# Patient Record
Sex: Female | Born: 1945 | Race: White | Hispanic: No | Smoking: Never smoker
Health system: Southern US, Community
[De-identification: ages and names within clinical notes are randomized; demographics above are authoritative.]

## PROBLEM LIST (undated history)

## (undated) DIAGNOSIS — F32A Depression, unspecified: Secondary | ICD-10-CM

## (undated) DIAGNOSIS — M8580 Other specified disorders of bone density and structure, unspecified site: Secondary | ICD-10-CM

## (undated) DIAGNOSIS — E079 Disorder of thyroid, unspecified: Secondary | ICD-10-CM

## (undated) DIAGNOSIS — J449 Chronic obstructive pulmonary disease, unspecified: Secondary | ICD-10-CM

## (undated) DIAGNOSIS — F329 Major depressive disorder, single episode, unspecified: Secondary | ICD-10-CM

## (undated) DIAGNOSIS — I10 Essential (primary) hypertension: Secondary | ICD-10-CM

## (undated) DIAGNOSIS — M797 Fibromyalgia: Secondary | ICD-10-CM

## (undated) DIAGNOSIS — K219 Gastro-esophageal reflux disease without esophagitis: Secondary | ICD-10-CM

## (undated) DIAGNOSIS — K5790 Diverticulosis of intestine, part unspecified, without perforation or abscess without bleeding: Secondary | ICD-10-CM

## (undated) DIAGNOSIS — T7840XA Allergy, unspecified, initial encounter: Secondary | ICD-10-CM

## (undated) DIAGNOSIS — E785 Hyperlipidemia, unspecified: Secondary | ICD-10-CM

## (undated) DIAGNOSIS — R002 Palpitations: Secondary | ICD-10-CM

## (undated) DIAGNOSIS — C50919 Malignant neoplasm of unspecified site of unspecified female breast: Secondary | ICD-10-CM

## (undated) DIAGNOSIS — M1990 Unspecified osteoarthritis, unspecified site: Secondary | ICD-10-CM

## (undated) DIAGNOSIS — F419 Anxiety disorder, unspecified: Secondary | ICD-10-CM

## (undated) HISTORY — DX: Disorder of thyroid, unspecified: E07.9

## (undated) HISTORY — DX: Allergy, unspecified, initial encounter: T78.40XA

## (undated) HISTORY — DX: Gastro-esophageal reflux disease without esophagitis: K21.9

## (undated) HISTORY — PX: APPENDECTOMY: SHX54

## (undated) HISTORY — DX: Depression, unspecified: F32.A

## (undated) HISTORY — DX: Unspecified osteoarthritis, unspecified site: M19.90

## (undated) HISTORY — DX: Hyperlipidemia, unspecified: E78.5

## (undated) HISTORY — PX: CATARACT EXTRACTION: SUR2

## (undated) HISTORY — DX: Major depressive disorder, single episode, unspecified: F32.9

## (undated) HISTORY — DX: Chronic obstructive pulmonary disease, unspecified: J44.9

## (undated) HISTORY — DX: Diverticulosis of intestine, part unspecified, without perforation or abscess without bleeding: K57.90

## (undated) HISTORY — DX: Fibromyalgia: M79.7

## (undated) HISTORY — DX: Essential (primary) hypertension: I10

## (undated) HISTORY — DX: Anxiety disorder, unspecified: F41.9

## (undated) HISTORY — PX: ABDOMINAL HYSTERECTOMY: SHX81

## (undated) HISTORY — DX: Other specified disorders of bone density and structure, unspecified site: M85.80

## (undated) HISTORY — DX: Malignant neoplasm of unspecified site of unspecified female breast: C50.919

---

## 1985-06-28 HISTORY — PX: MASTECTOMY: SHX3

## 2001-06-28 LAB — CONVERTED CEMR LAB

## 2004-06-28 LAB — HM COLONOSCOPY

## 2007-06-09 ENCOUNTER — Encounter: Payer: Self-pay | Admitting: Emergency Medicine

## 2007-06-10 ENCOUNTER — Inpatient Hospital Stay (HOSPITAL_COMMUNITY): Admission: EM | Admit: 2007-06-10 | Discharge: 2007-06-12 | Payer: Self-pay | Admitting: Internal Medicine

## 2007-06-10 ENCOUNTER — Ambulatory Visit: Payer: Self-pay | Admitting: Cardiovascular Disease

## 2007-06-12 ENCOUNTER — Encounter: Payer: Self-pay | Admitting: Internal Medicine

## 2007-07-17 ENCOUNTER — Ambulatory Visit: Payer: Self-pay | Admitting: Cardiology

## 2007-08-03 ENCOUNTER — Ambulatory Visit: Payer: Self-pay | Admitting: Cardiology

## 2007-08-03 ENCOUNTER — Ambulatory Visit: Payer: Self-pay

## 2007-08-03 LAB — CONVERTED CEMR LAB
ALT: 44 units/L — ABNORMAL HIGH (ref 0–35)
AST: 26 units/L (ref 0–37)
Total Protein: 6.5 g/dL (ref 6.0–8.3)

## 2007-09-14 ENCOUNTER — Ambulatory Visit: Payer: Self-pay | Admitting: Cardiology

## 2007-09-14 LAB — CONVERTED CEMR LAB
Chloride: 100 meq/L (ref 96–112)
Creatinine, Ser: 0.8 mg/dL (ref 0.4–1.2)
GFR calc Af Amer: 93 mL/min
GFR calc non Af Amer: 77 mL/min
Glucose, Bld: 100 mg/dL — ABNORMAL HIGH (ref 70–99)
Sodium: 140 meq/L (ref 135–145)

## 2007-09-22 ENCOUNTER — Ambulatory Visit: Payer: Self-pay | Admitting: Cardiology

## 2007-11-27 ENCOUNTER — Ambulatory Visit: Payer: Self-pay | Admitting: Internal Medicine

## 2007-11-27 DIAGNOSIS — Z853 Personal history of malignant neoplasm of breast: Secondary | ICD-10-CM | POA: Insufficient documentation

## 2007-11-27 DIAGNOSIS — I10 Essential (primary) hypertension: Secondary | ICD-10-CM | POA: Insufficient documentation

## 2007-11-27 DIAGNOSIS — K219 Gastro-esophageal reflux disease without esophagitis: Secondary | ICD-10-CM

## 2007-11-27 DIAGNOSIS — J309 Allergic rhinitis, unspecified: Secondary | ICD-10-CM | POA: Insufficient documentation

## 2007-11-28 LAB — CONVERTED CEMR LAB
ALT: 34 units/L (ref 0–35)
AST: 24 units/L (ref 0–37)
Albumin: 4 g/dL (ref 3.5–5.2)
Alkaline Phosphatase: 123 units/L — ABNORMAL HIGH (ref 39–117)
BUN: 11 mg/dL (ref 6–23)
Basophils Relative: 0.4 % (ref 0.0–1.0)
Cholesterol: 298 mg/dL (ref 0–200)
Direct LDL: 220 mg/dL
Eosinophils Absolute: 0.1 10*3/uL (ref 0.0–0.7)
Eosinophils Relative: 2.5 % (ref 0.0–5.0)
GFR calc non Af Amer: 77 mL/min
Glucose, Bld: 89 mg/dL (ref 70–99)
MCV: 90.7 fL (ref 78.0–100.0)
Monocytes Absolute: 0.4 10*3/uL (ref 0.1–1.0)
Neutrophils Relative %: 69.6 % (ref 43.0–77.0)
Platelets: 183 10*3/uL (ref 150–400)
Potassium: 3.8 meq/L (ref 3.5–5.1)
Total Bilirubin: 1.1 mg/dL (ref 0.3–1.2)
Total CHOL/HDL Ratio: 7.3
Total Protein: 6.9 g/dL (ref 6.0–8.3)
Triglycerides: 172 mg/dL — ABNORMAL HIGH (ref 0–149)
WBC: 4.5 10*3/uL (ref 4.5–10.5)

## 2008-01-30 ENCOUNTER — Ambulatory Visit: Payer: Self-pay | Admitting: Internal Medicine

## 2008-01-30 DIAGNOSIS — E785 Hyperlipidemia, unspecified: Secondary | ICD-10-CM

## 2008-01-30 DIAGNOSIS — F329 Major depressive disorder, single episode, unspecified: Secondary | ICD-10-CM

## 2008-02-02 LAB — CONVERTED CEMR LAB
ALT: 30 units/L (ref 0–35)
AST: 24 units/L (ref 0–37)
Albumin: 3.9 g/dL (ref 3.5–5.2)
Alkaline Phosphatase: 122 units/L — ABNORMAL HIGH (ref 39–117)
Bilirubin, Direct: 0.1 mg/dL (ref 0.0–0.3)
Cholesterol: 316 mg/dL (ref 0–200)
Direct LDL: 210.5 mg/dL
HDL: 43.9 mg/dL (ref >39.0)
Total Bilirubin: 0.9 mg/dL (ref 0.3–1.2)
Total CHOL/HDL Ratio: 7.2
Total Protein: 6.5 g/dL (ref 6.0–8.3)
Triglycerides: 307 mg/dL (ref 0–149)
VLDL: 61 mg/dL — ABNORMAL HIGH (ref 0–40)

## 2008-06-05 ENCOUNTER — Ambulatory Visit: Payer: Self-pay | Admitting: Internal Medicine

## 2008-06-12 LAB — CONVERTED CEMR LAB
Basophils Absolute: 0.1 10*3/uL (ref 0.0–0.1)
Eosinophils Absolute: 0.1 10*3/uL (ref 0.0–0.7)
Eosinophils Relative: 1.2 % (ref 0.0–5.0)
GFR calc Af Amer: 109 mL/min
Glucose, Bld: 94 mg/dL (ref 70–99)
Lymphocytes Relative: 15.3 % (ref 12.0–46.0)
MCHC: 36 g/dL (ref 30.0–36.0)
MCV: 89.8 fL (ref 78.0–100.0)
Monocytes Absolute: 0.5 10*3/uL (ref 0.1–1.0)
Neutro Abs: 3.9 10*3/uL (ref 1.4–7.7)
Neutrophils Relative %: 72.8 % (ref 43.0–77.0)
Platelets: 130 10*3/uL — ABNORMAL LOW (ref 150–400)
WBC: 5.4 10*3/uL (ref 4.5–10.5)

## 2008-08-19 ENCOUNTER — Ambulatory Visit: Payer: Self-pay | Admitting: Internal Medicine

## 2008-08-21 LAB — CONVERTED CEMR LAB
Albumin: 4 g/dL (ref 3.5–5.2)
Alkaline Phosphatase: 106 units/L (ref 39–117)
CO2: 27 meq/L (ref 19–32)
Cholesterol: 283 mg/dL (ref 0–200)
Creatinine, Ser: 0.7 mg/dL (ref 0.4–1.2)
Direct LDL: 192.5 mg/dL
GFR calc Af Amer: 108 mL/min
Glucose, Bld: 89 mg/dL (ref 70–99)
HDL: 41.6 mg/dL (ref >39.0)
Potassium: 3.2 meq/L — ABNORMAL LOW (ref 3.5–5.1)
Sodium: 140 meq/L (ref 135–145)
Total Bilirubin: 1 mg/dL (ref 0.3–1.2)
Total CHOL/HDL Ratio: 6.8

## 2008-11-05 ENCOUNTER — Encounter: Payer: Self-pay | Admitting: Internal Medicine

## 2009-01-02 ENCOUNTER — Ambulatory Visit: Payer: Self-pay | Admitting: Internal Medicine

## 2009-01-03 LAB — CONVERTED CEMR LAB
ALT: 57 units/L — ABNORMAL HIGH (ref 0–35)
CO2: 27 meq/L (ref 19–32)
Calcium: 8.9 mg/dL (ref 8.4–10.5)
Chloride: 105 meq/L (ref 96–112)
Eosinophils Relative: 2.2 % (ref 0.0–5.0)
Glucose, Bld: 83 mg/dL (ref 70–99)
HCT: 41.3 % (ref 36.0–46.0)
HDL: 45.5 mg/dL (ref >39.00)
Hemoglobin: 14.5 g/dL (ref 12.0–15.0)
Lymphocytes Relative: 15.6 % (ref 12.0–46.0)
Lymphs Abs: 0.8 10*3/uL (ref 0.7–4.0)
Monocytes Relative: 9.5 % (ref 3.0–12.0)
Platelets: 149 10*3/uL — ABNORMAL LOW (ref 150.0–400.0)
Sodium: 140 meq/L (ref 135–145)
TSH: 2.02 microintl units/mL (ref 0.35–5.50)
Total Bilirubin: 1 mg/dL (ref 0.3–1.2)
Total CHOL/HDL Ratio: 7
Triglycerides: 363 mg/dL — ABNORMAL HIGH (ref 0.0–149.0)
WBC: 5.2 10*3/uL (ref 4.5–10.5)

## 2009-02-12 ENCOUNTER — Ambulatory Visit: Payer: Self-pay | Admitting: Gastroenterology

## 2009-02-18 ENCOUNTER — Telehealth: Payer: Self-pay | Admitting: Gastroenterology

## 2009-02-26 ENCOUNTER — Ambulatory Visit: Payer: Self-pay | Admitting: Gastroenterology

## 2009-05-28 ENCOUNTER — Ambulatory Visit: Payer: Self-pay | Admitting: Internal Medicine

## 2009-06-03 ENCOUNTER — Telehealth (INDEPENDENT_AMBULATORY_CARE_PROVIDER_SITE_OTHER): Payer: Self-pay | Admitting: *Deleted

## 2009-06-04 ENCOUNTER — Encounter (HOSPITAL_COMMUNITY): Admission: RE | Admit: 2009-06-04 | Discharge: 2009-07-29 | Payer: Self-pay | Admitting: Internal Medicine

## 2009-06-04 ENCOUNTER — Ambulatory Visit: Payer: Self-pay | Admitting: Cardiology

## 2009-06-04 ENCOUNTER — Ambulatory Visit: Payer: Self-pay

## 2009-06-25 ENCOUNTER — Ambulatory Visit: Payer: Self-pay | Admitting: Internal Medicine

## 2009-06-26 LAB — CONVERTED CEMR LAB
AST: 24 units/L (ref 0–37)
Albumin: 4 g/dL (ref 3.5–5.2)
CO2: 29 meq/L (ref 19–32)
Chloride: 104 meq/L (ref 96–112)
Direct LDL: 176.4 mg/dL
Glucose, Bld: 87 mg/dL (ref 70–99)
HDL: 42.6 mg/dL (ref >39.00)
Potassium: 3.2 meq/L — ABNORMAL LOW (ref 3.5–5.1)
Sodium: 142 meq/L (ref 135–145)
Total Bilirubin: 0.8 mg/dL (ref 0.3–1.2)
Total CHOL/HDL Ratio: 6
Triglycerides: 337 mg/dL — ABNORMAL HIGH (ref 0.0–149.0)
VLDL: 67.4 mg/dL — ABNORMAL HIGH (ref 0.0–40.0)

## 2009-08-08 ENCOUNTER — Ambulatory Visit: Payer: Self-pay | Admitting: Internal Medicine

## 2009-08-08 LAB — CONVERTED CEMR LAB
Albumin: 3.8 g/dL (ref 3.5–5.2)
Cholesterol: 217 mg/dL — ABNORMAL HIGH (ref 0–200)
Direct LDL: 122.6 mg/dL
Total CHOL/HDL Ratio: 4
Total Protein: 6.5 g/dL (ref 6.0–8.3)
Triglycerides: 279 mg/dL — ABNORMAL HIGH (ref 0.0–149.0)
VLDL: 55.8 mg/dL — ABNORMAL HIGH (ref 0.0–40.0)

## 2009-10-31 ENCOUNTER — Ambulatory Visit: Payer: Self-pay | Admitting: Internal Medicine

## 2009-10-31 DIAGNOSIS — J4489 Other specified chronic obstructive pulmonary disease: Secondary | ICD-10-CM | POA: Insufficient documentation

## 2009-10-31 DIAGNOSIS — J449 Chronic obstructive pulmonary disease, unspecified: Secondary | ICD-10-CM

## 2009-10-31 LAB — CONVERTED CEMR LAB
ALT: 20 units/L (ref 0–35)
AST: 19 units/L (ref 0–37)
Basophils Relative: 0.8 % (ref 0.0–3.0)
Bilirubin, Direct: 0.1 mg/dL (ref 0.0–0.3)
Calcium: 9.4 mg/dL (ref 8.4–10.5)
Chloride: 103 meq/L (ref 96–112)
Creatinine, Ser: 0.7 mg/dL (ref 0.4–1.2)
Eosinophils Absolute: 0.1 10*3/uL (ref 0.0–0.7)
Eosinophils Relative: 1.9 % (ref 0.0–5.0)
GFR calc non Af Amer: 87.75 mL/min (ref >60)
HCT: 40.2 % (ref 36.0–46.0)
Lymphs Abs: 0.8 10*3/uL (ref 0.7–4.0)
MCHC: 34.8 g/dL (ref 30.0–36.0)
MCV: 91.4 fL (ref 78.0–100.0)
Monocytes Absolute: 0.5 10*3/uL (ref 0.1–1.0)
Neutrophils Relative %: 75.2 % (ref 43.0–77.0)
Potassium: 3.3 meq/L — ABNORMAL LOW (ref 3.5–5.1)
RBC: 4.39 M/uL (ref 3.87–5.11)
Total Bilirubin: 0.7 mg/dL (ref 0.3–1.2)
WBC: 5.9 10*3/uL (ref 4.5–10.5)

## 2009-11-20 ENCOUNTER — Ambulatory Visit: Payer: Self-pay | Admitting: Internal Medicine

## 2009-11-21 ENCOUNTER — Encounter: Payer: Self-pay | Admitting: Internal Medicine

## 2009-12-02 ENCOUNTER — Telehealth: Payer: Self-pay | Admitting: Internal Medicine

## 2009-12-02 ENCOUNTER — Ambulatory Visit: Payer: Self-pay | Admitting: Internal Medicine

## 2009-12-04 ENCOUNTER — Telehealth: Payer: Self-pay | Admitting: Internal Medicine

## 2009-12-22 ENCOUNTER — Telehealth: Payer: Self-pay | Admitting: Internal Medicine

## 2009-12-25 ENCOUNTER — Telehealth: Payer: Self-pay | Admitting: Internal Medicine

## 2009-12-30 ENCOUNTER — Ambulatory Visit: Payer: Self-pay | Admitting: Internal Medicine

## 2010-01-01 ENCOUNTER — Encounter (INDEPENDENT_AMBULATORY_CARE_PROVIDER_SITE_OTHER): Payer: Self-pay | Admitting: *Deleted

## 2010-02-05 ENCOUNTER — Ambulatory Visit: Payer: Self-pay | Admitting: Gastroenterology

## 2010-02-05 ENCOUNTER — Encounter (INDEPENDENT_AMBULATORY_CARE_PROVIDER_SITE_OTHER): Payer: Self-pay | Admitting: *Deleted

## 2010-02-05 ENCOUNTER — Telehealth: Payer: Self-pay | Admitting: Gastroenterology

## 2010-02-05 DIAGNOSIS — K5730 Diverticulosis of large intestine without perforation or abscess without bleeding: Secondary | ICD-10-CM | POA: Insufficient documentation

## 2010-02-06 ENCOUNTER — Encounter (INDEPENDENT_AMBULATORY_CARE_PROVIDER_SITE_OTHER): Payer: Self-pay | Admitting: *Deleted

## 2010-02-06 ENCOUNTER — Emergency Department (HOSPITAL_COMMUNITY): Admission: EM | Admit: 2010-02-06 | Discharge: 2010-02-06 | Payer: Self-pay | Admitting: Emergency Medicine

## 2010-02-16 ENCOUNTER — Telehealth: Payer: Self-pay | Admitting: Gastroenterology

## 2010-02-25 ENCOUNTER — Telehealth: Payer: Self-pay | Admitting: Gastroenterology

## 2010-02-27 ENCOUNTER — Ambulatory Visit: Payer: Self-pay | Admitting: Gastroenterology

## 2010-03-04 ENCOUNTER — Encounter: Payer: Self-pay | Admitting: Gastroenterology

## 2010-04-03 ENCOUNTER — Ambulatory Visit: Payer: Self-pay | Admitting: Internal Medicine

## 2010-04-07 LAB — CONVERTED CEMR LAB
ALT: 23 units/L (ref 0–35)
AST: 23 units/L (ref 0–37)
Albumin: 4 g/dL (ref 3.5–5.2)
Alkaline Phosphatase: 103 units/L (ref 39–117)
BUN: 13 mg/dL (ref 6–23)
Basophils Absolute: 0 10*3/uL (ref 0.0–0.1)
Basophils Relative: 0.4 % (ref 0.0–3.0)
Bilirubin, Direct: 0.1 mg/dL (ref 0.0–0.3)
CO2: 28 meq/L (ref 19–32)
Calcium: 9.4 mg/dL (ref 8.4–10.5)
Chloride: 106 meq/L (ref 96–112)
Cholesterol: 275 mg/dL — ABNORMAL HIGH (ref 0–200)
Creatinine, Ser: 0.8 mg/dL (ref 0.4–1.2)
Direct LDL: 174.8 mg/dL
Eosinophils Absolute: 0.1 10*3/uL (ref 0.0–0.7)
Eosinophils Relative: 1.5 % (ref 0.0–5.0)
GFR calc non Af Amer: 81.01 mL/min (ref >60)
Glucose, Bld: 104 mg/dL — ABNORMAL HIGH (ref 70–99)
HCT: 40 % (ref 36.0–46.0)
HDL: 42.5 mg/dL (ref >39.00)
Hemoglobin: 13.8 g/dL (ref 12.0–15.0)
Lymphocytes Relative: 15.7 % (ref 12.0–46.0)
Lymphs Abs: 0.9 10*3/uL (ref 0.7–4.0)
MCHC: 34.5 g/dL (ref 30.0–36.0)
MCV: 91.5 fL (ref 78.0–100.0)
Monocytes Absolute: 0.5 10*3/uL (ref 0.1–1.0)
Monocytes Relative: 9.2 % (ref 3.0–12.0)
Neutro Abs: 4.3 10*3/uL (ref 1.4–7.7)
Neutrophils Relative %: 73.2 % (ref 43.0–77.0)
Platelets: 198 10*3/uL (ref 150.0–400.0)
Potassium: 3.3 meq/L — ABNORMAL LOW (ref 3.5–5.1)
RBC: 4.38 M/uL (ref 3.87–5.11)
RDW: 13.2 % (ref 11.5–14.6)
Sodium: 143 meq/L (ref 135–145)
TSH: 1.35 microintl units/mL (ref 0.35–5.50)
Total Bilirubin: 0.6 mg/dL (ref 0.3–1.2)
Total CHOL/HDL Ratio: 6
Total Protein: 6.4 g/dL (ref 6.0–8.3)
Triglycerides: 499 mg/dL — ABNORMAL HIGH (ref 0.0–149.0)
VLDL: 99.8 mg/dL — ABNORMAL HIGH (ref 0.0–40.0)
WBC: 5.9 10*3/uL (ref 4.5–10.5)

## 2010-05-13 ENCOUNTER — Ambulatory Visit: Payer: Self-pay | Admitting: Internal Medicine

## 2010-05-13 LAB — CONVERTED CEMR LAB
Bilirubin Urine: NEGATIVE
Ketones, urine, test strip: NEGATIVE
Nitrite: NEGATIVE
Protein, U semiquant: NEGATIVE

## 2010-05-14 LAB — CONVERTED CEMR LAB
ALT: 23 units/L (ref 0–35)
Alkaline Phosphatase: 108 units/L (ref 39–117)
Bilirubin, Direct: 0.1 mg/dL (ref 0.0–0.3)
Total CHOL/HDL Ratio: 4
Total Protein: 6.2 g/dL (ref 6.0–8.3)

## 2010-07-03 ENCOUNTER — Emergency Department (HOSPITAL_COMMUNITY)
Admission: EM | Admit: 2010-07-03 | Discharge: 2010-07-03 | Payer: Self-pay | Source: Home / Self Care | Admitting: Emergency Medicine

## 2010-07-28 NOTE — Letter (Signed)
Summary: Patient [facility] Biopsy Results  [facility]  [address] [place], [place] [address]   Phone: [phone]  Fax: [phone]        March 04, 2010 MRN: [id number]    [facility] [address] [place], [place]  [address]    Dear Ms. HUDGINS,  I am pleased to inform you that the biopsies taken during your recent endoscopic examination did not show any evidence of cancer upon pathologic examination.  Additional information/recommendations:  __No further action is needed at this time.  Please follow-up with      your primary care physician for your other healthcare needs.  __ Please call [phone] to schedule a return visit to review      your condition.  X__ Continue with the treatment plan as outlined on the day of your      exam.bIOPSY FOR H.PYLORI WAS NEGATIVE.  __ You should have a repeat endoscopic examination for this problem              in _ months/years.   Please call us if you are having persistent problems or have questions about your condition that have not been fully answered at this time.  Sincerely,  Mardella Layman MD [facility]  This letter has been electronically signed by your physician.  Appended Document: Patient Notice-Endo Biopsy Results letter mailed

## 2010-07-28 NOTE — Assessment & Plan Note (Signed)
Summary: CONCERNS W/ FATIGUE // RS/PT RSC/CJR   Vital Signs:  Patient profile:   65 year old female Weight:      165 pounds BMI:     27.56 Temp:     98.4 degrees F oral Pulse rate:   78 / minute Pulse rhythm:   regular Resp:     16 per minute BP sitting:   116 / 60  (left arm) Cuff size:   regular  Vitals Entered By: Gladis Riffle, RN (Oct 31, 2009 11:16 AM) CC: c/o fatigue and SOB--also gets shooting pain across headache at times, fasting to check cholesterol Is Patient Diabetic? No   CC:  c/o fatigue and SOB--also gets shooting pain across headache at times and fasting to check cholesterol.  History of Present Illness: c/o breathing trouble for a few months she has some DOE---one flight of steps. pt states she is not conscious of it but other s note that she might be breathing hard. Pt states that years ago a physician told her she had emphysema... used to be on inhalers. She denies chest pain  In addition she says she "tires" easily. She is easily fatigued---ongoing for months. sxs have gradually worsened---recently started a vitamin and feels some better.   Htn---tolerating meds     Preventive Screening-Counseling & Management  Alcohol-Tobacco     Smoking Status: never  Current Problems (verified): 1)  Depression  (ICD-311) 2)  Preventive Health Care  (ICD-V70.0) 3)  Fatigue  (ICD-780.79) 4)  Breast Cancer, Hx of  (ICD-V10.3) 5)  Gerd  (ICD-530.81) 6)  Hypertension  (ICD-401.9) 7)  Hyperlipidemia  (ICD-272.4) 8)  Allergic Rhinitis  (ICD-477.9)  Current Medications (verified): 1)  Amlodipine Besylate 10 Mg  Tabs (Amlodipine Besylate) .... Take 1 Tablet By Mouth Once A Day 2)  Paroxetine Hcl 40 Mg  Tabs (Paroxetine Hcl) .... One By Mouth Daily 3)  Hydrochlorothiazide 12.5 Mg  Tabs (Hydrochlorothiazide) .... Take 1 Tablet By Mouth Once A Day 4)  Zolpidem Tartrate 5 Mg  Tabs (Zolpidem Tartrate) .... Take 1 Tablet By Mouth At Bedtime 5)  Amitriptyline Hcl 10 Mg  Tabs  (Amitriptyline Hcl) .... Take Two At Bedtime 6)  Singulair 10 Mg  Tabs (Montelukast Sodium) .... Take 1 Tablet By Mouth Once A Day 7)  Fexofenadine Hcl 180 Mg  Tabs (Fexofenadine Hcl) .... Take 1 Tablet By Mouth Once A Day 8)  Alprazolam 0.5 Mg Tabs (Alprazolam) .... Take 1 Tablet By Mouth At Bedtime 9)  Nexium 40 Mg  Cpdr (Esomeprazole Magnesium) .... Take 1 Capsule By Mouth Once A Day 10)  Cozaar 100 Mg  Tabs (Losartan Potassium) .... Take 1 Tablet By Mouth Once A Day 11)  Multivitamins   Tabs (Multiple Vitamin) .... Once Daily 12)  Niacin Cr 500 Mg  Tbcr (Niacin) .... Take 2 Once Daily 13)  Fish Oil 1000 Mg  Caps (Omega-3 Fatty Acids) .... Take 3 Once Daily 14)  Hydrocodone-Acetaminophen 5-325 Mg Tabs (Hydrocodone-Acetaminophen) .... Take 1 Tablet By Mouth Once A Day As Needed Back Pain 15)  Crestor 10 Mg Tabs (Rosuvastatin Calcium) .... 1/2 Tab Q O Day 16)  Advair Diskus 100-50 Mcg/dose Aepb (Fluticasone-Salmeterol) .... Use Daily As Directed  Allergies: 1)  ! Sulfamethoxazole (Sulfamethoxazole) 2)  ! Pravachol 3)  ! * Statins  Past History:  Past Surgical History: Last updated: 06/25/2009 Hysterectomy Mastectomy-bilateral 1987---f/u reconstruction (x3---implant removal) recurrent breast ca 2006----radiation therapy.  Cataract extraction  Family History: Last updated: December 09, 2007 mother deceased esophageal CA  53 yo father deceased AAA 17 yo  Social History: Last updated: 11/27/2007 Retired---manufacturing plant Never Smoked Alcohol use-no Regular exercise-yes Married 2 kids---healthy except son with bipolar  Risk Factors: Exercise: yes (11/27/2007)  Risk Factors: Smoking Status: never (10/31/2009)  Past Medical History: fibromyalgia osteoarthritis Allergic rhinitis Hyperlipidemia---statin intolerance (transaminitis) Hypertension GERD Breast cancer, hx of Depression COPD   Impression & Recommendations:  Problem # 1:  FATIGUE  (ICD-780.79)  Orders: TLB-BMP (Basic Metabolic Panel-BMET) (80048-METABOL) TLB-Hepatic/Liver Function Pnl (80076-HEPATIC) TLB-CBC Platelet - w/Differential (85025-CBCD) TLB-TSH (Thyroid Stimulating Hormone) (84443-TSH) Venipuncture (23557)  Problem # 2:  DYSPNEA/SHORTNESS OF BREATH (ICD-786.09) suspect she has copd she is not compliant with advair---resume meds PFTS Her updated medication list for this problem includes:    Hydrochlorothiazide 12.5 Mg Tabs (Hydrochlorothiazide) .Marland Kitchen... Take 1 tablet by mouth once a day    Singulair 10 Mg Tabs (Montelukast sodium) .Marland Kitchen... Take 1 tablet by mouth once a day    Advair Diskus 100-50 Mcg/dose Aepb (Fluticasone-salmeterol) .Marland Kitchen... 1 puff two times a day  Orders: EKG w/ Interpretation (93000) Pulmonary Referral (Pulmonary)  Problem # 3:  HYPERTENSION (ICD-401.9)  Her updated medication list for this problem includes:    Amlodipine Besylate 10 Mg Tabs (Amlodipine besylate) .Marland Kitchen... Take 1 tablet by mouth once a day    Hydrochlorothiazide 12.5 Mg Tabs (Hydrochlorothiazide) .Marland Kitchen... Take 1 tablet by mouth once a day    Cozaar 100 Mg Tabs (Losartan potassium) .Marland Kitchen... Take 1 tablet by mouth once a day  Complete Medication List: 1)  Amlodipine Besylate 10 Mg Tabs (Amlodipine besylate) .... Take 1 tablet by mouth once a day 2)  Paroxetine Hcl 40 Mg Tabs (Paroxetine hcl) .... One by mouth daily 3)  Hydrochlorothiazide 12.5 Mg Tabs (Hydrochlorothiazide) .... Take 1 tablet by mouth once a day 4)  Zolpidem Tartrate 5 Mg Tabs (Zolpidem tartrate) .... Take 1 tablet by mouth at bedtime 5)  Amitriptyline Hcl 10 Mg Tabs (Amitriptyline hcl) .... Take two at bedtime 6)  Singulair 10 Mg Tabs (Montelukast sodium) .... Take 1 tablet by mouth once a day 7)  Fexofenadine Hcl 180 Mg Tabs (Fexofenadine hcl) .... Take 1 tablet by mouth once a day 8)  Alprazolam 0.5 Mg Tabs (Alprazolam) .... Take 1 tablet by mouth at bedtime 9)  Nexium 40 Mg Cpdr (Esomeprazole magnesium) ....  Take 1 capsule by mouth once a day 10)  Cozaar 100 Mg Tabs (Losartan potassium) .... Take 1 tablet by mouth once a day 11)  Multivitamins Tabs (Multiple vitamin) .... Once daily 12)  Niacin Cr 500 Mg Tbcr (Niacin) .... Take 2 once daily 13)  Fish Oil 1000 Mg Caps (Omega-3 fatty acids) .... Take 3 once daily 14)  Hydrocodone-acetaminophen 5-325 Mg Tabs (Hydrocodone-acetaminophen) .... Take 1 tablet by mouth once a day as needed back pain 15)  Crestor 10 Mg Tabs (Rosuvastatin calcium) .... 1/2 tab q o day 16)  Advair Diskus 100-50 Mcg/dose Aepb (Fluticasone-salmeterol) .Marland Kitchen.. 1 puff two times a day  Patient Instructions: 1)  Please schedule a follow-up appointment in 1 month. Prescriptions: ADVAIR DISKUS 100-50 MCG/DOSE AEPB (FLUTICASONE-SALMETEROL) 1 puff two times a day  #1 x 11   Entered and Authorized by:   Birdie Sons MD   Signed by:   Birdie Sons MD on 10/31/2009   Method used:   Electronically to        [facility]* (mail-order)       [address]  [address] [place], [place]  [address]       Ph: [phone]       Fax: [phone]   RxID:   [id number]

## 2010-07-28 NOTE — Letter (Signed)
Summary: Patient Notice- Polyp Results  [facility]  [address] [place], [place] [address]   Phone: [phone]  Fax: [phone]        March 04, 2010 MRN: [id number]    [facility] [address] [place], [place]  [address]    Dear Ms. HUDGINS,  I am pleased to inform you that the colon polyp(s) removed during your recent colonoscopy was (were) found to be benign (no cancer detected) upon pathologic examination.  I recommend you have a repeat colonoscopy examination in 5_ years to look for recurrent polyps, as having colon polyps increases your risk for having recurrent polyps or even colon cancer in the future.  Should you develop new or worsening symptoms of abdominal pain, bowel habit changes or bleeding from the rectum or bowels, please schedule an evaluation with either your primary care physician or with me.  Additional information/recommendations:  __ No further action with gastroenterology is needed at this time. Please      follow-up with your primary care physician for your other healthcare      needs.  __ Please call [phone] to schedule a return visit to review your      situation.  __ Please keep your follow-up visit as already scheduled.  _X_ Continue treatment plan as outlined the day of your exam.  Please call us if you are having persistent problems or have questions about your condition that have not been fully answered at this time.  Sincerely,  Mardella Layman MD [facility]  This letter has been electronically signed by your physician.

## 2010-07-28 NOTE — Assessment & Plan Note (Signed)
Summary: 1 MONTH F/U   Vital Signs:  Patient profile:   65 year old female Weight:      165 pounds BMI:     27.56 Pulse rate:   76 / minute Pulse rhythm:   regular Resp:     14 per minute BP sitting:   104 / 66  (left arm) Cuff size:   regular  Vitals Entered By: Gladis Riffle, RN (December 02, 2009 10:49 AM) CC: 1 month rov, results PFT Is Patient Diabetic? No   CC:  1 month rov and results PFT.  History of Present Illness:  Follow-Up Visit      This is a 65 year old woman who presents for Follow-up visit.  The patient denies chest pain and palpitations.  Since the last visit the patient notes no new problems or concerns.  The patient reports taking meds as prescribed.  When questioned about possible medication side effects, the patient notes none.    All other systems reviewed and were negative   Preventive Screening-Counseling & Management  Alcohol-Tobacco     Smoking Status: never  Current Problems (verified): 1)  COPD  (ICD-496) 2)  Depression  (ICD-311) 3)  Preventive Health Care  (ICD-V70.0) 4)  Breast Cancer, Hx of  (ICD-V10.3) 5)  Gerd  (ICD-530.81) 6)  Hypertension  (ICD-401.9) 7)  Hyperlipidemia  (ICD-272.4) 8)  Allergic Rhinitis  (ICD-477.9)  Current Medications (verified): 1)  Amlodipine Besylate 10 Mg  Tabs (Amlodipine Besylate) .... Take 1 Tablet By Mouth Once A Day 2)  Paroxetine Hcl 40 Mg  Tabs (Paroxetine Hcl) .... One By Mouth Daily 3)  Hydrochlorothiazide 12.5 Mg  Tabs (Hydrochlorothiazide) .... Take 1 Tablet By Mouth Once A Day 4)  Zolpidem Tartrate 5 Mg  Tabs (Zolpidem Tartrate) .... Take 1 Tablet By Mouth At Bedtime 5)  Amitriptyline Hcl 10 Mg  Tabs (Amitriptyline Hcl) .... Take Two At Bedtime 6)  Singulair 10 Mg  Tabs (Montelukast Sodium) .... Take 1 Tablet By Mouth Once A Day 7)  Fexofenadine Hcl 180 Mg  Tabs (Fexofenadine Hcl) .... Take 1 Tablet By Mouth Once A Day 8)  Alprazolam 0.5 Mg Tabs (Alprazolam) .... Take 1 Tablet By Mouth At Bedtime 9)   Nexium 40 Mg  Cpdr (Esomeprazole Magnesium) .... Take 1 Capsule By Mouth Once A Day 10)  Cozaar 100 Mg  Tabs (Losartan Potassium) .... Take 1 Tablet By Mouth Once A Day 11)  Multivitamins   Tabs (Multiple Vitamin) .... Once Daily 12)  Niacin Cr 500 Mg  Tbcr (Niacin) .... Take 2 Once Daily 13)  Fish Oil 1000 Mg  Caps (Omega-3 Fatty Acids) .... Take 3 Once Daily 14)  Hydrocodone-Acetaminophen 5-325 Mg Tabs (Hydrocodone-Acetaminophen) .... Take 1 Tablet By Mouth Once A Day As Needed Back Pain 15)  Crestor 10 Mg Tabs (Rosuvastatin Calcium) .... 1/2 Tab Q O Day 16)  Advair Diskus 100-50 Mcg/dose Aepb (Fluticasone-Salmeterol) .Marland Kitchen.. 1 Puff Two Times A Day 17)  K-Lor 20 Meq Pack (Potassium Chloride) .... One Tab Daily  Allergies: 1)  ! Sulfamethoxazole (Sulfamethoxazole) 2)  ! Pravachol 3)  ! 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   Head:  normocephalic and atraumatic.   Eyes:  pupils equal and pupils round.   Neck:  No deformities, masses, or tenderness noted. Chest Wall:  No deformities, masses, or  tenderness noted. Lungs:  normal respiratory effort and no intercostal retractions.   Heart:  Normal rate and regular rhythm. S1 and S2 normal without gallop, murmur, click, rub or other extra sounds. Abdomen:  soft and non-tender.   Msk:  No deformity or scoliosis noted of thoracic or lumbar spine.   Neurologic:  cranial nerves II-XII intact and gait normal.     Impression & Recommendations:  Problem # 1:  COPD (ICD-496) improved on meds she was a nonsmoker (alot of second hand smoke) continue current medications  risks of long acting beta agonists discussed Her updated medication list for this problem includes:    Singulair 10 Mg Tabs (Montelukast sodium) .Marland Kitchen... Take 1 tablet by mouth once a  day    Advair Diskus 100-50 Mcg/dose Aepb (Fluticasone-salmeterol) .Marland Kitchen... 1 puff two times a day  Problem # 2:  HYPERTENSION (ICD-401.9) controlled continue current medications  Her updated medication list for this problem includes:    Amlodipine Besylate 10 Mg Tabs (Amlodipine besylate) .Marland Kitchen... Take 1 tablet by mouth once a day    Hydrochlorothiazide 12.5 Mg Tabs (Hydrochlorothiazide) .Marland Kitchen... Take 1 tablet by mouth once a day    Cozaar 100 Mg Tabs (Losartan potassium) .Marland Kitchen... Take 1 tablet by mouth once a day  BP today: 104/66 Prior BP: 116/60 (10/31/2009)  Labs Reviewed: K+: 3.3 (10/31/2009) Creat: : 0.7 (10/31/2009)   Chol: 217 (08/08/2009)   HDL: 49.20 (08/08/2009)   LDL: DEL (08/19/2008)   TG: 279.0 (08/08/2009)  Problem # 3:  HYPERLIPIDEMIA (ICD-272.4) stay on same meds Her updated medication list for this problem includes:    Niacin Cr 500 Mg Tbcr (Niacin) .Marland Kitchen... Take 2 once daily    Crestor 10 Mg Tabs (Rosuvastatin calcium) .Marland Kitchen... 1/2 tab q o day  Labs Reviewed: SGOT: 19 (10/31/2009)   SGPT: 20 (10/31/2009)   HDL:49.20 (08/08/2009), 42.60 (06/25/2009)  LDL:DEL (08/19/2008), DEL (01/30/2008)  Chol:217 (08/08/2009), 272 (06/25/2009)  Trig:279.0 (08/08/2009), 337.0 (06/25/2009)  Complete Medication List: 1)  Amlodipine Besylate 10 Mg Tabs (Amlodipine besylate) .... Take 1 tablet by mouth once a day 2)  Paroxetine Hcl 40 Mg Tabs (Paroxetine hcl) .... One by mouth daily 3)  Hydrochlorothiazide 12.5 Mg Tabs (Hydrochlorothiazide) .... Take 1 tablet by mouth once a day 4)  Zolpidem Tartrate 5 Mg Tabs (Zolpidem tartrate) .... Take 1 tablet by mouth at bedtime 5)  Amitriptyline Hcl 10 Mg Tabs (Amitriptyline hcl) .... Take two at bedtime 6)  Singulair 10 Mg Tabs (Montelukast sodium) .... Take 1 tablet by mouth once a day 7)  Fexofenadine Hcl 180 Mg Tabs (Fexofenadine hcl) .... Take 1 tablet by mouth once a day 8)  Alprazolam 0.5 Mg Tabs (Alprazolam) .... Take 1 tablet by mouth at bedtime 9)   Nexium 40 Mg Cpdr (Esomeprazole magnesium) .... Take 1 capsule by mouth once a day 10)  Cozaar 100 Mg Tabs (Losartan potassium) .... Take 1 tablet by mouth once a day 11)  Multivitamins Tabs (Multiple vitamin) .... Once daily 12)  Niacin Cr 500 Mg Tbcr (Niacin) .... Take 2 once daily 13)  Fish Oil 1000 Mg Caps (Omega-3 fatty acids) .... Take 3 once daily 14)  Hydrocodone-acetaminophen 5-325 Mg Tabs (Hydrocodone-acetaminophen) .... Take 1 tablet by mouth once a day as needed back pain 15)  Crestor 10 Mg Tabs (Rosuvastatin calcium) .... 1/2 tab q o day 16)  Advair Diskus 100-50 Mcg/dose Aepb (Fluticasone-salmeterol) .Marland Kitchen.. 1 puff two times a day 17)  Potassium Chloride 20 Meq Pack (Potassium chloride) .... 20  meq tablets take 1 tablet by mouth once a day  Other Orders: Gastroenterology Referral (GI)  Patient Instructions: 1)  Please schedule a follow-up appointment in 1 month. Prescriptions: POTASSIUM CHLORIDE 20 MEQ PACK (POTASSIUM CHLORIDE) 20 meq tablets Take 1 tablet by mouth once a day  #90 x 3   Entered and Authorized by:   Birdie Sons MD   Signed by:   Birdie Sons MD on 12/02/2009   Method used:   Electronically to        [facility]* (mail-order)       [address] [place]  [address]       Ph: [phone]       Fax: [phone]   RxID:   [id number]

## 2010-07-28 NOTE — Letter (Signed)
Summary: OSP Based Product Consent Form  [facility]  [address] [place], [place] [address]   Phone: [phone]  Fax: [phone]      February 05, 2010 MRN: [id number] Gina Weber  [facility]  OSP BASED PRODUCT CONSENT FORM  I understand that in order to prepare myself for an examination of my large bowel during a colonoscpoy I must undergo a bowel cleansing prior to the procedure. This is to ensure that my bowel is as clean as possible so the physician performing the procedure can thoroughly examine the lining of my colon to detect polyps or other abnormalities.  I have beeen informed that the FDA has become aware of reports of acute phosphate nephropathy, a type of acute kidney injury, associated with the use of oral sodium phosphate based products (OPS) for bowel cleansing prior to colonoscopy and other procedures. These rare but serious events have occurred in some patients wothout identifible factors that would put them at risk for developing acute kidney injury. These products include the prescription products, OsmoPrep and Visicol, as well as previously available over-the-counter laxatives (e.g., Fleet Phospho-soda).  Prior caution should be taken in patients with a history of renal insufficiency, advanced age, or those taking diuretics or certain blood pressure medications. The use of OSP is contraindicated in patients with advanced renal disease or a history of heart failure.  Alternative preparations for bowel cleansing recommended by my physician include: MoviPrep, Golytely, HalfLytely, Miralax, or other PEG (polyethylene glycol) based products which, to date, have not been associated with these types od adverse events.  The above risks and alternatives have been fully expained to me and I am aware that by taking OsmoPrep, or like products, an acute kidney injury may occur without warning. Despite this warning, I have chosen to use a phosphate-based  regime for bowel cleansing prior to my colonoscopy and will assume responsibility for any adverse effect that I may experience from this product. I also understand the importance of vigorous hydration before and immediately following my procedure.     Instructed by:________________________________     Signed:________________________________________   Date:_______________   Original 08/29/07

## 2010-07-28 NOTE — Op Note (Signed)
Summary: Cardiac Catheteriztion  Cardiac Catheteriztion   Imported By: Maryln Gottron 12/11/2009 14:41:57  _____________________________________________________________________  External Attachment:    Type:   Image     Comment:   External Document

## 2010-07-28 NOTE — Miscellaneous (Signed)
Summary: Clotest  Clinical Lists Changes  Orders: Added new Test order of TLB-H Pylori Screen Gastric Biopsy (83013-CLOTEST) - Signed 

## 2010-07-28 NOTE — Progress Notes (Signed)
Summary: ? re prep  Phone Note Call from Patient Call back at Home Phone [phone]   Caller: Patient Call For: Dr Jarold Motto Reason for Call: Talk to Nurse Summary of Call: Patient wants to speak to nurse regarding prep before proc. Initial call taken by: Tawni Levy,  February 25, 2010 10:58 AM  Follow-up for Phone Call        Answered pt's questions re prep for colonoscopy. Follow-up by: Ashok Cordia RN,  February 25, 2010 11:12 AM

## 2010-07-28 NOTE — Miscellaneous (Signed)
Summary: Orders Update pft charges  Clinical Lists Changes  Orders: Added new Service order of Carbon Monoxide diffusing w/capacity (94720) - Signed Added new Service order of Lung Volumes (94240) - Signed Added new Service order of Spirometry (Pre & Post) (94060) - Signed 

## 2010-07-28 NOTE — Assessment & Plan Note (Signed)
Summary: 1 month rov/njr   Vital Signs:  Patient profile:   65 year old female Weight:      166 pounds Temp:     98.5 degrees F oral Pulse rate:   86 / minute Pulse rhythm:   regular Resp:     12 per minute BP sitting:   126 / 64  (left arm) Cuff size:   regular  Vitals Entered By: Gladis Riffle, RN (December 30, 2009 11:23 AM) CC: 1 month rov--states advair working if remembers to take bid  Is Patient Diabetic? No   CC:  1 month rov--states advair working if remembers to take bid .  History of Present Illness:  Follow-Up Visit      This is a 65 year old woman who presents for Follow-up visit.  The patient denies chest pain, palpitations, and SOB.  Since the last visit the patient notes no new problems or concerns.  The patient reports taking meds as prescribed.  When questioned about possible medication side effects, the patient notes none.   says Advair working well. complains of slight dysuria---no fever or back pain  All other systems reviewed and were negative   Preventive Screening-Counseling & Management  Alcohol-Tobacco     Smoking Status: never  Current Problems (verified): 1)  COPD  (ICD-496) 2)  Depression  (ICD-311) 3)  Preventive Health Care  (ICD-V70.0) 4)  Breast Cancer, Hx of  (ICD-V10.3) 5)  Gerd  (ICD-530.81) 6)  Hypertension  (ICD-401.9) 7)  Hyperlipidemia  (ICD-272.4) 8)  Allergic Rhinitis  (ICD-477.9)  Current Medications (verified): 1)  Amlodipine Besylate 10 Mg  Tabs (Amlodipine Besylate) .... Take 1 Tablet By Mouth Once A Day 2)  Paroxetine Hcl 40 Mg  Tabs (Paroxetine Hcl) .... One By Mouth Daily 3)  Hydrochlorothiazide 12.5 Mg  Tabs (Hydrochlorothiazide) .... Take 1 Tablet By Mouth Once A Day 4)  Zolpidem Tartrate 5 Mg  Tabs (Zolpidem Tartrate) .... Take 1 Tablet By Mouth At Bedtime 5)  Amitriptyline Hcl 10 Mg  Tabs (Amitriptyline Hcl) .... Take Two At Bedtime 6)  Singulair 10 Mg  Tabs (Montelukast Sodium) .... Take 1 Tablet By Mouth Once A Day 7)   Fexofenadine Hcl 180 Mg  Tabs (Fexofenadine Hcl) .... Take 1 Tablet By Mouth Once A Day 8)  Alprazolam 0.5 Mg Tabs (Alprazolam) .... Take 1 Tablet By Mouth At Bedtime 9)  Nexium 40 Mg  Cpdr (Esomeprazole Magnesium) .... Take 1 Capsule By Mouth Once A Day 10)  Cozaar 100 Mg  Tabs (Losartan Potassium) .... Take 1 Tablet By Mouth Once A Day 11)  Multivitamins   Tabs (Multiple Vitamin) .... Once Daily 12)  Niacin Cr 500 Mg  Tbcr (Niacin) .... Take 2 Once Daily 13)  Fish Oil 1000 Mg  Caps (Omega-3 Fatty Acids) .... Take 3 Once Daily 14)  Hydrocodone-Acetaminophen 5-325 Mg Tabs (Hydrocodone-Acetaminophen) .... Take 1 Tablet By Mouth Once A Day As Needed Back Pain 15)  Crestor 10 Mg Tabs (Rosuvastatin Calcium) .... 1/2 Tab Q O Day 16)  Advair Diskus 100-50 Mcg/dose Aepb (Fluticasone-Salmeterol) .Marland Kitchen.. 1 Puff Two Times A Day 17)  Klor-Con M20 20 Meq Cr-Tabs (Potassium Chloride Crys Cr) .... Take 1 Tablet By Mouth Once A Day 18)  B Complex Vitamins  Soln (B Complex Vitamins) .... One Dropperful Once Daily  Allergies: 1)  ! Sulfamethoxazole (Sulfamethoxazole) 2)  ! Pravachol 3)  ! * Statins  Past History:  Past Medical History: Last updated: 10/31/2009 fibromyalgia osteoarthritis Allergic rhinitis Hyperlipidemia---statin  intolerance (transaminitis) Hypertension GERD Breast cancer, hx of Depression COPD  Past Surgical History: Last updated: 06/25/2009 Hysterectomy Mastectomy-bilateral 1987---f/u reconstruction (x3---implant removal) recurrent breast ca 2006----radiation therapy.  Cataract extraction  Family History: Last updated: 12-16-07 mother deceased esophageal CA 28 yo father deceased AAA 35 yo  Social History: Last updated: 12/02/2009 Retired---manufacturing plant Never Smoked---long hx of 2nd hand smoke Alcohol use-no Regular exercise-yes Married 2 kids---healthy except son with bipolar  Risk Factors: Exercise: yes (12/16/07)  Risk Factors: Smoking Status:  never (12/30/2009)  Physical Exam  General:  alert and well-developed.   Head:  normocephalic and atraumatic.   Eyes:  pupils equal and pupils round.   Ears:  R ear normal and L ear normal.   Neck:  No deformities, masses, or tenderness noted. Chest Wall:  No deformities, masses, or tenderness noted. Lungs:  normal respiratory effort and no intercostal retractions.   Heart:  Normal rate and regular rhythm. S1 and S2 normal without gallop, murmur, click, rub or other extra sounds. Abdomen:  soft and non-tender.   Msk:  No deformity or scoliosis noted of thoracic or lumbar spine.   Neurologic:  cranial nerves II-XII intact and gait normal.   Skin:  turgor normal and color normal.   Psych:  normally interactive and good eye contact.     Impression & Recommendations:  Problem # 1:  COPD (ICD-496) sxs are improved Her updated medication list for this problem includes:    Singulair 10 Mg Tabs (Montelukast sodium) .Marland Kitchen... Take 1 tablet by mouth once a day    Advair Diskus 100-50 Mcg/dose Aepb (Fluticasone-salmeterol) .Marland Kitchen... 1 puff two times a day  Problem # 2:  DYSURIA (ICD-788.1)  Orders: Venipuncture ([address]) UA Dipstick w/o Micro (automated)  (81003) T-Culture, Urine (60454-09811)  Complete Medication List: 1)  Amlodipine Besylate 10 Mg Tabs (Amlodipine besylate) .... Take 1 tablet by mouth once a day 2)  Paroxetine Hcl 40 Mg Tabs (Paroxetine hcl) .... One by mouth daily 3)  Hydrochlorothiazide 12.5 Mg Tabs (Hydrochlorothiazide) .... Take 1 tablet by mouth once a day 4)  Zolpidem Tartrate 5 Mg Tabs (Zolpidem tartrate) .... Take 1 tablet by mouth at bedtime 5)  Amitriptyline Hcl 10 Mg Tabs (Amitriptyline hcl) .... Take two at bedtime 6)  Singulair 10 Mg Tabs (Montelukast sodium) .... Take 1 tablet by mouth once a day 7)  Fexofenadine Hcl 180 Mg Tabs (Fexofenadine hcl) .... Take 1 tablet by mouth once a day 8)  Alprazolam 0.5 Mg Tabs (Alprazolam) .... Take 1 tablet by mouth two times  a day as needed anxiety 9)  Nexium 40 Mg Cpdr (Esomeprazole magnesium) .... Take 1 capsule by mouth once a day 10)  Cozaar 100 Mg Tabs (Losartan potassium) .... Take 1 tablet by mouth once a day 11)  Multivitamins Tabs (Multiple vitamin) .... Once daily 12)  Niacin Cr 500 Mg Tbcr (Niacin) .... Take 2 once daily 13)  Fish Oil 1000 Mg Caps (Omega-3 fatty acids) .... Take 3 once daily 14)  Hydrocodone-acetaminophen 5-325 Mg Tabs (Hydrocodone-acetaminophen) .... Take 1 tablet by mouth once a day as needed back pain 15)  Crestor 10 Mg Tabs (Rosuvastatin calcium) .... 1/2 tab q o day 16)  Advair Diskus 100-50 Mcg/dose Aepb (Fluticasone-salmeterol) .Marland Kitchen.. 1 puff two times a day 17)  Klor-con M20 20 Meq Cr-tabs (Potassium chloride crys cr) .... Take 1 tablet by mouth once a day 18)  B Complex Vitamins Soln (B complex vitamins) .... One dropperful once daily  Patient Instructions: 1)  Please schedule a follow-up appointment in 3 months. Prescriptions: ALPRAZOLAM 0.5 MG TABS (ALPRAZOLAM) Take 1 tablet by mouth two times a day as needed anxiety  #40 x 3   Entered and Authorized by:   Birdie Sons MD   Signed by:   Birdie Sons MD on 12/30/2009   Method used:   Print then Give to Patient   RxID:   [id number]   Appended Document: 1 month rov/njr Pt did not have UA or urine culture done.  I called her and she says she had no paper for the lab so did not go.  she lives in [place], [place] so will not return at this time.  She thinks urine is just concentrated so has an odor.  Was advised to drink more fluids and substitute the tea she drinks with water and to call back if problem continues. She will do so.

## 2010-07-28 NOTE — Progress Notes (Signed)
Summary: [facility] refill  Phone Note Refill Request Message from:  Fax from Pharmacy on December 25, 2009 12:32 PM  Refills Requested: Medication #1:  ZOLPIDEM TARTRATE 5 MG  TABS Take 1 tablet by mouth at bedtime Initial call taken by: Kern Reap CMA Duncan Dull),  December 25, 2009 12:32 PM    Prescriptions: ZOLPIDEM TARTRATE 5 MG  TABS (ZOLPIDEM TARTRATE) Take 1 tablet by mouth at bedtime  #90 x 1   Entered by:   Kern Reap CMA (AAMA)   Authorized by:   Birdie Sons MD   Signed by:   Kern Reap CMA (AAMA) on 12/25/2009   Method used:   Historical   RxID:   [id number]

## 2010-07-28 NOTE — Assessment & Plan Note (Signed)
Summary: 3 MTH ROV // RS   Vital Signs:  Patient profile:   65 year old female Weight:      168 pounds Temp:     98.7 degrees F oral BP sitting:   130 / 62  (left arm) Cuff size:   regular  Vitals Entered By: Sid Falcon LPN (April 03, 2010 11:37 AM)  Primary Care Provider:  Birdie Sons, MD   History of Present Illness:  Follow-Up Visit      This is a Gina Weber who presents for Follow-up visit.  The patient denies chest pain and palpitations.  Since the last visit the patient notes no new problems or concerns.  The patient reports taking meds as prescribed.  When questioned about possible medication side effects, the patient notes none.    All other systems reviewed and were negative ( she is primary care giver for ill husband: CHF/cirrhosis)    Current Problems (verified): 1)  Diverticulosis of Colon  (ICD-562.10) 2)  COPD  (ICD-496) 3)  Depression  (ICD-311) 4)  Preventive Health Care  (ICD-V70.0) 5)  Breast Cancer, Hx of  (ICD-V10.3) 6)  Gerd  (ICD-530.81) 7)  Hypertension  (ICD-401.9) 8)  Hyperlipidemia  (ICD-272.4) 9)  Allergic Rhinitis  (ICD-477.9)  Current Medications (verified): 1)  Amlodipine Besylate 10 Mg  Tabs (Amlodipine Besylate) .... Take 1 Tablet By Mouth Once A Day 2)  Paroxetine Hcl 40 Mg  Tabs (Paroxetine Hcl) .... One By Mouth Daily 3)  Hydrochlorothiazide 12.5 Mg  Tabs (Hydrochlorothiazide) .... Take 1 Tablet By Mouth Once A Day 4)  Amitriptyline Hcl 10 Mg  Tabs (Amitriptyline Hcl) .... Take Two At Bedtime 5)  Singulair 10 Mg  Tabs (Montelukast Sodium) .... Take 1 Tablet By Mouth Once A Day 6)  Alprazolam 0.5 Mg Tabs (Alprazolam) .... Take 1 Tablet By Mouth Two Times A Day As Needed Anxiety 7)  Nexium 40 Mg  Cpdr (Esomeprazole Magnesium) .... Take 1 Capsule By Mouth Once A Day 8)  Cozaar 100 Mg  Tabs (Losartan Potassium) .... Take 1 Tablet By Mouth Once A Day 9)  Multivitamins   Tabs (Multiple Vitamin) .... Once Daily 10)  Niacin Cr 500  Mg  Tbcr (Niacin) .... Take 2 Once Daily 11)  Fish Oil 1000 Mg  Caps (Omega-3 Fatty Acids) .... Take 3 Once Daily 12)  Hydrocodone-Acetaminophen 5-325 Mg Tabs (Hydrocodone-Acetaminophen) .... Take 1 Tablet By Mouth Once A Day As Needed Back Pain 13)  Advair Diskus 100-50 Mcg/dose Aepb (Fluticasone-Salmeterol) .Marland Kitchen.. 1 Puff Two Times A Day 14)  B Complex Vitamins  Soln (B Complex Vitamins) .... One Dropperful Once Daily 15)  Vitamin D3 1000 Unit Caps (Cholecalciferol) .... Take 3 Capsules By Mouth Once Daily 16)  Tramadol Hcl 50 Mg Tabs (Tramadol Hcl) .... Take 1 Tablet By Mouth Every 6-8 Hours As Needed For Pain 17)  Phillips Colon Health  Caps (Probiotic Product) .... Take 1 Capsule By Mouth Two Times A Day  Allergies: 1)  ! Sulfamethoxazole (Sulfamethoxazole) 2)  ! Pravachol 3)  ! * Statins  Past History:  Past Medical History: Last updated: February 08, 2010 fibromyalgia osteoarthritis Allergic rhinitis Hyperlipidemia---statin intolerance (transaminitis) Hypertension GERD Breast cancer, hx of Depression COPD Anxiety arthritis diverticulosis  Past Surgical History: Last updated: 02-08-10 Hysterectomy Mastectomy-bilateral 1987---f/u reconstruction (x3---implant removal) recurrent breast ca 2006----radiation therapy.  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   Head:  normocephalic and atraumatic.   Eyes:   pupils equal and pupils round.   Ears:  R ear normal and L ear normal.   Neck:  No deformities, masses, or tenderness noted. Chest Wall:  No deformities, masses, or tenderness noted. Lungs:  normal respiratory effort and no intercostal retractions.   Abdomen:  soft and non-tender.   Msk:  No deformity or scoliosis noted of thoracic or lumbar spine.   Neurologic:  cranial nerves II-XII intact and gait normal.     Impression & Recommendations:  Problem # 1:  HYPERTENSION (ICD-401.9) adequate control continue current medications  Her updated medication list for this problem includes:    Amlodipine Besylate 10 Mg Tabs (Amlodipine besylate) .Marland Kitchen... Take 1 tablet by mouth once a day    Hydrochlorothiazide 12.5 Mg Tabs (Hydrochlorothiazide) .Marland Kitchen... Take 1 tablet by mouth once a day    Cozaar 100 Mg Tabs (Losartan potassium) .Marland Kitchen... Take 1 tablet by mouth once a day  BP today: 130/62 Prior BP: 104/Gina (02/05/2010)  Labs Reviewed: K+: 3.3 (10/31/2009) Creat: : 0.7 (10/31/2009)   Chol: 217 (08/08/2009)   HDL: 49.20 (08/08/2009)   LDL: DEL (08/19/2008)   TG: 279.0 (08/08/2009)  Orders: Venipuncture ([address]) TLB-BMP (Basic Metabolic Panel-BMET) (80048-METABOL) Specimen Handling (60454)  Problem # 2:  HYPERLIPIDEMIA (ICD-272.4) unable to tolerate statins .ddm  Her updated medication list for this problem includes:    Niacin Cr 500 Mg Tbcr (Niacin) .Marland Kitchen... Take 2 once daily  Labs Reviewed: SGOT: 19 (10/31/2009)   SGPT: 20 (10/31/2009)   HDL:49.20 (08/08/2009), 42.60 (06/25/2009)  LDL:DEL (08/19/2008), DEL (01/30/2008)  Chol:217 (08/08/2009), 272 (06/25/2009)  Trig:279.0 (08/08/2009), 337.0 (06/25/2009)  Orders: TLB-Lipid Panel (80061-LIPID) TLB-Hepatic/Liver Function Pnl (80076-HEPATIC) TLB-TSH (Thyroid Stimulating Hormone) (84443-TSH) Specimen Handling (09811)  Problem # 3:  BREAST CANCER, HX OF (ICD-V10.3) no recurrence  Problem # 4:  COPD (ICD-496)  controlled continue current  medications  Her updated medication list for this problem includes:    Singulair 10 Mg Tabs (Montelukast sodium) .Marland Kitchen... Take 1 tablet by mouth once a day    Advair Diskus 100-50 Mcg/dose Aepb (Fluticasone-salmeterol) .Marland Kitchen... 1 puff two times a day  Vaccines Reviewed: Flu Vax: Fluvax 3+ (04/03/2010)  Orders: TLB-CBC Platelet - w/Differential (85025-CBCD) Specimen Handling (91478)  Complete Medication List: 1)  Amlodipine Besylate 10 Mg Tabs (Amlodipine besylate) .... Take 1 tablet by mouth once a day 2)  Paroxetine Hcl 40 Mg Tabs (Paroxetine hcl) .... One by mouth daily 3)  Hydrochlorothiazide 12.5 Mg Tabs (Hydrochlorothiazide) .... Take 1 tablet by mouth once a day 4)  Amitriptyline Hcl 10 Mg Tabs (Amitriptyline hcl) .... Take two at bedtime 5)  Singulair 10 Mg Tabs (Montelukast sodium) .... Take 1 tablet by mouth once a day 6)  Alprazolam 0.5 Mg Tabs (Alprazolam) .... Take 1 tablet by mouth two times a day as needed anxiety 7)  Nexium 40 Mg Cpdr (Esomeprazole magnesium) .... Take 1 capsule by mouth once a day 8)  Cozaar 100 Mg Tabs (Losartan potassium) .... Take 1 tablet by mouth once a day 9)  Multivitamins Tabs (Multiple vitamin) .... Once daily  10)  Niacin Cr 500 Mg Tbcr (Niacin) .... Take 2 once daily 11)  Fish Oil 1000 Mg Caps (Omega-3 fatty acids) .... Take 3 once daily 12)  Hydrocodone-acetaminophen 5-325 Mg Tabs (Hydrocodone-acetaminophen) .... Take 1 tablet by mouth once a day as needed back pain 13)  Advair Diskus 100-50 Mcg/dose Aepb (Fluticasone-salmeterol) .Marland Kitchen.. 1 puff two times a day 14)  B Complex Vitamins Soln (B complex vitamins) .... One dropperful once daily 15)  Vitamin D3 1000 Unit Caps (Cholecalciferol) .... Take 3 capsules by mouth once daily 16)  Tramadol Hcl 50 Mg Tabs (Tramadol hcl) .... Take 1 tablet by mouth every 6-8 hours as needed for pain 17)  Phillips Colon Health Caps (Probiotic product) .... Take 1 capsule by mouth two times a day  Other Orders: Flu  Vaccine 87yrs + MEDICARE PATIENTS (J5009) Administration Flu vaccine - MCR (F8182)  Patient Instructions: 1)  Please schedule a follow-up appointment in 4 months. Prescriptions: ADVAIR DISKUS 100-50 MCG/DOSE AEPB (FLUTICASONE-SALMETEROL) 1 puff two times a day  #3 x 3   Entered and Authorized by:   Birdie Sons MD   Signed by:   Birdie Sons MD on 04/03/2010   Method used:   Print then Give to Patient   RxID:   [id number] COZAAR 100 MG  TABS (LOSARTAN POTASSIUM) Take 1 tablet by mouth once a day  #90 Tablet x 3   Entered and Authorized by:   Birdie Sons MD   Signed by:   Birdie Sons MD on 04/03/2010   Method used:   Print then Give to Patient   RxID:   [id number] NEXIUM 40 MG  CPDR (ESOMEPRAZOLE MAGNESIUM) Take 1 capsule by mouth once a day  #90 Capsule x 3   Entered and Authorized by:   Birdie Sons MD   Signed by:   Birdie Sons MD on 04/03/2010   Method used:   Print then Give to Patient   RxID:   [id number] SINGULAIR 10 MG  TABS (MONTELUKAST SODIUM) Take 1 tablet by mouth once a day  #90 Tablet x 3   Entered and Authorized by:   Birdie Sons MD   Signed by:   Birdie Sons MD on 04/03/2010   Method used:   Print then Give to Patient   RxID:   [id number] AMITRIPTYLINE HCL 10 MG  TABS (AMITRIPTYLINE HCL) take two at bedtime  #180 x 3   Entered and Authorized by:   Birdie Sons MD   Signed by:   Birdie Sons MD on 04/03/2010   Method used:   Print then Give to Patient   RxID:   [id number] HYDROCHLOROTHIAZIDE 12.5 MG  TABS (HYDROCHLOROTHIAZIDE) Take 1 tablet by mouth once a day  #90 x 3   Entered and Authorized by:   Birdie Sons MD   Signed by:   Birdie Sons MD on 04/03/2010   Method used:   Print then Give to Patient   RxID:   [id number] PAROXETINE HCL 40 MG  TABS (PAROXETINE HCL) one by mouth daily  #90 Tablet x 3   Entered and Authorized by:   Birdie Sons MD   Signed by:   Birdie Sons MD on 04/03/2010   Method used:   Print then Give  to Patient   RxID:   [id number] AMLODIPINE BESYLATE 10 MG  TABS (AMLODIPINE BESYLATE) Take 1 tablet by mouth once a day  #90 Tablet x 3   Entered and Authorized by:   Birdie Sons MD   Signed  by:   Birdie Sons MD on 04/03/2010   Method used:   Print then Give to Patient   RxID:   [id number]   Flu Vaccine Consent Questions     Do you have a history of severe allergic reactions to this vaccine? no    Any prior history of allergic reactions to egg and/or gelatin? no    Do you have a sensitivity to the preservative Thimersol? no    Do you have a past history of Guillan-Barre Syndrome? no    Do you currently have an acute febrile illness? no    Have you ever had a severe reaction to latex? no    Vaccine information given and explained to patient? yes    Are you currently pregnant? no    Lot Number:[id number]   Exp Date:12/26/2010   Site Given  Left Deltoid IMdflu

## 2010-07-28 NOTE — Progress Notes (Signed)
Summary: Questions about Procedure Appt.  Phone Note Call from Patient Call back at Home Phone [phone]   Caller: Patient Call For: Dr. Jarold Motto Reason for Call: Talk to Nurse Summary of Call: pt. has a ENDO/COL sch'd for 02-18-10 and wants to r/s. Made her aware that there maybe a cancelation fee? She does not have the funds/gas money to travel from [place]. Would like to know if she would be charged the cancelation fee before she reschedule appt. Initial call taken by: Karna Christmas,  February 16, 2010 8:39 AM  Follow-up for Phone Call        Pt is scheduled for endo/colon on Wed. 8/24.  Pt needs to reschedule because her husband is sick,  He has CHF.  He will not be able to drive her to the appt.  Pt concerned about the cancellation fee.  Asking if she will be charged this fee?    Follow-up by: Ashok Cordia RN,  February 16, 2010 9:04 AM  Additional Follow-up for Phone Call Additional follow up Details #1::        no Additional Follow-up by: Mardella Layman MD Clementeen Graham,  February 16, 2010 9:13 AM    Additional Follow-up for Phone Call Additional follow up Details #2::    Appt rescheduled to 02/27/10.  Pt nofiied.  Do not bill pt. Per Dr Jarold Motto. Follow-up by: Ashok Cordia RN,  February 16, 2010 9:27 AM

## 2010-07-28 NOTE — Letter (Signed)
Summary: [facility] Instructions  [facility]  [address] [place], [place] [address]   Phone: [phone]  Fax: [phone]       Gina Weber    [birth year]    MRN: [id number]       Procedure Day Dorna Bloom: Wednesday, 02/18/10     Arrival Time: 9:30     Procedure Time: 10:30     Location of Procedure:                    _X _  [facility] (4th Floor)     PREPARATION FOR COLONOSCOPY WITH MIRALAX  Starting 5 days prior to your procedure 02/13/10 do not eat nuts, seeds, popcorn, corn, beans, peas,  salads, or any raw vegetables.  Do not take any fiber supplements (e.g. Metamucil, Citrucel, and Benefiber). ____________________________________________________________________________________________________   THE DAY BEFORE YOUR PROCEDURE         DATE: 02/17/10   DAY: Tuesday  1   Drink clear liquids the entire day-NO SOLID FOOD  2   Do not drink anything colored red or purple.  Avoid juices with pulp.  No orange juice.  3   Drink at least 64 oz. (8 glasses) of fluid/clear liquids during the day to prevent dehydration and help the prep work efficiently.  CLEAR LIQUIDS INCLUDE: Water Jello Ice Popsicles Tea (sugar ok, no milk/cream) Powdered fruit flavored drinks Coffee (sugar ok, no milk/cream) Gatorade Juice: apple, white grape, white cranberry  Lemonade Clear bullion, consomm, broth Carbonated beverages (any kind) Strained chicken noodle soup Hard Candy  4   Mix the entire bottle of Miralax with 64 oz. of Gatorade/Powerade in the morning and put in the refrigerator to chill.  5   At 3:00 pm take 2 Dulcolax/Bisacodyl tablets.  6   At 4:30 pm take one Reglan/Metoclopramide tablet.  7  Starting at 5:00 pm drink one 8 oz glass of the Miralax mixture every 15-20 minutes until you have finished drinking the entire 64 oz.  You should finish drinking prep around 7:30 or 8:00 pm.  8   If you are nauseated, you may take the 2nd Reglan/Metoclopramide  tablet at 6:30 pm.        9    At 8:00 pm take 2 more DULCOLAX/Bisacodyl tablets.     THE DAY OF YOUR PROCEDURE      DATE:  02/18/10    DAY: Wednesday  You may drink clear liquids until 8:30  (2 HOURS BEFORE PROCEDURE).   MEDICATION INSTRUCTIONS  Unless otherwise instructed, you should take regular prescription medications with a small sip of water as early as possible the morning of your procedure.                 OTHER INSTRUCTIONS  You will need a responsible adult at least 66 years of age to accompany you and drive you home.   This person must remain in the waiting room during your procedure.  Wear loose fitting clothing that is easily removed.  Leave jewelry and other valuables at home.  However, you may wish to bring a book to read or an iPod/MP3 player to listen to music as you wait for your procedure to start.  Remove all body piercing jewelry and leave at home.  Total time from sign-in until discharge is approximately 2-3 hours.  You should go home directly after your procedure and rest.  You can resume normal activities the day after your procedure.  The day of  your procedure you should not:   Drive   Make legal decisions   Operate machinery   Drink alcohol   Return to work  You will receive specific instructions about eating, activities and medications before you leave.   The above instructions have been reviewed and explained to me by   _______________________    I fully understand and can verbalize these instructions _____________________________ Date _______

## 2010-07-28 NOTE — Progress Notes (Signed)
Summary: crestor  Phone Note Refill Request Message from:  Fax from Pharmacy on December 04, 2009 9:30 AM  Refills Requested: Medication #1:  CRESTOR 10 MG TABS 1/2 tab q o day   Notes: [facility] - [place], [place]    Initial call taken by: Debbra Riding,  December 04, 2009 9:30 AM  Follow-up for Phone Call        See Rx. Follow-up by: Gladis Riffle, RN,  December 04, 2009 10:34 AM    Prescriptions: CRESTOR 10 MG TABS (ROSUVASTATIN CALCIUM) 1/2 tab q o day  #30 x 3   Entered by:   Gladis Riffle, RN   Authorized by:   Birdie Sons MD   Signed by:   Gladis Riffle, RN on 12/04/2009   Method used:   Electronically to        [facility] (retail)       [address]       [place], [place]  [id number]       Ph: [phone]       Fax: [phone]   RxID:   [id number]

## 2010-07-28 NOTE — Letter (Signed)
Summary: New Patient letter  [facility]  [address] [place], [place] [address]   Phone: [phone]  Fax: [phone]       01/01/2010 MRN: [id number]  [facility] [address] [place], [place]  [address]  Dear Ms. HUDGINS,  Welcome to the Gastroenterology Division at [facility]    You are scheduled to see Dr. Jarold Motto on 02/05/2010 at 9:00AM on the 3rd floor at [facility], [address]  We ask that you try to arrive at our office 15 minutes prior to your appointment time to allow for check-in.  We would like you to complete the enclosed self-administered evaluation form prior to your visit and bring it with you on the day of your appointment.  We will review it with you.  Also, please bring a complete list of all your medications or, if you prefer, bring the medication bottles and we will list them.  Please bring your insurance card so that we may make a copy of it.  If your insurance requires a referral to see a specialist, please bring your referral form from your primary care physician.  Co-payments are due at the time of your visit and may be paid by cash, check or credit card.     Your office visit will consist of a consult with your physician (includes a physical exam), any laboratory testing he/she may order, scheduling of any necessary diagnostic testing (e.g. x-ray, ultrasound, CT-scan), and scheduling of a procedure (e.g. Endoscopy, Colonoscopy) if required.  Please allow enough time on your schedule to allow for any/all of these possibilities.    If you cannot keep your appointment, please call [phone] to cancel or reschedule prior to your appointment date.  This allows [place] the opportunity to schedule an appointment for another patient in need of care.  If you do not cancel or reschedule by 5 p.m. the business day prior to your appointment date, you will be charged a $50.00 late cancellation/no-show fee.    Thank you for choosing  [facility] for your medical needs.  We appreciate the opportunity to care for you.  Please visit [place] at our website  to learn more about our practice.                     Sincerely,                                                             The Gastroenterology Division

## 2010-07-28 NOTE — Progress Notes (Signed)
Summary: Medication  Phone Note Call from Patient Call back at Home Phone [phone]   Caller: Patient Call For: Dr. Jarold Motto Reason for Call: Talk to Nurse Summary of Call: OSMOPREP is too expensive...needs an alternative med Initial call taken by: Karna Christmas,  February 05, 2010 1:43 PM  Follow-up for Phone Call        Osmo prep cost $120.  Pt would rather try the movi prep.  She asks if she can take laxatives a few days prior to doing the movi prep? Follow-up by: Ashok Cordia RN,  February 05, 2010 3:34 PM  Additional Follow-up for Phone Call Additional follow up Details #1::        let her do miralax prep Additional Follow-up by: Mardella Layman MD Clementeen Graham,  February 06, 2010 8:50 AM    Additional Follow-up for Phone Call Additional follow up Details #2::    Pt notified.  Rx sent for miralax prep.  Instructions mailed to pt.  Pt instructed to call if she has any questions re prep.  Pt req to change appt day for proc.  Her husband is sick and may need to be admitted to hospital.  APpt changed as requested.   Follow-up by: Ashok Cordia RN,  February 06, 2010 11:17 AM  New/Updated Medications: MIRALAX   POWD (POLYETHYLENE GLYCOL 3350) As per prep  instructions. METOCLOPRAMIDE HCL 10 MG  TABS (METOCLOPRAMIDE HCL) As per prep instructions. DULCOLAX 5 MG  TBEC (BISACODYL) Day before procedure take 2 at 3pm and 2 at 8pm. Prescriptions: DULCOLAX 5 MG  TBEC (BISACODYL) Day before procedure take 2 at 3pm and 2 at 8pm.  #4 x 0   Entered by:   Ashok Cordia RN   Authorized by:   Mardella Layman MD [facility]   Signed by:   Ashok Cordia RN on 02/06/2010   Method used:   Electronically to        [facility] (retail)       [address]       [place], [place]  [id number]       Ph: [phone]       Fax: [phone]   RxID:   [id number] METOCLOPRAMIDE HCL 10 MG  TABS (METOCLOPRAMIDE HCL) As per prep instructions.  #2 x 0   Entered by:   Ashok Cordia RN  Authorized by:   Mardella Layman MD [facility]   Signed by:   Ashok Cordia RN on 02/06/2010   Method used:   Electronically to        [facility] (retail)       [address]       [place], [place]  [id number]       Ph: [phone]       Fax: [phone]   RxID:   [id number] MIRALAX   POWD (POLYETHYLENE GLYCOL 3350) As per prep  instructions.  #255gm x 0   Entered by:   Ashok Cordia RN   Authorized by:   Mardella Layman MD [facility]   Signed by:   Ashok Cordia RN on 02/06/2010   Method used:   Electronically to        [facility] (retail)       [address]       [place], [place]  [id number]       Ph: [phone]       Fax: [phone]   RxID:   [id number]

## 2010-07-28 NOTE — Letter (Signed)
Summary: Osmoprep Instructions  [facility]  [address] [place], [place] [address]   Phone: [phone]  Fax: [phone]       Gina Weber    Jan 06, 1946    MRN: [id number]      Procedure Day Dorna Bloom: [name], 02/11/10     Arrival Time: 1;30 PM      Procedure Time: 2:30 PM    Location of Procedure:                    _X_   Endoscopy Center (4th Floor)   PREPARATION FOR COLONOSCOPY WITH OSMOPREP  Starting 5 days prior to your procedure 02/06/10 do not eat nuts, seeds, popcorn, corn, beans, peas,  salads, or any raw vegetables.  Do not take any fiber supplements (e.g. Metamucil, Citrucel, and Benefiber). _________________________________________________________________________________________________  THE DAY BEFORE YOUR PROCEDURE             TUESDAY, 02/10/10  1.   Drink clear liquids the entire day - NO SOLID FOOD.  Drink at least 64 oz. of fluid during the day to prevent hydration and help the prep work efficiently.    2.   Do not drink anything colored red or purple.  Avoid juices with pulp.  No orange juice.             CLEAR LIQUIDS INCLUDE: Water Jello Ice Popsicles Tea (sugar ok, no milk/cream) Powdered fruit flavored drinks Coffee (sugar ok, no milk/cream) Gatorade Juice: apple, white grape, white cranberry  Lemonade Clear bullion, consomm, broth Carbonated beverages (any kind) Strained chicken noodle soup Hard Candy  3.   Beginning at 5:00 p.m. or 6:00 p.m. the night before your procedure, drink one dose (4 tablets with 8 oz. of any clear liquid) every 15 minutes for a total of 5 doses (20 tablets total).   THE DAY OF YOUR PROCEDURE            WEDNESDAY, 02/11/10  1.   Beginning at 9:30 AM (5 hours before procedure), drink one dose (4 tablets with 8 oz. of any clear liquid) every 15 minutes for a total of 3 doses (12 tablets).  2.   You may drink clear liquids until 12:30 PM (2 hours before exam).  Do not drink anything after this  time.       MEDICATION INSTRUCTIONS  Unless otherwise instructed, you should take regular prescription medications with a small sip of water as early as possible the morning of your procedure.     OTHER INSTRUCTIONS  You will need a responsible adult at least 66 years of age to accompany you and drive you home.   This person must remain in the waiting room during your procedure.  Wear loose fitting clothing that is easily removed.  Leave jewelry and other valuables at home.  However, you may wish to bring a book to read or an iPod/MP3 player to listen to music as you wait for your procedure to start.  Remove all body piercing jewelry and leave at home.  Total time from sign-in until discharge is approximately 2-3 hours.  You should go home directly after your procedure and rest.  You can resume normal activities the day after your procedure.  The day of your procedure you should not:   Drive   Make legal decisions   Operate machinery   Drink alcohol   Return to work  You will receive specific instructions about eating, activities and medications before you leave.  The above instructions have been reviewed and explained to me by   _______________________   I fully understand and can verbalize these instructions _________________________ Date _________

## 2010-07-28 NOTE — Assessment & Plan Note (Signed)
Summary: DIVERTICULOSIS,CONSTIPATION...AS.   History of Present Illness Visit Type: Initial Consult Primary GI MD: Sheryn Bison MD FACP FAGA Primary Provider: Birdie Sons, MD Chief Complaint: Patient c/o 1-2 months worsening LLQ abdominal pain. Patient has hx of diverticulosis. She c/o constipation as well. No alternating diarrhea. No nausea, vomiting or fever. History of Present Illness:   Complicated 65 year old Caucasian female referred by Dr. Cato Mulligan for evaluation of several months of worsening left lower quadrant crampy discomfort with associated worsening constipation.  Gina Weber is a middle-aged Caucasian female who has chronic emphysema with limited exercise tolerance. 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 She is or variety of medications for emphysema and her hypertensive cardiovascular disease. She additionally suffers from fibromyalgia, osteoarthritis, hypertension, depression, and chronic anxiety. She has had bilateral mastectomies with radiation therapy for breast carcinoma, appendectomy, and hysterectomy. Her family history is remarkable for esophageal cancer in her mother.  The patient has had years of acid reflux and is currently on Nexium 40 mg a day. She denies dysphagia or acid reflux symptoms at this time and apparently has had endoscopy many years ago along with esophageal manometry and 24-hour pH probe testing. She also has chronic functional constipation and has had colon[facility] the [facility] [place] in  which is otherwise been unremarkable except for diverticulosis. The reports are not available for review.  She currently has difficulty evacuating despite trials of fiber supplements. She denies rectal bleeding, fever, chills, nausea vomiting, or hepatobiliary complaints. She denies abuse of alcohol, cigarettes, oriented.   GI Review  of Systems    Reports abdominal pain, acid reflux, bloating, chest pain, and  heartburn.     Location of  Abdominal pain: LLQ.    Denies belching, dysphagia with liquids, dysphagia with solids, loss of appetite, nausea, vomiting, vomiting blood, weight loss, and  weight gain.      Reports change in bowel habits, constipation, diverticulosis, hemorrhoids, irritable bowel syndrome, and  liver problems.     Denies anal fissure, black tarry stools, diarrhea, fecal incontinence, heme positive stool, jaundice, light color stool, rectal bleeding, and  rectal pain. Preventive Screening-Counseling & Management      Drug Use:  no.      Current Medications (verified): 1)  Amlodipine Besylate 10 Mg  Tabs (Amlodipine Besylate) .... Take 1 Tablet By Mouth Once A Day 2)  Paroxetine Hcl 40 Mg  Tabs (Paroxetine Hcl) .... One By Mouth Daily 3)  Hydrochlorothiazide 12.5 Mg  Tabs (Hydrochlorothiazide) .... Take 1 Tablet By Mouth Once A Day 4)  Zolpidem Tartrate 5 Mg  Tabs (Zolpidem Tartrate) .... Take 1 Tablet By Mouth At Bedtime 5)  Amitriptyline Hcl 10 Mg  Tabs (Amitriptyline Hcl) .... Take Two At Bedtime 6)  Singulair 10 Mg  Tabs (Montelukast Sodium) .... Take 1 Tablet By Mouth Once A Day 7)  Fexofenadine Hcl 180 Mg  Tabs (Fexofenadine Hcl) .... Take 1 Tablet By Mouth Once A Day 8)  Alprazolam 0.5 Mg Tabs (Alprazolam) .... Take 1 Tablet By Mouth Two Times A Day As Needed Anxiety 9)  Nexium 40 Mg  Cpdr (Esomeprazole Magnesium) .... Take 1 Capsule By Mouth Once A Day 10)  Cozaar 100 Mg  Tabs (Losartan Potassium) .... Take 1 Tablet By Mouth Once A Day 11)  Multivitamins   Tabs (Multiple Vitamin) .... Once Daily 12)  Niacin Cr 500 Mg  Tbcr (Niacin) .... Take 2 Once Daily 13)  Fish Oil 1000 Mg  Caps (Omega-3 Fatty Acids) .... Take 3 Once Daily 14)  Hydrocodone-Acetaminophen 5-325 Mg Tabs (Hydrocodone-Acetaminophen) .... Take 1 Tablet By Mouth Once A Day As Needed Back Pain 15)  Advair Diskus 100-50 Mcg/dose  Aepb (Fluticasone-Salmeterol) .Marland Kitchen.. 1 Puff Two Times A Day 16)  Klor-Con M20 20 Meq Cr-Tabs (Potassium Chloride Crys Cr) .... Take 1 Tablet By Mouth Once A Day 17)  B Complex Vitamins  Soln (B Complex Vitamins) .... One Dropperful Once Daily 18)  Vitamin D3 1000 Unit Caps (Cholecalciferol) .... Take 3 Capsules By Mouth Once Daily  Allergies (verified): 1)  ! Sulfamethoxazole (Sulfamethoxazole) 2)  ! Pravachol 3)  ! * Statins  Past History:  Past medical, surgical, family and social histories (including risk factors) reviewed for relevance to current acute and chronic problems.  Past Medical History: fibromyalgia osteoarthritis Allergic rhinitis Hyperlipidemia---statin intolerance (transaminitis) Hypertension GERD Breast cancer, hx of Depression COPD Anxiety arthritis diverticulosis  Past Surgical History: Hysterectomy Mastectomy-bilateral 1987---f/u reconstruction (x3---implant removal) recurrent breast ca 2006----radiation therapy.  Cataract extraction-left eye Appendectomy  Family History: Reviewed history from 11/27/2007 and no changes required. mother deceased esophageal CA 59 yo father deceased AAA 17 yo Family History of Heart Disease: Father No FH of Colon Cancer:  Social History: Reviewed history from 12/02/2009 and no changes required. Retired---manufacturing plant Never Smoked---long hx of 2nd hand smoke Alcohol use-no Regular exercise-no Married 2 kids---healthy except son with bipolar Daily Caffeine Use-4 cups daily Illicit Drug Use - no Drug Use:  no  Review of Systems       The patient complains of arthritis/joint pain, back pain, depression-new, fatigue, hearing problems, night sweats, shortness of breath, and urine leakage.  The patient denies allergy/sinus, anemia, anxiety-new, blood in urine, breast changes/lumps, change in vision, confusion, cough, coughing up blood, fainting, fever, headaches-new, heart murmur, heart rhythm changes, itching,  menstrual pain, muscle pains/cramps, nosebleeds, pregnancy symptoms, skin rash, sleeping problems, sore throat, swelling of feet/legs, swollen lymph glands, thirst - excessive , urination - excessive , urination changes/pain, vision changes, and voice change.   General:  Complains of fatigue; denies fever, chills, sweats, anorexia, weakness, malaise, weight loss, and sleep disorder. Eyes:  Denies blurring, diplopia, irritation, discharge, vision loss, scotoma, eye pain, and photophobia. ENT:  Complains of decreased hearing. CV:  Denies chest pains, angina, palpitations, syncope, dyspnea on exertion, orthopnea, PND, peripheral edema, and claudication. Resp:  Complains of dyspnea with exercise; denies dyspnea at rest, cough, sputum, wheezing, coughing up blood, and pleurisy. GI:  Complains of abdominal pain and constipation; denies difficulty swallowing, pain on swallowing, nausea, indigestion/heartburn, vomiting, vomiting blood, jaundice, gas/bloating, diarrhea, change in bowel habits, bloody BM's, black BMs, and fecal incontinence. GU:  Complains of urinary incontinence; denies urinary burning, blood in urine, nocturnal urination, urinary frequency, abnormal vaginal bleeding, amenorrhea, menorrhagia, vaginal discharge, pelvic pain, genital sores, painful intercourse, and decreased libido. MS:  Complains of joint pain / LOM, joint swelling, joint stiffness, joint deformity, and low back pain; denies muscle weakness, muscle cramps, muscle atrophy, leg pain at night, leg pain with exertion, and shoulder pain / LOM hand / wrist pain (CTS). Derm:  Denies rash, itching, dry skin, hives, moles, warts, and unhealing ulcers. Neuro:  Denies weakness, paralysis, abnormal sensation, seizures, syncope, tremors, vertigo, transient blindness, frequent falls, frequent headaches, difficulty walking, headache, sciatica, radiculopathy other:, restless legs, memory loss, and confusion. Psych:  Complains of depression and  anxiety; denies memory loss,  suicidal ideation, hallucinations, paranoia, phobia, and confusion. Endo:  Denies cold intolerance, heat intolerance, polydipsia, polyphagia, polyuria, unusual weight change, and hirsutism. Heme:  Complains of bruising; denies bleeding, enlarged lymph nodes, and pagophagia. Allergy:  Denies hives, rash, sneezing, hay fever, and recurrent infections.  Vital Signs:  Patient profile:   65 year old female Height:      65 inches Weight:      167.50 pounds BMI:     27.97 BSA:     1.84 Pulse rate:   80 / minute Pulse rhythm:   regular BP sitting:   104 / 68  (left arm)  Vitals Entered By: Lamona Curl CMA Duncan Dull) (February 05, 2010 8:55 AM)  Physical Exam  General:  Well developed, well nourished, no acute distress. Head:  Normocephalic and atraumatic. Eyes:  PERRLA, no icterus.exam deferred to patient's ophthalmologist.   Mouth:  No deformity or lesions, dentition normal. Neck:  Supple; no masses or thyromegaly. Lungs:  Clear throughout to auscultation.decreased BS on L and decreased BS on R.   Heart:  Regular rate and rhythm; no murmurs, rubs,  or bruits. Abdomen:  Soft, nontender and nondistended. No masses, hepatosplenomegaly or hernias noted. Normal bowel sounds. Rectal:  deferred until time of colonoscopy.   Msk:  Symmetrical with no gross deformities. Normal posture.decreased ROM, rhematoid changes, and arthritic changes.   Extremities:  No clubbing, cyanosis, edema or deformities noted.trace pedal edema.   Neurologic:  Alert and  oriented x4;  grossly normal neurologically. Cervical Nodes:  No significant cervical adenopathy. Psych:  Alert and cooperative. Normal mood and affect.   Impression & Recommendations:  Problem # 1:  ABDOMINAL PAIN, LEFT LOWER QUADRANT (ICD-789.04) Assessment Unchanged Chronic BS and diverticulosis coli---rule out segmental colitis. I prescribed fiber diet, daily Benefiber, liberal p.o. fluids, and p.r.n. tramadol for  pain instead of heavy doses of acetaminophen. Colonoscopy has been scheduled her convenience. Orders: Colon/Endo (Colon/Endo)  Problem # 2:  GERD (ICD-530.81) Assessment: Improved continue reflex regime and daily Nexium with endoscopic followup at the time of colonoscopy. Orders: Colon/Endo (Colon/Endo)  Problem # 3:  DEPRESSION (ICD-311) Assessment: Improved she is on amitriptyline 20 mg at bedtime and p.r.n. alprazolam. She denies psychotic depression or worsening of her of her symptoms.  Problem # 4:  COPD (ICD-496) Assessment: Unchanged continue multiple pulmonary and cardiac medications per primary care.  Problem # 5:  BREAST CANCER, HX OF (ICD-V10.3) Assessment: Unchanged  Problem # 6:  HYPERTENSION (ICD-401.9) Assessment: Improved blood pressure today 104/68, and I've advised her to continue her HCTZ,amlopidine, Cozaar, and potassium replacement.  Patient Instructions: 1)  Please pick up your medications at your pharmacy. TRAMADOL, OSMOPREP 2)  We will see you at your procedure on 02/11/10. 3)  [facility] Patient Information Guide given to patient.  4)  High Fiber, Low Fat  Healthy Eating Plan brochure given.  5)  Colonoscopy and Flexible Sigmoidoscopy brochure given.  6)  Diverticular Disease brochure given.  7)  Upper Endoscopy brochure given.  8)  The medication list was reviewed and reconciled.  All changed / newly prescribed medications were explained.  A complete medication list was provided to the patient / caregiver. 9)  Copy sent to : Dr. Birdie Sons 10)  Avoid foods high in acid content ( tomatoes, citrus juices, spicy foods) . Avoid eating within 3 to 4 hours of lying down or before exercising. Do not over eat; try smaller more frequent meals. Elevate head of bed four inches when sleeping.  11)  Diverticular Disease brochure given.  Prescriptions: OSMOPREP 1.102-0.398 GM  TABS (SOD PHOS MONO-SOD PHOS DIBASIC) As per prep instructions.  #32 x 0    Entered by:   Francee Piccolo CMA (AAMA)   Authorized by:   Mardella Layman MD [facility]   Signed by:   Francee Piccolo CMA (AAMA) on 02/05/2010   Method used:   Electronically to        [facility] (retail)       [address]       [place], [place]  [id number]       Ph: [phone]       Fax: [phone]   RxID:   [id number] TRAMADOL HCL 50 MG TABS (TRAMADOL HCL) take 1 tablet by mouth every 6-8 hours as needed for pain  #65 x 2   Entered by:   Francee Piccolo CMA (AAMA)   Authorized by:   Mardella Layman MD [facility]   Signed by:   Francee Piccolo CMA (AAMA) on 02/05/2010   Method used:   Electronically to        [facility] (retail)       [address]       [place], [place]  [id number]       Ph: [phone]       Fax: [phone]   RxID:   [id number]

## 2010-07-28 NOTE — Progress Notes (Signed)
Summary: refill   Phone Note Refill Request Message from:  Fax from Pharmacy on December 22, 2009 2:05 PM  Refills Requested: Medication #1:  AMITRIPTYLINE HCL 10 MG  TABS take two at bedtime Initial call taken by: Kern Reap CMA Duncan Dull),  December 22, 2009 2:05 PM    Prescriptions: AMITRIPTYLINE HCL 10 MG  TABS (AMITRIPTYLINE HCL) take two at bedtime  #180 x 3   Entered by:   Kern Reap CMA (AAMA)   Authorized by:   Birdie Sons MD   Signed by:   Kern Reap CMA (AAMA) on 12/22/2009   Method used:   Electronically to        [facility]* (mail-order)       [address] [place] [address], [address] [place] [address], [place]  [address]       Ph: [phone]       Fax: [phone]   RxID:   [id number]

## 2010-07-28 NOTE — Progress Notes (Signed)
Summary: FYI  Phone Note Call from Patient Call back at Home Phone [phone]   Caller: vm Action Taken: Appt Scheduled Today Summary of Call: Remembered that she has cardiology Dr. Jens Som.  He actually referred me to Dr. Cato Mulligan.   I had cath in 2008.  Please put info in file.  No call back necessary.  Let Alvino Chapel & Dr. Cato Mulligan know. Initial call taken by: Rudy Jew, RN,  December 02, 2009 2:29 PM  Follow-up for Phone Call        cardiac cath in 2008   in e-chart. Cardio ov note in EMR already. Follow-up by: Gladis Riffle, RN,  December 03, 2009 11:16 AM

## 2010-07-28 NOTE — Procedures (Signed)
Summary: Upper Endoscopy  Patient: Gina Weber Note: All result statuses are Final unless otherwise noted.  Tests: (1) Upper Endoscopy (EGD)   EGD Upper Endoscopy       DONE     [facility]     [address]     [place], [place]  [address]           ENDOSCOPY PROCEDURE REPORT           PATIENT:  Gina Weber, Gina Weber  MR#:  [id number]     BIRTHDATE:  Apr 19, 1946, 64 yrs. old  GENDER:  female           ENDOSCOPIST:  Vania Rea. Jarold Motto, MD, [facility]     Referred by:  Birdie Sons, M.D.           PROCEDURE DATE:  02/27/2010     PROCEDURE:  EGD, diagnostic     ASA CLASS:  Class III     INDICATIONS:  GERD FH OF ESOPHAGEAL CA IN HER MOTHER.           MEDICATIONS:   There was residual sedation effect present from     prior procedure., Versed 3 mg IV     TOPICAL ANESTHETIC:  Exactacain Spray           DESCRIPTION OF PROCEDURE:   After the risks benefits and     alternatives of the procedure were thoroughly explained, informed     consent was obtained.  The LB GIF-H180 [id number] endoscope was     introduced through the mouth and advanced to the second portion of     the duodenum, without limitations.  The instrument was slowly     withdrawn as the mucosa was fully examined.     <<PROCEDUREIMAGES>>           The upper, middle, and distal third of the esophagus were     carefully inspected and no abnormalities were noted. The z-line     was well seen at the GEJ. The endoscope was pushed into the fundus     which was normal including a retroflexed view. The antrum,gastric     body, first and second part of the duodenum were unremarkable. CLO     BX. DONE.    Retroflexed views revealed no abnormalities.    The     scope was then withdrawn from the patient and the procedure     completed.           COMPLICATIONS:  None           ENDOSCOPIC IMPRESSION:     1) Normal EGD     CHRONIC NONEROSIVE GERD     RECOMMENDATIONS:     1) Rx CLO if positive     2) continue PPI           REPEAT  EXAM:  No           ______________________________     Vania Rea. Jarold Motto, MD, [facility]           CC:           n.     eSIGNED:   Vania Rea. Patterson at 02/27/2010 04:09 PM           [place], [place] [id number]  Note: An exclamation mark (!) indicates a result that was not dispersed into the flowsheet. Document Creation Date: 02/27/2010 4:09 PM _______________________________________________________________________  (1) Order result status: Final Collection or observation date-time: 02/27/2010 16:03 Requested date-time:  Receipt date-time:  Reported date-time:  Referring Physician:   Ordering Physician: Sheryn Bison [phone]) Specimen Source:  Source: Launa Grill Order Number: [id number] Lab site:

## 2010-07-28 NOTE — Procedures (Signed)
Summary: Colonoscopy  Patient: Gina Weber Note: All result statuses are Final unless otherwise noted.  Tests: (1) Colonoscopy (COL)   COL Colonoscopy           DONE     [facility]     [address]     [place], [place]  [address]           COLONOSCOPY PROCEDURE REPORT           PATIENT:  Gina Weber, Gina Weber  MR#:  [id number]     BIRTHDATE:  28-Jul-1945, 64 yrs. old  GENDER:  female     ENDOSCOPIST:  Vania Rea. Jarold Motto, MD, [facility]     REF. BY:  Birdie Sons, M.D.     PROCEDURE DATE:  02/27/2010     PROCEDURE:  Average-risk screening colonoscopy     G0121     ASA CLASS:  Class III     INDICATIONS:  Routine Risk Screening, constipation, Abdominal pain           MEDICATIONS:   Fentanyl 100 mcg IV, Versed 10 mg IV, Benadryl 50     mg IV           DESCRIPTION OF PROCEDURE:   After the risks benefits and     alternatives of the procedure were thoroughly explained, informed     consent was obtained.  Digital rectal exam was performed and     revealed no abnormalities.   The [id number] [id number] endoscope was     introduced through the anus and advanced to the cecum, which was     identified by both the appendix and ileocecal valve, limited by     extreme patient discomfort, poor preparation.    The quality of     the prep was poor, using MoviPrep.  The instrument was then slowly     withdrawn as the colon was fully examined.     <<PROCEDUREIMAGES>>           FINDINGS:  A sessile polyp was found in the sigmoid colon. 5 MM     FLAT POLYP COLD SNARE EXCISED FROM RECTOSIGMOID AREA.  This was     otherwise a normal examination of the colon.   Retroflexion was     not performed.  The scope was then withdrawn from the patient and     the procedure completed.           COMPLICATIONS:  None     ENDOSCOPIC IMPRESSION:     1) Sessile polyp in the sigmoid colon     2) Otherwise normal examination     CONSTIPATION PREDOMINANT IBS.     RECOMMENDATIONS:     1) Follow up colonoscopy in 5 years   2) Await biopsy results     3) Titrate to need     REPEAT EXAM:  No           ______________________________     Vania Rea. Jarold Motto, MD, [facility]           CC:           n.     eSIGNED:   Vania Rea. Patterson at 02/27/2010 03:58 PM           [place], [place], [id number]  Note: An exclamation mark (!) indicates a result that was not dispersed into the flowsheet. Document Creation Date: 02/27/2010 3:58 PM _______________________________________________________________________  (1) Order result status: Final Collection or observation date-time: 02/27/2010 15:52 Requested date-time:  Receipt date-time:  Reported date-time:  Referring Physician:   Ordering Physician: Sheryn Bison [phone]) Specimen Source:  Source: Launa Grill Order Number: [id number] Lab site:   Appended Document: Colonoscopy 5Y F/U

## 2010-07-28 NOTE — Assessment & Plan Note (Signed)
Summary: fup/cjr   Vital Signs:  Patient profile:   65 year old female Weight:      167 pounds Temp:     98.4 degrees F Pulse rate:   72 / minute Pulse rhythm:   regular Resp:     16 per minute BP sitting:   124 / 66  Primary Care Provider:  Birdie Sons, MD   History of Present Illness:  Follow-Up Visit      This is a 65 year old woman who presents for Follow-up visit.  The patient denies chest pain and palpitations.  Since the last visit the patient notes no new problems or concerns.  The patient reports taking meds as prescribed.  When questioned about possible medication side effects, the patient notes none.  she tells me that she has started taking crestor 3 times weekly, using niacin 3-4 times per week and fish oil daily. not exercising  All other systems reviewed and were negative   Allergies: 1)  ! Sulfamethoxazole (Sulfamethoxazole) 2)  ! Pravachol 3)  ! * Statins  Past History:  Past Medical History: Last updated: 03-04-10 fibromyalgia osteoarthritis Allergic rhinitis Hyperlipidemia---statin intolerance (transaminitis) Hypertension GERD Breast cancer, hx of Depression COPD Anxiety arthritis diverticulosis  Past Surgical History: Last updated: March 04, 2010 Hysterectomy Mastectomy-bilateral 1987---f/u reconstruction (x3---implant removal) recurrent breast ca 2006----radiation therapy.  Cataract extraction-left eye Appendectomy  Family History: Last updated: 04-Mar-2010 mother deceased esophageal CA 54 yo father deceased AAA 65 yo Family History of Heart Disease: Father No FH of Colon Cancer:  Social History: Last updated: 03/04/10 Retired---manufacturing plant Never Smoked---long hx of 2nd hand smoke Alcohol use-no Regular exercise-no Married 2 kids---healthy except son with bipolar Daily Caffeine Use-4 cups daily Illicit Drug Use - no  Risk Factors: Exercise: yes (11/27/2007)  Risk Factors: Smoking Status: never  (12/30/2009)  Physical Exam  General:  well-developed well-nourished female in no acute distress. HEENT exam atraumatic, normocephalic neck is supple. Chest clear auscultation. Cardiac exam S1-S2 are regular. Extremities no clubbing cyanosis or edema.   Impression & Recommendations:  Problem # 1:  HYPERLIPIDEMIA (ICD-272.4) patient has trouble to statin therapy in the past. I still think she will do best on statin therapy. Will try extremely low dose of Crestor. Side effects discussed. I'll followup her labs in 3 months. Her updated medication list for this problem includes:    Niacin Cr 500 Mg Tbcr (Niacin) .Marland Kitchen... Take 2 once daily    Crestor 10 Mg Tabs (Rosuvastatin calcium) .Marland Kitchen... 1/2 3 times weekly  Orders: Venipuncture (87564) Specimen Handling (33295) TLB-Lipid Panel (80061-LIPID)  Labs Reviewed: SGOT: 23 (04/03/2010)   SGPT: 23 (04/03/2010)   HDL:42.50 (04/03/2010), 49.20 (08/08/2009)  LDL:DEL (08/19/2008), DEL (01/30/2008)  Chol:275 (04/03/2010), 217 (08/08/2009)  Trig:499.0 (04/03/2010), 279.0 (08/08/2009)  Complete Medication List: 1)  Amlodipine Besylate 10 Mg Tabs (Amlodipine besylate) .... Take 1 tablet by mouth once a day 2)  Paroxetine Hcl 40 Mg Tabs (Paroxetine hcl) .... One by mouth daily 3)  Hydrochlorothiazide 12.5 Mg Tabs (Hydrochlorothiazide) .... Take 1 tablet by mouth once a day 4)  Amitriptyline Hcl 10 Mg Tabs (Amitriptyline hcl) .... Take two at bedtime 5)  Singulair 10 Mg Tabs (Montelukast sodium) .... Take 1 tablet by mouth once a day 6)  Alprazolam 0.5 Mg Tabs (Alprazolam) .... Take 1 tablet by mouth two times a day as needed anxiety 7)  Nexium 40 Mg Cpdr (Esomeprazole magnesium) .... Take 1 capsule by mouth once a day 8)  Cozaar 100 Mg Tabs (Losartan potassium) .Marland KitchenMarland KitchenMarland Kitchen  Take 1 tablet by mouth once a day 9)  Multivitamins Tabs (Multiple vitamin) .... Once daily 10)  Niacin Cr 500 Mg Tbcr (Niacin) .... Take 2 once daily 11)  Fish Oil 1000 Mg Caps (Omega-3 fatty  acids) .... Take 3 once daily 12)  Advair Diskus 100-50 Mcg/dose Aepb (Fluticasone-salmeterol) .Marland Kitchen.. 1 puff two times a day 13)  B Complex Vitamins Soln (B complex vitamins) .... One dropperful once daily 14)  Vitamin D3 1000 Unit Caps (Cholecalciferol) .... Take 3 capsules by mouth once daily 15)  Crestor 10 Mg Tabs (Rosuvastatin calcium) .... 1/2 3 times weekly  Other Orders: UA Dipstick w/o Micro (automated)  (81003) TLB-Hepatic/Liver Function Pnl (80076-HEPATIC)  Patient Instructions: 1)  . Prescriptions: CRESTOR 10 MG TABS (ROSUVASTATIN CALCIUM) 1/2 3 times weekly  #90 x 3   Entered and Authorized by:   Birdie Sons MD   Signed by:   Birdie Sons MD on 05/13/2010   Method used:   Electronically to        [facility]* (mail-order)       [address], [address]. [place], [place]  [address]       Ph: [phone]       Fax: [phone]   RxID:   [id number]    Orders Added: 1)  Venipuncture [87564] 2)  Est. Patient Level III [33295] 3)  UA Dipstick w/o Micro (automated)  [81003] 4)  Specimen Handling [99000] 5)  TLB-Hepatic/Liver Function Pnl [80076-HEPATIC] 6)  TLB-Lipid Panel [80061-LIPID]    Laboratory Results   Urine Tests  Date/Time Received: May 13, 2010   Routine Urinalysis   Color: yellow Appearance: Cloudy Glucose: negative   (Normal Range: Negative) Bilirubin: negative   (Normal Range: Negative) Ketone: negative   (Normal Range: Negative) Spec. Gravity: 1.020   (Normal Range: 1.003-1.035) Blood: trace-lysed   (Normal Range: Negative) pH: 7.0   (Normal Range: 5.0-8.0) Protein: negative   (Normal Range: Negative) Urobilinogen: 0.2   (Normal Range: 0-1) Nitrite: negative   (Normal Range: Negative) Leukocyte Esterace: moderate   (Normal Range: Negative)    Comments: Kern Reap CMA Duncan Dull)  May 13, 2010 12:53 PM

## 2010-08-07 ENCOUNTER — Other Ambulatory Visit: Payer: Self-pay | Admitting: *Deleted

## 2010-09-11 LAB — URINALYSIS, ROUTINE W REFLEX MICROSCOPIC
Glucose, UA: NEGATIVE mg/dL
Nitrite: NEGATIVE
Protein, ur: NEGATIVE mg/dL
pH: 7 (ref 5.0–8.0)

## 2010-09-11 LAB — URINE CULTURE
Colony Count: 25000
Culture  Setup Time: 201108131955

## 2010-09-11 LAB — URINE MICROSCOPIC-ADD ON

## 2010-10-01 ENCOUNTER — Other Ambulatory Visit: Payer: Self-pay | Admitting: *Deleted

## 2010-10-01 DIAGNOSIS — G4700 Insomnia, unspecified: Secondary | ICD-10-CM

## 2010-10-01 MED ORDER — ZOLPIDEM TARTRATE 5 MG PO TABS: 5.0000 mg | ORAL_TABLET | Freq: Every evening | ORAL | Status: DC | PRN

## 2010-10-15 ENCOUNTER — Emergency Department (HOSPITAL_COMMUNITY)
Admission: EM | Admit: 2010-10-15 | Discharge: 2010-10-15 | Disposition: A | Discharge status: Home / Self Care | Payer: Medicare (Managed Care) | Attending: Emergency Medicine | Admitting: Emergency Medicine

## 2010-10-15 ENCOUNTER — Emergency Department (HOSPITAL_COMMUNITY): Payer: Medicare (Managed Care)

## 2010-10-15 DIAGNOSIS — Z79899 Other long term (current) drug therapy: Secondary | ICD-10-CM | POA: Insufficient documentation

## 2010-10-15 DIAGNOSIS — J45909 Unspecified asthma, uncomplicated: Secondary | ICD-10-CM | POA: Insufficient documentation

## 2010-10-15 DIAGNOSIS — R5383 Other fatigue: Secondary | ICD-10-CM | POA: Insufficient documentation

## 2010-10-15 DIAGNOSIS — R079 Chest pain, unspecified: Secondary | ICD-10-CM | POA: Insufficient documentation

## 2010-10-15 DIAGNOSIS — Z853 Personal history of malignant neoplasm of breast: Secondary | ICD-10-CM | POA: Insufficient documentation

## 2010-10-15 DIAGNOSIS — R0989 Other specified symptoms and signs involving the circulatory and respiratory systems: Secondary | ICD-10-CM | POA: Insufficient documentation

## 2010-10-15 DIAGNOSIS — I10 Essential (primary) hypertension: Secondary | ICD-10-CM | POA: Insufficient documentation

## 2010-10-15 DIAGNOSIS — R0609 Other forms of dyspnea: Secondary | ICD-10-CM | POA: Insufficient documentation

## 2010-10-15 DIAGNOSIS — E7800 Pure hypercholesterolemia, unspecified: Secondary | ICD-10-CM | POA: Insufficient documentation

## 2010-10-15 DIAGNOSIS — R5381 Other malaise: Secondary | ICD-10-CM | POA: Insufficient documentation

## 2010-10-15 LAB — CBC
HCT: 38.8 % (ref 36.0–46.0)
Hemoglobin: 13.5 g/dL (ref 12.0–15.0)
MCH: 30.8 pg (ref 26.0–34.0)
MCV: 88.6 fL (ref 78.0–100.0)
Platelets: 182 10*3/uL (ref 150–400)
RBC: 4.38 MIL/uL (ref 3.87–5.11)
WBC: 6 10*3/uL (ref 4.0–10.5)

## 2010-10-15 LAB — URINALYSIS, ROUTINE W REFLEX MICROSCOPIC
Bilirubin Urine: NEGATIVE
Glucose, UA: NEGATIVE mg/dL
Hgb urine dipstick: NEGATIVE
Ketones, ur: NEGATIVE mg/dL
Specific Gravity, Urine: <1.005 — ABNORMAL LOW (ref 1.005–1.030)
pH: 6.5 (ref 5.0–8.0)

## 2010-10-15 LAB — DIFFERENTIAL
Eosinophils Absolute: 0.1 10*3/uL (ref 0.0–0.7)
Lymphocytes Relative: 19 % (ref 12–46)
Lymphs Abs: 1.1 10*3/uL (ref 0.7–4.0)
Monocytes Relative: 11 % (ref 3–12)
Neutrophils Relative %: 69 % (ref 43–77)

## 2010-10-15 LAB — BASIC METABOLIC PANEL
BUN: 8 mg/dL (ref 6–23)
CO2: 28 mEq/L (ref 19–32)
Chloride: 102 mEq/L (ref 96–112)
Creatinine, Ser: 0.75 mg/dL (ref 0.4–1.2)
Glucose, Bld: 94 mg/dL (ref 70–99)

## 2010-10-15 LAB — POCT CARDIAC MARKERS

## 2010-11-04 ENCOUNTER — Other Ambulatory Visit: Payer: Self-pay | Admitting: *Deleted

## 2010-11-04 MED ORDER — AMITRIPTYLINE HCL 10 MG PO TABS: 20.0000 mg | ORAL_TABLET | Freq: Every day | ORAL | Status: DC

## 2010-11-06 ENCOUNTER — Ambulatory Visit: Payer: Medicare (Managed Care) | Admitting: Urology

## 2010-11-10 ENCOUNTER — Ambulatory Visit (INDEPENDENT_AMBULATORY_CARE_PROVIDER_SITE_OTHER): Payer: Medicare (Managed Care) | Admitting: Urology

## 2010-11-10 DIAGNOSIS — R3915 Urgency of urination: Secondary | ICD-10-CM

## 2010-11-10 DIAGNOSIS — R350 Frequency of micturition: Secondary | ICD-10-CM

## 2010-11-10 DIAGNOSIS — R300 Dysuria: Secondary | ICD-10-CM

## 2010-11-10 NOTE — Assessment & Plan Note (Signed)
[facility]                            CARDIOLOGY OFFICE NOTE   NAME:Gina Weber, Gina Weber                        MRN:          [id number]  DATE:07/17/2007                            DOB:          Nov 23, 1945    [name] is a very pleasant 65 year old female who was recently  admitted [facility] on June 10, 2007.  She has a history of  hypertension, hyperlipidemia, and fibromyalgia.  When she presented she  was complaining of chest pain.  Her enzymes were negative.  She  underwent cardiac catheterization by Dr. Gala Weber on June 12, 2007.  There was no coronary disease noted and she had ejection fraction of 65-  70%.  Note, during that admission her cholesterol was noted to be  elevated and she was started on Zocor 40 mg p.o. daily.  Since discharge  she has done well.  She denies any significant dyspnea.  There is no  orthopnea or PND.  She has not had further chest pain.  She did have  laboratories drawn by nurse practitioner and her total cholesterol  returned at 243 with triglycerides of 285.  Her LDL is 138.  However,  her SGPT was elevated 56 and alkaline phosphate is 132 with a GGT of  142.  Her potassium had improved to 3.9 since admission.  Her  medications at present include Paxil 30 mg daily, Nexium 40 mg daily,  Singulair, fexofenadine, Ambien, Cozaar 100 mg daily, amlodipine 5 mg  daily, hydrochlorothiazide 12.5 mg daily, and Zocor 40 mg p.o. nightly.   PHYSICAL EXAM:  Shows a blood pressure 153/92 and pulse is 92.  She  weighs 170 pounds.  HEENT:  Normal.  Her neck is supple with no bruits.  CHEST:  Clear.  CARDIOVASCULAR:  Regular rate and rhythm.  Her abdominal exam shows no tenderness.  Right groin shows no hematoma  but there is a soft bruit noted.  EXTREMITIES:  Show no edema.   Electrocardiogram shows a sinus rhythm at a rate of 84.  There is right  axis deviation as well as left atrial enlargement.    DIAGNOSIS:  1. Recent chest pain - her cardiac catheterization showed no coronary      disease and her symptoms have resolved.  Will not pursue further      cardiac workup at this point.  2. Hypertension - her blood pressure is elevated today and I will      increase her amlodipine 810 mg p.o. daily.  We will continue with      her Cozaar and her HCTZ.  3. Elevated liver functions - she now states that she has had elevated      liver function of 12 all statins in the past.  Will discontinue her      Zocor and I will refer her lipid clinic for further management.  4. Right groin bruit - we will check all sent to exclude      pseudoaneurysm given her recent catheterization.  5. Myalgia - per primary care physician.   She is asked to have a  physician here in [place] I would follow for  primary care needs.  We will arrange that and I will see back on an as-  needed basis.   NOTE:  She will need follow-up liver functions in 4 weeks.     Gina Weber Gina Som, MD, [facility]  Electronically Signed    BSC/MedQ  DD: 07/17/2007  DT: 07/17/2007  Job #: [id number]

## 2010-11-10 NOTE — Assessment & Plan Note (Signed)
[facility]                               LIPID CLINIC NOTE   NAME:HUDGINS, NYOMI HOWSER                        MRN:          [id number]  DATE:09/14/2007                            DOB:          February 11, 1946    Return office visit for lipid clinic.   PAST MEDICAL HISTORY:  1. Hyperlipidemia.  2. Hypertension.  3. Fibromyalgia.   MEDICATIONS:  1. Paxil 30 mg daily.  2. Nexium 40 mg daily.  3. Singulair 10 mg daily.  4. Fexofenadine 180 mg daily.  5. Ambien 5 mg at bedtime.  6. Cozaar 100 mg daily.  7. Norvasc 5 mg daily.  8. HCTZ 12.5 mg daily.  9. Crestor 5 mg every other day.   PHYSICAL EXAMINATION:  VITAL SIGNS:  Weight 171 pounds, blood pressure  144/80, heart rate 96.   LABORATORY DATA:  AST 22, ALT 26, DDT at 58.   ASSESSMENT:  [name] is a very pleasant woman who returns to lipid  clinic, today, with no chest pain, no shortness of breath, no muscle  aches or pain.  She is compliant with current medication regimen.  She  states no problems or intolerances with the Crestor 5 mg every other  day.  In the past she has had some new myalgias, but some LFT increases  on statin therapy.  However, she had previously also been on Aromasin an  aromatase inhibitor for her breast cancer which she has subsequently  discontinued because of intolerant side effects.  One of the adverse  events from this drug is increases in LFTs which potentially could have  been the reason for her elevated LFTs, and not the statin therapy.  On  Crestor her LFTs are within normal limits, slightly elevated GGT which  is lower since last visit.  Prior to this visit she did not get a new  lipid panel drawn.  We will recheck at followup visit in 3 months.   [name] does not follow any type of strict exercise regimen.  I  have, at last visit, encouraged her to increase.  She walks her small  dog approximately 1/4-mile a day, at last visit; and currently I have  been  encouraging her to increase that to 1/2-mile twice daily.  She says  that she cannot walk safely in her neighborhood very far, and will have  to do the same lap several times. She also states that she goes to a  nearby baseball field and lets her dog run as she walks.  I have  encouraged her, as the weather continues to be nicer, to take advantage  of this; and 3 or 4 days a week go to the baseball field and walk at a  brisk pace to get the full benefit of a cardiac workout.   Dietary indiscretion:  She seems to be cooking high-fat foods and  carbohydrates for her and her husband.  She says that she likes  vegetables; however, she seems to put a lot of sauces and butters on  them at home, as well as eating  out.  She also says that she has a  downfall for sweets, but eats very few chips, but her sweet tooth, she  could go a couple days without eating any sweets, and then she will have  3 or 4 sweets in the day.  Ice cream, chocolate candy, brownies,  cookies, etc.  We have reviewed a low-fat diet.  Have encouraged her to  decrease the snacks by half, to eat small amounts on a daily basis.  A  half of a piece of candy bar, half of a cookie on a more daily basis,  instead of eating large amounts every 2-3 days.   She does say that she understands and knows what she needs to do;  however, implementing this into her daily activities could be a  challenge.  I will continue to encourage a heart-healthy diet and  exercise regimen.   PLAN:  1. Increase Crestor to 5 mg every day.  2. Encourage a low-fat diet, and increase exercise regimen.  3. Followup visit in 3 months for lipid panel and LFTs, make      adjustments at that time.      Leota Sauers, PharmD  Electronically Signed      Jesse Sans. Daleen Squibb, MD, [facility]  Electronically Signed   LC/MedQ  DD: 09/14/2007  DT: 09/14/2007  Job #: [id number]

## 2010-11-10 NOTE — Cardiovascular Report (Signed)
[name], BEMAN NO.:  [id number]   MEDICAL RECORD NO.:  [id number]           PATIENT TYPE:   LOCATION:                                 FACILITY:   PHYSICIAN:  Bevelyn Buckles. Bensimhon, MDDATE OF BIRTH:  1945/08/14   DATE OF PROCEDURE:  06/12/2007  DATE OF DISCHARGE:                            CARDIAC CATHETERIZATION   PRIMARY CARE PHYSICIAN:  Dr. Victoriano Lain in [place], [place].   PATIENT IDENTIFICATION:  Gina Weber is a very pleasant 65 year old  woman with a history hypertension, hyperlipidemia and breast cancer.  She also has a history of fibromyalgias.  She has had multiple stress  tests for chest pain which all have been normal.  Once again, presented  with chest pain.  She ruled out for myocardial infarction with serial  cardiac enzymes.  EKG was normal.  Given her recurrent chest pain, she  was referred for a diagnostic angiography to definitively define her  coronary anatomy.   PROCEDURES PERFORMED:  1. Selective coronary angiography.  2. Left heart cath.  3. Left ventriculogram.  4. StarClose femoral artery closure.   DESCRIPTION OF PROCEDURE:  The risks and indication of procedure were  explained.  Consent was signed and placed on the chart.  The right groin  area was prepped and draped in routine sterile fashion, and anesthetized  with 1% local lidocaine.  A 5-French arterial sheath was placed in the  right femoral artery using the modified Seldinger technique.  Standard  catheters including a JL-4, JR-4, and angled pigtail were used for  procedure.  All catheter exchanges were made over the wire.  There were  no apparent complications.   Central aortic pressure is 149/81 with a mean of 110.  Left ventricular  pressure 157/5 with an end-diastolic pressure of 12.  There is no aortic  stenosis on pullback across the aortic valve.   Left main was short, angiographically normal.   Left anterior descending was a large vessel coursed to the  apex.  It  gave off 2 diagonals and 1 septal perforator.  Is angiographically  normal.   Left circumflex was a large dominant vessel, gave off a small obtuse  marginal 1, a large obtuse marginal 2, and a large posterior descending  artery, was angiographically normal.   Right coronary artery was a small nondominant vessel that gave rise to  an acute marginal branch.  Angiographically normal.   Left ventriculogram done in the right anterior oblique position showed  an ejection fraction 65% to 70% with no wall motion abnormalities.  There no evidence of significant mitral valve prolapse or mitral  regurgitation.   Of note, on injection of the patient's right femoral artery through the  sheath for placement of the closure device, she was noted to have some  mild to moderate disease in her right iliac system.  This was  nonobstructive.   ASSESSMENT:  1. Normal coronary arteries.  2. Normal left ventricular function.  3. Evidence of peripheral arterial disease as described above.   PLAN/DISCUSSION:  Given results of her catheterization,  I suspect her  chest pain is noncardiac.  She will be treated medically with aggressive  risk factor management.  She should be able to be discharged home today.      Bevelyn Buckles. Bensimhon, MD  Electronically Signed     DRB/MEDQ  D:  06/12/2007  T:  06/13/2007  Job:  [id number]

## 2010-11-10 NOTE — Discharge Summary (Signed)
NAMEOSCEOLA, Gina Weber NO.:  [id number]   MEDICAL RECORD NO.:  [id number]          PATIENT TYPE:  INP   LOCATION:  2038                         FACILITY:  [facility]   PHYSICIAN:  Bevelyn Buckles. Bensimhon, MDDATE OF BIRTH:  1945/11/03   DATE OF ADMISSION:  06/10/2007  DATE OF DISCHARGE:  06/12/2007                               DISCHARGE SUMMARY   PRIMARY CARE PHYSICIAN:  Dr. Sherrilee Gilles in [place], [place].   PROCEDURES:  Cardiac catheterization completed by Dr. Gala Romney  revealing normal coronary arteries and normal left ventricular function.   DISCHARGE DIAGNOSES:  1. Non-cardiac chest pain.  2. Fibromyalgia.  3. Hypertension.  4. Hypercholesterolemia.   HOSPITAL COURSE:  This is a very pleasant, 65 year old, Caucasian female  with complaints of 2 days of feeling extremely tired.  On the night of  admission, the patient was cooking supper when she became very short of  breath abruptly with left-sided chest heaviness like a tight knot  under her breast area.  The patient called 911 and nitroglycerin was  administered which relieved her pain.  She presented to the [facility] Emergency Department and was subsequently transferred to [facility].  The pain had been occurring intermittently for several  days prior to admission and worsening with exertion.  The patient was  pain free on arrival at [facility].   The patient was seen and examined by Dr. Oneita Hurt, fellow for Dr.  Olga Millers.  The patient was admitted to rule out a myocardial  infarction.  She was started on aspirin and Lovenox.  Her cardiac  enzymes were cycled.  She also had a fasting lipid panel ordered and  followed closely over the weekend.   The patient was seen by Dr. Olga Millers after being seen by Dr.  Lalla Brothers.  The cardiac enzymes were found to be negative with the EKG  revealing a normal sinus rhythm without evidence of ST or T wave  abnormalities.   The patient continued to have intermittent chest  discomfort with cardiac catheterization planned on June 12, 2007.   The cardiac catheterization was completed as described above.  Please  see Dr. Reuel Boom Bensimhon's thorough cardiac catheterization note for  more details.  During hospitalization, the patient's lisinopril was  discontinued.  She had Norvasc 5 mg one p.o. daily added and Cozaar 100  mg p.o. daily.  The patient was also started on Zocor 40 mg p.o. daily.   After the cardiac catheterization, the patient recovered well without  evidence of hematoma, bleeding, or signs of an infection.  The patient  recovered well and was discharged on new medications with a 1 month  follow-up with Dr. Jens Som and her primary care physician.   Labs on discharge included a sodium of 138, potassium 2.9, chloride 99,  CO2 27, BUN 8, creatinine 0.75, and glucose of 87 (potassium was  repleted today with 40 mEq).  The hemoglobin was 14.5, hematocrit was  42.1, WBC was 6,900, and platelets were 185,000.  The LDL was 179.  Cardiac enzymes were negative x3.  Discharge vital signs showed a blood pressure of 131/88, heart rate of  80, respirations of 15, and oxygen saturation of 99% on room air.   DISCHARGE MEDICATIONS:  1. Allegra one p.o. daily p.r.n.  2. Singulair 10 mg daily.  3. Nexium 40 mg daily.  4. Ambien 10 mg daily.  5. Amitriptyline 20 mg daily.  6. Paroxetine 30 mg daily.  7. Norvasc 5 mg daily (new prescription).  8. Zocor 40 mg daily (new prescription).  9. Cozaar 100 mg daily (new prescription).  10.HCTZ 12.5 mg daily (new prescription).   ALLERGIES:  1. Sulfa.  2. Cephalosporins.   FOLLOWUP:  1. The patient will follow-up with her primary care physician for      continued medical management.  2. The patient will follow-up with Dr. Olga Millers on July 17, 2007 for continued cardiac evaluation and then she is to be seen on      a p.r.n. basis.  3. The  patient was given post-cardiac catheterization instructions      with particular emphasis on the right groin site for evidence of a      hematoma, bleeding, or an infection.  4. The patient has been advised of her new medications and to take her      blood pressure daily, bringing the results to her primary care      physician and to Dr. Jens Som at her follow-up appointments.   Time spent with the patient to include physician time was 45 minutes.      Bettey Mare. Lyman Bishop, NP      Bevelyn Buckles. Bensimhon, MD  Electronically Signed    KML/MEDQ  D:  06/12/2007  T:  06/13/2007  Job:  [id number]   cc:   Sherrilee Gilles, M.D.  Bevelyn Buckles. Bensimhon, MD

## 2010-11-10 NOTE — Assessment & Plan Note (Signed)
[facility]                               LIPID CLINIC NOTE   NAME:Gina Weber, Gina Weber                        MRN:          [id number]  DATE:08/03/2007                            DOB:          1945/08/02    REFERRING PHYSICIAN:  Madolyn Frieze. Jens Som, MD, [facility]   First office visit for lipid clinic.   PAST MEDICAL HISTORY:  Hyperlipidemia hypertension and fibromyalgia.   MEDICATIONS:  Paxil 30 mg daily, Nexium 40 mg bedtime, Singulair 10 mg  daily, Allegra 180 mg, Ambien 5 mg at bedtime, Cozaar 100 mg a day,  Norvasc 5 mg a day, hydrochlorothiazide 12.5 mg a day.   INTOLERANCE:  SIMVASTATIN 40 mg, LIPITOR and PRAVACHOL, all caused  elevated LFTs per the patient.   VITAL SIGNS:  Weight 172 pounds, blood pressure 130/68, heart rate 80.   LABORATORY DATA:  Total cholesterol 243, triglyceride 285, LDL 138, HDL  49.  Last LFTs were elevated while on simvastatin, ALT 56, AST 29.   ASSESSMENT:  Ms. Gina Weber is a very pleasant 65 year old female who was  recently admitted to [facility] June 10, 2007 with chest  pain.  While there she underwent a cardiac catheterization that showed  normal coronary vessels and ejection fraction of about 65% and evidence  of peripheral arterial disease with mild to moderate disease of her  right iliac system.  She denies any myalgias past Statin use.  However  she does indicate that her physician has stopped all statins in the past  because of elevated LFTs.  She is amenable to try Crestor or given the  evidence of data that statin therapy.  Will decrease risk of MI, stroke  and an decrease mortality.  We had a lengthy discussion regarding  medication, different classes of medications and the benefit of each  class, and her lipid goals.  Her total cholesterol of greater than goal  of less than 200, triglycerides and greater than goal of less than 150,  LDL greater than goal of less than 102 but will be tried again is  close  to 70 as possible. HDL goal of less than goal of greater than 40.  Ms.  Gina Weber does very little exercise.  She says she occasionally she walks  her dog once a day walks about a quarter of a mile.  She seems to eat  very high fat, high carbohydrate diet and loves her chocolate and ice  cream.  She eats bacon and eggs three times a week if not more but does  have the will to follow lifestyle modification.  She had previously  started niacin over-the-counter she mistakenly by niacin 100 mg instead  of 1000 mg tablets.  She has been taking these for a few weeks and I  have asked her to stop taking the niacin given her increased LFTs in the  past with Statins.  I want to see how she responds to a low dose Crestor  were without any other aggravating medications.  Knowing in the future  that we potentially would be to increase  Crestor or add on other  medications and she is agreeable to this in the future.   PLAN:  1. Stop Niacin.  2. Crestor 5 mg every other day.  Samples were given.  3. Decrease fats in diet.  4. Decreased serving size of chocolate and ice cream on a daily basis.  5. Increase exercise to a quarter of a mile to half a mile twice a      day.  6. Follow-up visit in 6 weeks for LFTs and lipid panel and will make      adjustments as needed at that time.      Leota Sauers, PharmD  Electronically Signed      Jesse Sans. Daleen Squibb, MD, [facility]  Electronically Signed   LC/MedQ  DD: 08/03/2007  DT: 08/04/2007  Job #: [id number]

## 2010-11-10 NOTE — Assessment & Plan Note (Signed)
[facility]                            CARDIOLOGY OFFICE NOTE   NAME:Gina Weber, Gina Weber                        MRN:          [id number]  DATE:09/22/2007                            DOB:          1946/06/06    Ms. Gina Weber is a pleasant female who I recently saw for atypical chest  pain.  She also underwent cardiac catheterization in December.  Her  ejection fraction was normal.  There was no coronary disease noted.  She  has been followed in the lipid clinic for hyperlipidemia.  She was  restarted on Crestor, but she is now complaining of myalgias and  arthralgias.  She has not had shortness of breath or chest pain.   MEDICATIONS:  1. Paxil 30 mg p.o. daily.  2. Nexium 40 mg p.o. daily.  3. Singulair.  4. Fexofenadine.  5. Ambien.  6. Cozaar 100 mg p.o. daily.  7. Norvasc 10 mg p.o. daily.  8. Hydrochlorothiazide 12.5 mg every other day.  9. Aspirin 81 mg p.o. daily.  10.Crestor 5 mg p.o. daily.   PHYSICAL EXAMINATION:  VITAL SIGNS:  Blood pressure 121/81, pulse 85,  weighs 172 pounds.  HEENT:  Normal.  NECK:  Supple.  CHEST:  Clear.  CARDIOVASCULAR:  Regular rate and rhythm.  ABDOMEN:  Shows no tenderness.  EXTREMITIES:  No edema.   DIAGNOSES:  1. Hypertension - her blood pressure is adequately controlled on her      present medications.  We will continue with her Cozaar, HCTZ, and      Norvasc.  2. Recent chest pain - her catheterization showed no coronary disease.  3. Hyperlipidemia - she will follow up in the lipid clinic for this      issue.  We will stop her Crestor as she is caught having myalgias      and arthralgias.  4. History of elevated liver functions - they had improved on her most      recent evaluation.   We will refer her to one of her primary care physicians.  I will see her  back on an as needed basis.     Madolyn Frieze Jens Som, MD, [facility]  Electronically Signed    BSC/MedQ  DD: 09/22/2007  DT: 09/23/2007  Job #:  [id number]

## 2010-11-10 NOTE — H&P (Signed)
Gina Weber, Gina Weber.:  [id number]   MEDICAL RECORD NO.:  [id number]          PATIENT TYPE:  INP   LOCATION:  2038                         FACILITY:  [facility]   PHYSICIAN:  Christell Faith, MD   DATE OF BIRTH:  May 04, 1946   DATE OF ADMISSION:  06/10/2007  DATE OF DISCHARGE:                              HISTORY & PHYSICAL   PRIMARY CARE PHYSICIAN:  Sherrilee Gilles,  MD, [place], [place].   CHIEF COMPLAINT:  Chest pain.   HISTORY OF PRESENT ILLNESS:  This is a 65 year old white female with 2  days of feeling extremely tired.  On the night of admission while  cooking supper, she abruptly became very short of breath with left-sided  chest heaviness, like a tight knot under her breast area.  Her  husband's oxygen was utilized but did not provide relief; 9-1-1 was  called, and nitroglycerin was administered which did resolve her pain.  She then presented to [facility] emergency department and was  transferred to [facility]. She says her pain had been occurring  intermittently for several days and is worse with exertion.  She is  currently pain free.   PAST MEDICAL HISTORY:  1. Fibromyalgia.  She collects disability secondary to this.  2. Hypertension.  3. Hyperlipidemia.  4. Depression.  5. Asthma.  6. Chest pain syndrome evaluated with multiple stress tests by Dr.      Hyacinth Meeker in [place] as well as at [facility] in [place], all negative      according to the patient.  7. Bilateral mastectomy in 1987 for breast cancer.  Breast cancer      recurred in 2006 and was treated with 6 weeks of radiation therapy.   SOCIAL HISTORY:  Lives in [place], [place] with her husband and son.  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  She has never smoked  and never drank.   FAMILY HISTORY:  Mother died of esophageal cancer.  Father died of a  ruptured aneurysm.  The patient is an only child.   ALLERGIES:  ERYTHROMYCIN and SULFA.   HOME MEDICATIONS:  1. Allegra 180  mg p.o. daily.  2. Aspirin 81 mg p.o. daily.  3. Amitriptyline 10 mg p.o. nightly.  4. Lisinopril 20 mg p.o. daily.  5. Hydrochlorothiazide 25 mg p.o. daily.  6. Nexium 40 mg p.o. daily.  7. Paxil 40 mg p.o. daily.  8. Singulair 10 mg p.o. daily.  9. Ambien as needed for sleep.   REVIEW OF SYSTEMS:  Positive for sore throat, runny nose, chest pain,  shortness of breath, dyspnea on exertion, cough, depression, weakness,  arthralgias, joint pain, nausea,  GERD symptoms, and constipation.  A  cough has been present for about 8-10 months and is worse at night.   PHYSICAL EXAMINATION:  VITAL SIGNS:  Temperature 97.9, pulse 88,  respiratory rate 18, blood pressure 141/85.  Saturation 100% on 2  liters.  GENERAL:  This is a pleasant older white female in no acute distress.  HEENT:  Pupils equal, round, and reactive. Sclerae clear.  Extraocular  movements  intact.  Mucous membranes moist.  NECK:  Supple.  Neck veins are flat.  No carotid bruits.  No cervical  lymphadenopathy.  CARDIAC:  Normal rate, regular rhythm, no murmurs.  Normal S1 and S2, no  gallops; 2+ dorsalis pedis and radial pulses bilaterally.  LUNGS:  Clear to auscultation bilaterally without wheezing or rales.  ABDOMEN: Soft, nontender, nondistended, with normal bowel sounds.  EXTREMITIES:  NO clubbing, cyanosis, or edema.  MUSCULOSKELETAL:  No acute joint effusions or deformities.  NEUROLOGIC:  Awake, alert,  oriented x3.  Facial expression symmetric,  5/5 strength in all four extremities.  SKIN:  Exam positive for bilateral mastectomy scars, no rash.   DIAGNOSTIC TESTS:  Chest x-ray result pending from [facility].   Electrocardiogram:  Rate 75, normal sinus rhythm, no ischemic ST or T  wave changes.   Labs: White blood cells 6.9, hemoglobin 14.5, platelets 185.  Sodium  136, potassium 3.4, BUN 10,  creatinine 0.8. CK-MB less than 1, troponin  less than 0.05.   IMPRESSION:  This is a 65 year old white female with  fibromyalgia and  recurrent chest pain on and off for many years with several prior  negative stress tests, now presenting again with chest pain for the past  few days.   PLAN:  1. Admit to telemetry.  2. Rule out myocardial infarction by cycling cardiac enzymes and EKGs.  3. Treat with aspirin and Lovenox.  Lovenox will be discontinued with      second set of negative cardiac enzymes.  4. Her cough has been present ever since starting ACE inhibitor.  We      will substitute Cozaar for the lisinopril and see if her cough      improves.  5. We need to get the report of her chest x-ray placed on the chart.      If not, then we will have to repeat it.  6. Check fasting lipid panel.  She states that she has high      cholesterol, but it is noted that she is not on cholesterol      medicine and may benefit from this.  7. She will need further cardiac testing, either stress test or      catheterization.  Consider catheterization given multiple      previously negative stress tests.      Christell Faith, MD  Electronically Signed    NDL/MEDQ  D:  06/10/2007  T:  06/11/2007  Job:  [id number]

## 2010-11-16 ENCOUNTER — Encounter: Payer: Self-pay | Admitting: Internal Medicine

## 2010-11-17 ENCOUNTER — Ambulatory Visit (INDEPENDENT_AMBULATORY_CARE_PROVIDER_SITE_OTHER): Payer: Medicare (Managed Care) | Admitting: Internal Medicine

## 2010-11-17 ENCOUNTER — Encounter: Payer: Self-pay | Admitting: Internal Medicine

## 2010-11-17 DIAGNOSIS — I10 Essential (primary) hypertension: Secondary | ICD-10-CM

## 2010-11-17 DIAGNOSIS — G4710 Hypersomnia, unspecified: Secondary | ICD-10-CM

## 2010-11-17 DIAGNOSIS — E785 Hyperlipidemia, unspecified: Secondary | ICD-10-CM

## 2010-11-17 LAB — LIPID PANEL
Cholesterol: 279 mg/dL — ABNORMAL HIGH (ref 0–200)
Total CHOL/HDL Ratio: 5
Triglycerides: 238 mg/dL — ABNORMAL HIGH (ref 0.0–149.0)
VLDL: 47.6 mg/dL — ABNORMAL HIGH (ref 0.0–40.0)

## 2010-11-17 LAB — HEPATIC FUNCTION PANEL
Albumin: 4.2 g/dL (ref 3.5–5.2)
Total Protein: 6.7 g/dL (ref 6.0–8.3)

## 2010-11-17 LAB — LDL CHOLESTEROL, DIRECT: Direct LDL: 219.3 mg/dL

## 2010-11-17 NOTE — Assessment & Plan Note (Signed)
Refer for sleep study

## 2010-11-17 NOTE — Progress Notes (Signed)
  Subjective:    Patient ID: Gina Weber, female    DOB: [birth year], 65 y.o.   MRN: [id number]  HPI  Fatigue---she describes profound sleepiness. Ongoing for months. Says she snores a lot. She describes daytime sleepiness.  She was evaluated for fatigue at ED---reviewed labs  htn---tolerating meds  Lipids---tolerating meds  Past Medical History  Diagnosis Date  . Fibromyalgia   . Arthritis   . Allergy   . Hyperlipidemia   . Hypertension   . GERD (gastroesophageal reflux disease)   . Depression   . COPD (chronic obstructive pulmonary disease)   . Anxiety   . Diverticulosis   . Breast cancer 1987 & 2006    radiation therapy   Past Surgical History  Procedure Date  . Abdominal hysterectomy   . Mastectomy 1987    bilateral f/u reconstruction  . Cataract extraction     left eye  . Appendectomy     reports that she has never smoked. She does not have any smokeless tobacco history on file. She reports that she does not drink alcohol or use illicit drugs. family history includes Aneurysm in her father; Cancer (age of onset:62) in her mother; Depression in her son; and Heart disease in her father.  There is no history of Colon cancer. Allergies  Allergen Reactions  . Pravastatin Sodium     REACTION: liver enzyme abnormality---per pt's report  . Statins     REACTION: leg pain, elevates liver enzymes  . Sulfamethoxazole     REACTION: hives     Review of Systems  patient denies chest pain, shortness of breath, orthopnea. Denies lower extremity edema, abdominal pain, change in appetite, change in bowel movements. Patient denies rashes, musculoskeletal complaints. No other specific complaints in a complete review of systems.      Objective:   Physical Exam  Well-developed well-nourished female in no acute distress. HEENT exam atraumatic, normocephalic, extraocular muscles are intact. Neck is supple. No jugular venous distention no thyromegaly. Chest clear to auscultation  without increased work of breathing. Cardiac exam S1 and S2 are regular. Abdominal exam active bowel sounds, soft, nontender. Extremities no edema. Neurologic exam she is alert without any motor sensory deficits. Gait is normal.     Assessment & Plan:

## 2010-11-17 NOTE — Assessment & Plan Note (Signed)
Adequate control Continue same meds 

## 2010-11-30 ENCOUNTER — Ambulatory Visit: Payer: Medicare (Managed Care) | Attending: Internal Medicine

## 2010-11-30 DIAGNOSIS — G4733 Obstructive sleep apnea (adult) (pediatric): Secondary | ICD-10-CM | POA: Insufficient documentation

## 2010-12-02 ENCOUNTER — Other Ambulatory Visit: Payer: Self-pay | Admitting: *Deleted

## 2010-12-02 MED ORDER — FLUTICASONE-SALMETEROL 100-50 MCG/DOSE IN AEPB: 1.0000 | INHALATION_SPRAY | Freq: Two times a day (BID) | RESPIRATORY_TRACT | Status: DC

## 2010-12-11 NOTE — Procedures (Signed)
Gina Weber, Gina Weber                 ACCOUNT NO.:  [id number]  MEDICAL RECORD NO.:  [id number]          PATIENT TYPE:  OUT  LOCATION:  SLEEP LAB                     FACILITY:  [facility]  PHYSICIAN:  Jaelynn Currier A. Gerilyn Pilgrim, M.D. DATE OF BIRTH:  1945/10/03  DATE OF STUDY:  11/30/2010                           NOCTURNAL POLYSOMNOGRAM  REFERRING PHYSICIAN:  Laruth Bouchard SWORDS  REFERRING PHYSICIAN:  Valetta Mole. Swords, MD  INDICATION FOR STUDY:  This is a 65 year old who presents with hypersomnia and snoring.  The study is being done to evaluate for obstructive sleep apnea syndrome.  EPWORTH SLEEPINESS SCORE: 1. BMI 27.  MEDICATIONS:  Singulair, amitriptyline, Xanax, Nexium, Paxil, and Cozaar.  SLEEP ARCHITECTURE:  Architectural summary:  Total recording time is 401 minutes.  Sleep efficiency 78%, sleep latency 27 minutes, REM latency 345 minutes.  Stage N1 12.2%, N2 70.5%, N3 16.5%, and REM sleep 0.8%.  RESPIRATORY DATA:  Respiratory summary:  Baseline oxygen saturation 98, lowest saturation 87 during non-REM sleep.  Diagnostic AHI 7.4 and RDI also 7.4.  OXYGEN DATA:  CARDIAC DATA:  Electrocardiogram summary:  Average heart rate is 73 with no significant dysrhythmias observed.  MOVEMENT-PARASOMNIA:  Limb movement summary:  PLM index 30.1.  IMPRESSIONS-RECOMMENDATIONS:  Impression: 1. Severe periodic limb movement disorder sleep. 2. Mild obstructive sleep apnea syndrome, not requiring positive     pressure treatment.  Recommendations:  Consider dopamine agonist such as ReQuip or Mirapex for treatment of the nocturnal limb movement.     Denai Caba A. Gerilyn Pilgrim, M.D. Electronically Signed 12/11/2010 10:12:57    KAD/MEDQ  D:  12/10/2010 09:57:05  T:  12/11/2010 00:37:35  Job:  [id number]

## 2010-12-25 ENCOUNTER — Ambulatory Visit: Payer: Medicare (Managed Care) | Admitting: Internal Medicine

## 2010-12-29 ENCOUNTER — Encounter: Payer: Self-pay | Admitting: Internal Medicine

## 2010-12-29 ENCOUNTER — Ambulatory Visit (INDEPENDENT_AMBULATORY_CARE_PROVIDER_SITE_OTHER): Payer: Medicare (Managed Care) | Admitting: Internal Medicine

## 2010-12-29 VITALS — BP 132/68 | Temp 98.3°F | Wt 163.0 lb

## 2010-12-29 DIAGNOSIS — G2581 Restless legs syndrome: Secondary | ICD-10-CM

## 2010-12-29 MED ORDER — PRAMIPEXOLE DIHYDROCHLORIDE 1 MG PO TABS: 1.0000 mg | ORAL_TABLET | Freq: Every day | ORAL | Status: DC

## 2010-12-29 NOTE — Assessment & Plan Note (Signed)
Reviewed sleep study Start mirapex --side effects discussed

## 2010-12-29 NOTE — Progress Notes (Signed)
  Subjective:    Patient ID: Gina Weber, female    DOB: 31-Mar-1946, 65 y.o.   MRN: [id number]  HPI F/u sleep study Patient noted to have profound leg movement disorder mostly pain. She complains of hypersomnia during the day and lack of sleep at night.  Past Medical History  Diagnosis Date  . Fibromyalgia   . Arthritis   . Allergy   . Hyperlipidemia   . Hypertension   . GERD (gastroesophageal reflux disease)   . Depression   . COPD (chronic obstructive pulmonary disease)   . Anxiety   . Diverticulosis   . Breast cancer 1987 & 2006    radiation therapy   Past Surgical History  Procedure Date  . Abdominal hysterectomy   . Mastectomy 1987    bilateral f/u reconstruction  . Cataract extraction     left eye  . Appendectomy     reports that she has never smoked. She does not have any smokeless tobacco history on file. She reports that she does not drink alcohol or use illicit drugs. family history includes Aneurysm in her father; Cancer (age of onset:62) in her mother; Depression in her son; and Heart disease in her father.  There is no history of Colon cancer. Allergies  Allergen Reactions  . Pravastatin Sodium     REACTION: liver enzyme abnormality---per pt's report  . Statins     REACTION: leg pain, elevates liver enzymes  . Sulfamethoxazole     REACTION: hives     Review of Systems    patient denies chest pain, shortness of breath, orthopnea. Denies lower extremity edema, abdominal pain, change in appetite, change in bowel movements. Patient denies rashes, musculoskeletal complaints. No other specific complaints in a complete review of systems.    Objective:   Physical Exam Well-developed female in no acute distress. Neck supple. Cardiac exam S1-S2 are regular.       Assessment & Plan:

## 2011-01-15 ENCOUNTER — Other Ambulatory Visit: Payer: Self-pay | Admitting: *Deleted

## 2011-01-15 MED ORDER — ALPRAZOLAM 0.5 MG PO TABS: 0.5000 mg | ORAL_TABLET | Freq: Every evening | ORAL | Status: DC | PRN

## 2011-02-01 ENCOUNTER — Other Ambulatory Visit: Payer: Self-pay | Admitting: *Deleted

## 2011-02-01 MED ORDER — AMITRIPTYLINE HCL 10 MG PO TABS: 20.0000 mg | ORAL_TABLET | Freq: Every day | ORAL | Status: DC

## 2011-02-15 ENCOUNTER — Other Ambulatory Visit: Payer: Self-pay | Admitting: Internal Medicine

## 2011-03-02 ENCOUNTER — Ambulatory Visit: Payer: Medicare (Managed Care) | Admitting: Internal Medicine

## 2011-04-02 LAB — BASIC METABOLIC PANEL
BUN: 8
Calcium: 8.8
GFR calc non Af Amer: >60
Glucose, Bld: 87
Potassium: 2.9 — ABNORMAL LOW
Sodium: 138

## 2011-04-02 LAB — PROTIME-INR: Prothrombin Time: 13.1

## 2011-04-02 LAB — APTT: aPTT: 48 — ABNORMAL HIGH

## 2011-04-05 LAB — CBC
HCT: 42.1
Hemoglobin: 14.4
Hemoglobin: 14.5
MCV: 89.7
MCV: 90
RBC: 4.54
RBC: 4.68
WBC: 5.1
WBC: 6.9

## 2011-04-05 LAB — POCT CARDIAC MARKERS
Operator id: 106841
Troponin i, poc: <0.05

## 2011-04-05 LAB — LIPID PANEL
LDL Cholesterol: 179 — ABNORMAL HIGH
Total CHOL/HDL Ratio: 7.6
VLDL: 46 — ABNORMAL HIGH

## 2011-04-05 LAB — CARDIAC PANEL(CRET KIN+CKTOT+MB+TROPI)
CK, MB: 1.6
Relative Index: INVALID
Relative Index: INVALID
Relative Index: INVALID
Total CK: 66
Total CK: 70
Troponin I: 0.02

## 2011-04-05 LAB — DIFFERENTIAL
Eosinophils Absolute: 0.1 — ABNORMAL LOW
Eosinophils Relative: 2
Lymphocytes Relative: 14
Lymphs Abs: 0.9
Monocytes Absolute: 0.8
Monocytes Relative: 11

## 2011-04-05 LAB — B-NATRIURETIC PEPTIDE (CONVERTED LAB): Pro B Natriuretic peptide (BNP): <32

## 2011-04-05 LAB — BASIC METABOLIC PANEL
BUN: 10
Chloride: 103
GFR calc Af Amer: >60
GFR calc non Af Amer: >60
Potassium: 3.4 — ABNORMAL LOW
Sodium: 136

## 2011-04-05 LAB — MAGNESIUM: Magnesium: 2.5

## 2011-04-06 ENCOUNTER — Other Ambulatory Visit: Payer: Self-pay | Admitting: Internal Medicine

## 2011-04-09 ENCOUNTER — Ambulatory Visit: Payer: Medicare (Managed Care) | Admitting: Internal Medicine

## 2011-04-23 ENCOUNTER — Other Ambulatory Visit: Payer: Self-pay | Admitting: *Deleted

## 2011-04-23 MED ORDER — ALPRAZOLAM 0.5 MG PO TABS: 0.5000 mg | ORAL_TABLET | Freq: Every evening | ORAL | Status: DC | PRN

## 2011-05-14 ENCOUNTER — Other Ambulatory Visit: Payer: Self-pay | Admitting: Internal Medicine

## 2011-05-21 ENCOUNTER — Other Ambulatory Visit: Payer: Self-pay | Admitting: Internal Medicine

## 2011-05-21 ENCOUNTER — Ambulatory Visit: Payer: Medicare (Managed Care) | Admitting: Internal Medicine

## 2011-05-24 ENCOUNTER — Other Ambulatory Visit: Payer: Self-pay | Admitting: *Deleted

## 2011-05-24 MED ORDER — ALBUTEROL SULFATE HFA 108 (90 BASE) MCG/ACT IN AERS: 2.0000 | INHALATION_SPRAY | Freq: Four times a day (QID) | RESPIRATORY_TRACT | Status: DC | PRN

## 2011-05-25 ENCOUNTER — Other Ambulatory Visit: Payer: Self-pay | Admitting: Internal Medicine

## 2011-07-12 ENCOUNTER — Other Ambulatory Visit: Payer: Self-pay | Admitting: Internal Medicine

## 2011-07-14 ENCOUNTER — Ambulatory Visit: Payer: Medicare (Managed Care) | Admitting: Internal Medicine

## 2011-07-14 ENCOUNTER — Encounter (HOSPITAL_COMMUNITY): Payer: Self-pay | Admitting: *Deleted

## 2011-07-14 ENCOUNTER — Emergency Department (HOSPITAL_COMMUNITY): Payer: PRIVATE HEALTH INSURANCE

## 2011-07-14 ENCOUNTER — Emergency Department (HOSPITAL_COMMUNITY)
Admission: EM | Admit: 2011-07-14 | Discharge: 2011-07-14 | Disposition: A | Discharge status: Home / Self Care | Payer: PRIVATE HEALTH INSURANCE | Attending: Emergency Medicine | Admitting: Emergency Medicine

## 2011-07-14 DIAGNOSIS — Z9079 Acquired absence of other genital organ(s): Secondary | ICD-10-CM | POA: Insufficient documentation

## 2011-07-14 DIAGNOSIS — F3289 Other specified depressive episodes: Secondary | ICD-10-CM | POA: Insufficient documentation

## 2011-07-14 DIAGNOSIS — IMO0001 Reserved for inherently not codable concepts without codable children: Secondary | ICD-10-CM | POA: Insufficient documentation

## 2011-07-14 DIAGNOSIS — N390 Urinary tract infection, site not specified: Secondary | ICD-10-CM | POA: Insufficient documentation

## 2011-07-14 DIAGNOSIS — M129 Arthropathy, unspecified: Secondary | ICD-10-CM | POA: Insufficient documentation

## 2011-07-14 DIAGNOSIS — I10 Essential (primary) hypertension: Secondary | ICD-10-CM | POA: Insufficient documentation

## 2011-07-14 DIAGNOSIS — F329 Major depressive disorder, single episode, unspecified: Secondary | ICD-10-CM | POA: Insufficient documentation

## 2011-07-14 DIAGNOSIS — Z853 Personal history of malignant neoplasm of breast: Secondary | ICD-10-CM | POA: Insufficient documentation

## 2011-07-14 DIAGNOSIS — K219 Gastro-esophageal reflux disease without esophagitis: Secondary | ICD-10-CM | POA: Insufficient documentation

## 2011-07-14 DIAGNOSIS — Z9889 Other specified postprocedural states: Secondary | ICD-10-CM | POA: Insufficient documentation

## 2011-07-14 DIAGNOSIS — R10819 Abdominal tenderness, unspecified site: Secondary | ICD-10-CM | POA: Insufficient documentation

## 2011-07-14 DIAGNOSIS — Z9849 Cataract extraction status, unspecified eye: Secondary | ICD-10-CM | POA: Insufficient documentation

## 2011-07-14 DIAGNOSIS — R109 Unspecified abdominal pain: Secondary | ICD-10-CM | POA: Insufficient documentation

## 2011-07-14 DIAGNOSIS — J449 Chronic obstructive pulmonary disease, unspecified: Secondary | ICD-10-CM | POA: Insufficient documentation

## 2011-07-14 DIAGNOSIS — F411 Generalized anxiety disorder: Secondary | ICD-10-CM | POA: Insufficient documentation

## 2011-07-14 DIAGNOSIS — J4489 Other specified chronic obstructive pulmonary disease: Secondary | ICD-10-CM | POA: Insufficient documentation

## 2011-07-14 DIAGNOSIS — Z9010 Acquired absence of unspecified breast and nipple: Secondary | ICD-10-CM | POA: Insufficient documentation

## 2011-07-14 DIAGNOSIS — K5900 Constipation, unspecified: Secondary | ICD-10-CM | POA: Insufficient documentation

## 2011-07-14 DIAGNOSIS — R197 Diarrhea, unspecified: Secondary | ICD-10-CM | POA: Insufficient documentation

## 2011-07-14 DIAGNOSIS — E785 Hyperlipidemia, unspecified: Secondary | ICD-10-CM | POA: Insufficient documentation

## 2011-07-14 LAB — URINALYSIS, ROUTINE W REFLEX MICROSCOPIC
Hgb urine dipstick: NEGATIVE
Nitrite: NEGATIVE
Protein, ur: NEGATIVE mg/dL
Urobilinogen, UA: 0.2 mg/dL (ref 0.0–1.0)

## 2011-07-14 LAB — DIFFERENTIAL
Eosinophils Absolute: 0.1 10*3/uL (ref 0.0–0.7)
Eosinophils Relative: 2 % (ref 0–5)
Lymphocytes Relative: 21 % (ref 12–46)
Lymphs Abs: 1.1 10*3/uL (ref 0.7–4.0)
Monocytes Absolute: 0.6 10*3/uL (ref 0.1–1.0)
Monocytes Relative: 11 % (ref 3–12)

## 2011-07-14 LAB — CBC
HCT: 37.8 % (ref 36.0–46.0)
MCH: 30.3 pg (ref 26.0–34.0)
MCV: 86.9 fL (ref 78.0–100.0)
RBC: 4.35 MIL/uL (ref 3.87–5.11)
WBC: 5 10*3/uL (ref 4.0–10.5)

## 2011-07-14 LAB — COMPREHENSIVE METABOLIC PANEL
ALT: 19 U/L (ref 0–35)
BUN: 11 mg/dL (ref 6–23)
CO2: 27 mEq/L (ref 19–32)
Calcium: 9.9 mg/dL (ref 8.4–10.5)
Creatinine, Ser: 0.74 mg/dL (ref 0.50–1.10)
GFR calc Af Amer: >90 mL/min (ref >90)
GFR calc non Af Amer: 87 mL/min — ABNORMAL LOW (ref >90)
Glucose, Bld: 94 mg/dL (ref 70–99)

## 2011-07-14 LAB — LACTIC ACID, PLASMA: Lactic Acid, Venous: 1 mmol/L (ref 0.5–2.2)

## 2011-07-14 LAB — LIPASE, BLOOD: Lipase: 23 U/L (ref 11–59)

## 2011-07-14 MED ORDER — SODIUM CHLORIDE 0.9 % IV SOLN
Freq: Once | INTRAVENOUS | Status: AC
  Administered 2011-07-14: 15:00:00 via INTRAVENOUS

## 2011-07-14 MED ORDER — CEPHALEXIN 500 MG PO CAPS: 500.0000 mg | ORAL_CAPSULE | Freq: Four times a day (QID) | ORAL | Status: AC

## 2011-07-14 MED ORDER — IOHEXOL 300 MG/ML  SOLN
100.0000 mL | Freq: Once | INTRAMUSCULAR | Status: AC | PRN
  Administered 2011-07-14: 100 mL via INTRAVENOUS

## 2011-07-14 NOTE — ED Notes (Signed)
Pt through drinking contrast. CT called.

## 2011-07-14 NOTE — ED Provider Notes (Signed)
History   This chart was scribed for Glynn Octave, MD by Clarita Crane. The patient was seen in room APA05/APA05 and the patient's care was started at 3:06PM.   CSN: [id number]  Arrival date & time 07/14/11  1425   First MD Initiated Contact with Patient 07/14/11 1455      Chief Complaint  Patient presents with  . Abdominal Pain    (Consider location/radiation/quality/duration/timing/severity/associated sxs/prior treatment) HPI Gina Weber is a 66 y.o. female who presents to the Emergency Department complaining of intermittent left sided abdominal pain onset 3 weeks ago and persistent since with associated alternating episodes of constipation and diarrhea. Patient states when she experiences episodes of diarrhea she will have approximately 2 episodes per day. Notes abdominal pain is aggravated with bowel movements.Denies nausea, vomiting, fever, hematuria, dysuria, chest pain, SOB. Patient with h/o IBS, abdominal hysterectomy, COPD. Patient is a non-smoker.   Past Medical History  Diagnosis Date  . Fibromyalgia   . Arthritis   . Allergy   . Hyperlipidemia   . Hypertension   . GERD (gastroesophageal reflux disease)   . Depression   . COPD (chronic obstructive pulmonary disease)   . Anxiety   . Diverticulosis   . Breast cancer 1987 & 2006    radiation therapy    Past Surgical History  Procedure Date  . Abdominal hysterectomy   . Mastectomy 1987    bilateral f/u reconstruction  . Cataract extraction     left eye  . Appendectomy     Family History  Problem Relation Age of Onset  . Cancer Mother 2    esophageal   . Aneurysm Father     AAA  . Heart disease Father   . Depression Son     bipolar  . Colon cancer Neg Hx     History  Substance Use Topics  . Smoking status: Never Smoker   . Smokeless tobacco: Not on file  . Alcohol Use: No    OB History    Grav Para Term Preterm Abortions TAB SAB Ect Mult Living                  Review of Systems 10  Systems reviewed and are negative for acute change except as noted in the HPI.  Allergies  Pravastatin sodium; Statins; and Sulfamethoxazole  Home Medications   Current Outpatient Rx  Name Route Sig Dispense Refill  . ALBUTEROL SULFATE HFA 108 (90 BASE) MCG/ACT IN AERS Inhalation Inhale 2 puffs into the lungs every 6 (six) hours as needed for wheezing. 18 g 5  . ALPRAZOLAM 0.5 MG PO TABS Oral Take 0.5 mg by mouth at bedtime as needed. For sleep    . AMITRIPTYLINE HCL 10 MG PO TABS  TAKE 2 TABLETS BY MOUTH AT BEDTIME 180 tablet 0  . AMLODIPINE BESYLATE 10 MG PO TABS      . FLUTICASONE-SALMETEROL 100-50 MCG/DOSE IN AEPB Inhalation Inhale 1 puff into the lungs every 12 (twelve) hours. 60 each 3  . HYDROCHLOROTHIAZIDE 12.5 MG PO CAPS Oral Take 12.5 mg by mouth daily as needed. For fluid retention    . LOSARTAN POTASSIUM 100 MG PO TABS  TAKE 1 TABLET AT NIGHT AS NEEDED 90 tablet 2  . MONTELUKAST SODIUM 10 MG PO TABS Oral Take 10 mg by mouth at bedtime.      Marland Kitchen NEXIUM 40 MG PO CPDR  TAKE ONE CAPSULE DAILY 90 capsule 29  . PAROXETINE HCL 40 MG PO TABS Oral Take 60  mg by mouth at bedtime.    Marland Kitchen ZOLPIDEM TARTRATE 5 MG PO TABS Oral Take 1 tablet (5 mg total) by mouth at bedtime as needed for sleep. 90 tablet 0  . CEPHALEXIN 500 MG PO CAPS Oral Take 1 capsule (500 mg total) by mouth 4 (four) times daily. 40 capsule 0    BP 138/76  Pulse 100  Temp(Src) 98 F (36.7 C) (Oral)  Resp 22  Ht 5\' 5"  (1.651 m)  Wt 160 lb (72.576 kg)  BMI 26.63 kg/m2  SpO2 100%  Physical Exam  Nursing note and vitals reviewed. Constitutional: She is oriented to person, place, and time. She appears well-developed and well-nourished. No distress.  HENT:  Head: Normocephalic and atraumatic.  Eyes: EOM are normal. Pupils are equal, round, and reactive to light.  Neck: Neck supple. No tracheal deviation present.  Cardiovascular: Normal rate and regular rhythm.  Exam reveals no gallop and no friction rub.   No murmur  heard. Pulmonary/Chest: Effort normal. No respiratory distress. She has no wheezes. She has no rales.  Abdominal: Soft. She exhibits no distension. There is tenderness in the suprapubic area. There is no rebound and no guarding.  Genitourinary: Guaiac negative stool.       Rectal exam chaperoned by female. No gross blood. One external hemorrhoid non-thrombosed with no fissure visualized.   Musculoskeletal: Normal range of motion. She exhibits no edema.  Neurological: She is alert and oriented to person, place, and time. No sensory deficit.  Skin: Skin is warm and dry.  Psychiatric: She has a normal mood and affect. Her behavior is normal.    ED Course  Procedures (including critical care time)  DIAGNOSTIC STUDIES: Oxygen Saturation is 100% on room air, normal by my interpretation.    COORDINATION OF CARE: 3:11PM- Patient explained current clinical impression and intent to obtain additional laboratory results and imaging. Patient agrees with plan set forth at this time.  3:15PM- Patient ordered IVFs along with accompanying lab results and CT-Abdomen Pelvis.  5:10PM- Patient informed of current lab and imaging results. Will d/c home with abx for UTI. Patient agrees with plan set forth at this time.   Labs Reviewed  COMPREHENSIVE METABOLIC PANEL - Abnormal; Notable for the following:    GFR calc non Af Amer 87 (*)    All other components within normal limits  URINALYSIS, ROUTINE W REFLEX MICROSCOPIC - Abnormal; Notable for the following:    Specific Gravity, Urine <1.005 (*)    Leukocytes, UA MODERATE (*)    All other components within normal limits  URINE MICROSCOPIC-ADD ON - Abnormal; Notable for the following:    Bacteria, UA FEW (*)    All other components within normal limits  CBC  DIFFERENTIAL  LIPASE, BLOOD  LACTIC ACID, PLASMA   Ct Abdomen Pelvis W Contrast  07/14/2011  *RADIOLOGY REPORT*  Clinical Data: Left lower quadrant pain, diarrhea, abdominal pain, history  hypertension, GERD, COPD, diverticulosis, breast cancer  CT ABDOMEN AND PELVIS WITH CONTRAST  Technique:  Multidetector CT imaging of the abdomen and pelvis was performed following the standard protocol during bolus administration of intravenous contrast. Sagittal and coronal MPR images reconstructed from axial data set.  Contrast: OMNIPAQUE IOHEXOL 300 MG/ML IV SOLN Dilute oral contrast.  Comparison: None  Findings: Lung bases clear. Tiny low attenuation lesions lateral segment left lobe liver question tiny cysts, largest 6 mm diameter. Tiny bilateral renal cysts. Liver, spleen, pancreas, kidneys, and adrenal glands otherwise unremarkable. Tiny umbilical hernia containing fat.  Retained food debris within stomach, confirmed with prone imaging. Uterus and appendix surgically absent with normal sized ovaries. Unremarkable bladder and ureters. Stomach and bowel loops normal appearance. Scattered pelvic phleboliths. No mass, adenopathy, free fluid, or inflammatory process. Degenerative disc disease changes L5-S1. No acute osseous findings.  IMPRESSION: Probable tiny hepatic and renal cysts. Tiny umbilical hernia. No acute intra abdominal or intrapelvic abnormalities identified.  Original Report Authenticated By: Lollie Marrow, M.D.     1. Abdominal pain   2. Urinary tract infection       MDM  3 weeks of intermittent left-sided abdominal pain associated with alternating constipation and diarrhea. No nausea, vomiting, fevers. No blood in the stool. No urinary symptoms  Urinalysis positive for infection. no other lab abnormalities noted.  No evidence of diverticulitis or diverticulosis on CT.  Abdomen soft patient tolerating by mouth. We'll treat for UTI and have followup with PCP.      I personally performed the services described in this documentation, which was scribed in my presence.  The recorded information has been reviewed and considered.    Glynn Octave, MD 07/14/11 [phone]

## 2011-07-14 NOTE — ED Notes (Signed)
abd pain, pain with bms

## 2011-07-14 NOTE — Discharge Instructions (Signed)
Abdominal Pain Abdominal pain can be caused by many things. Your caregiver decides the seriousness of your pain by an examination and possibly blood tests and X-rays. Many cases can be observed and treated at home. Most abdominal pain is not caused by a disease and will probably improve without treatment. However, in many cases, more time must pass before a clear cause of the pain can be found. Before that point, it may not be known if you need more testing, or if hospitalization or surgery is needed. HOME CARE INSTRUCTIONS   Do not take laxatives unless directed by your caregiver.   Take pain medicine only as directed by your caregiver.   Only take over-the-counter or prescription medicines for pain, discomfort, or fever as directed by your caregiver.   Try a clear liquid diet (broth, tea, or water) for as long as directed by your caregiver. Slowly move to a bland diet as tolerated.  SEEK IMMEDIATE MEDICAL CARE IF:   The pain does not go away.   You have a fever.   You keep throwing up (vomiting).   The pain is felt only in portions of the abdomen. Pain in the right side could possibly be appendicitis. In an adult, pain in the left lower portion of the abdomen could be colitis or diverticulitis.   You pass bloody or black tarry stools.  MAKE SURE YOU:   Understand these instructions.   Will watch your condition.   Will get help right away if you are not doing well or get worse.  Document Released: 03/24/2005 Document Revised: 02/24/2011 Document Reviewed: 01/31/2008 [employer] Patient Information 2012 [place], [place].Urinary Tract Infection Infections of the urinary tract can start in several places. A bladder infection (cystitis), a kidney infection (pyelonephritis), and a prostate infection (prostatitis) are different types of urinary tract infections (UTIs). They usually get better if treated with medicines (antibiotics) that kill germs. Take all the medicine until it is gone. You or  your child may feel better in a few days, but TAKE ALL MEDICINE or the infection may not respond and may become more difficult to treat. HOME CARE INSTRUCTIONS   Drink enough water and fluids to keep the urine clear or pale yellow. Cranberry juice is especially recommended, in addition to large amounts of water.   Avoid caffeine, tea, and carbonated beverages. They tend to irritate the bladder.   Alcohol may irritate the prostate.   Only take over-the-counter or prescription medicines for pain, discomfort, or fever as directed by your caregiver.  To prevent further infections:  Empty the bladder often. Avoid holding urine for long periods of time.   After a bowel movement, women should cleanse from front to back. Use each tissue only once.   Empty the bladder before and after sexual intercourse.  FINDING OUT THE RESULTS OF YOUR TEST Not all test results are available during your visit. If your or your child's test results are not back during the visit, make an appointment with your caregiver to find out the results. Do not assume everything is normal if you have not heard from your caregiver or the medical facility. It is important for you to follow up on all test results. SEEK MEDICAL CARE IF:   There is back pain.   Your baby is older than 3 months with a rectal temperature of 100.5 F (38.1 C) or higher for more than 1 day.   Your or your child's problems (symptoms) are no better in 3 days. Return sooner if  you or your child is getting worse.  SEEK IMMEDIATE MEDICAL CARE IF:   There is severe back pain or lower abdominal pain.   You or your child develops chills.   You have a fever.   Your baby is older than 3 months with a rectal temperature of 102 F (38.9 C) or higher.   Your baby is 39 months old or younger with a rectal temperature of 100.4 F (38 C) or higher.   There is nausea or vomiting.   There is continued burning or discomfort with urination.  MAKE SURE YOU:     Understand these instructions.   Will watch your condition.   Will get help right away if you are not doing well or get worse.  Document Released: 03/24/2005 Document Revised: 02/24/2011 Document Reviewed: 10/27/2006 [facility] Patient Information 2012 [place], [place].

## 2011-07-19 ENCOUNTER — Other Ambulatory Visit: Payer: Self-pay | Admitting: *Deleted

## 2011-07-19 MED ORDER — MONTELUKAST SODIUM 10 MG PO TABS: 10.0000 mg | ORAL_TABLET | Freq: Every day | ORAL | Status: DC

## 2011-07-30 ENCOUNTER — Ambulatory Visit (HOSPITAL_COMMUNITY)
Admission: RE | Admit: 2011-07-30 | Discharge: 2011-07-30 | Disposition: A | Discharge status: Home / Self Care | Payer: PRIVATE HEALTH INSURANCE | Source: Ambulatory Visit | Attending: Internal Medicine | Admitting: Internal Medicine

## 2011-07-30 ENCOUNTER — Ambulatory Visit (INDEPENDENT_AMBULATORY_CARE_PROVIDER_SITE_OTHER): Payer: PRIVATE HEALTH INSURANCE | Admitting: Internal Medicine

## 2011-07-30 DIAGNOSIS — M25519 Pain in unspecified shoulder: Secondary | ICD-10-CM | POA: Insufficient documentation

## 2011-07-30 DIAGNOSIS — Z853 Personal history of malignant neoplasm of breast: Secondary | ICD-10-CM

## 2011-07-30 DIAGNOSIS — R079 Chest pain, unspecified: Secondary | ICD-10-CM | POA: Insufficient documentation

## 2011-07-30 DIAGNOSIS — I10 Essential (primary) hypertension: Secondary | ICD-10-CM

## 2011-07-30 NOTE — Progress Notes (Signed)
Patient ID: Gina Weber, female   DOB: 05-18-1946, 66 y.o.   MRN: [id number]  Left sided subscapular pain- can be severe with certain twisting/moving actions.  Duration 1 month. Pain is intermittent but can occur daily.  HTN---tolerating meds  Breast cancer--no known recurrence  Past Medical History  Diagnosis Date  . Fibromyalgia   . Arthritis   . Allergy   . Hyperlipidemia   . Hypertension   . GERD (gastroesophageal reflux disease)   . Depression   . COPD (chronic obstructive pulmonary disease)   . Anxiety   . Diverticulosis   . Breast cancer 1987 & 2006    radiation therapy    History   Social History  . Marital Status: Married    Spouse Name: N/A    Number of Children: N/A  . Years of Education: N/A   Occupational History  . Not on file.   Social History Main Topics  . Smoking status: Never Smoker   . Smokeless tobacco: Not on file  . Alcohol Use: No  . Drug Use: No  . Sexually Active:    Other Topics Concern  . Not on file   Social History Narrative  . No narrative on file    Past Surgical History  Procedure Date  . Abdominal hysterectomy   . Mastectomy 1987    bilateral f/u reconstruction  . Cataract extraction     left eye  . Appendectomy     Family History  Problem Relation Age of Onset  . Cancer Mother 97    esophageal   . Aneurysm Father     AAA  . Heart disease Father   . Depression Son     bipolar  . Colon cancer Neg Hx     Allergies  Allergen Reactions  . Pravastatin Sodium     REACTION: liver enzyme abnormality---per pt's report  . Statins     REACTION: leg pain, elevates liver enzymes  . Sulfamethoxazole     REACTION: hives    Current Outpatient Prescriptions on File Prior to Visit  Medication Sig Dispense Refill  . albuterol (PROAIR HFA) 108 (90 BASE) MCG/ACT inhaler Inhale 2 puffs into the lungs every 6 (six) hours as needed for wheezing.  18 g  5  . ALPRAZolam (XANAX) 0.5 MG tablet Take 0.5 mg by mouth at bedtime as  needed. For sleep      . amitriptyline (ELAVIL) 10 MG tablet TAKE 2 TABLETS BY MOUTH AT BEDTIME  180 tablet  0  . amLODipine (NORVASC) 10 MG tablet Take 10 mg by mouth daily.       . Fluticasone-Salmeterol (ADVAIR DISKUS) 100-50 MCG/DOSE AEPB Inhale 1 puff into the lungs every 12 (twelve) hours.  60 each  3  . hydrochlorothiazide (,MICROZIDE/HYDRODIURIL,) 12.5 MG capsule Take 12.5 mg by mouth daily as needed. For fluid retention      . losartan (COZAAR) 100 MG tablet TAKE 1 TABLET AT NIGHT AS NEEDED  90 tablet  2  . montelukast (SINGULAIR) 10 MG tablet Take 1 tablet (10 mg total) by mouth at bedtime.  90 tablet  1  . NEXIUM 40 MG capsule TAKE ONE CAPSULE DAILY  90 capsule  29  . PARoxetine (PAXIL) 40 MG tablet Take 60 mg by mouth at bedtime.      Marland Kitchen zolpidem (AMBIEN) 5 MG tablet Take 1 tablet (5 mg total) by mouth at bedtime as needed for sleep.  90 tablet  0     patient denies chest pain,  shortness of breath, orthopnea. Denies lower extremity edema, abdominal pain, change in appetite, change in bowel movements. Patient denies rashes, musculoskeletal complaints. No other specific complaints in a complete review of systems.   BP 116/58  Pulse 76  Temp(Src) 98.2 F (36.8 C) (Oral)  Wt 160 lb (72.576 kg)  Well-developed well-nourished female in no acute distress. HEENT exam atraumatic, normocephalic, extraocular muscles are intact. Neck is supple. No jugular venous distention no thyromegaly. Chest clear to auscultation without increased work of breathing. Cardiac exam S1 and S2 are regular. Abdominal exam active bowel sounds, soft, nontender. Extremities no edema.

## 2011-07-30 NOTE — Assessment & Plan Note (Signed)
Given hx of breast cancer and new onset thoracic pain I'll check an xray

## 2011-07-30 NOTE — Assessment & Plan Note (Signed)
Well controlled. Continue current meds

## 2011-08-05 ENCOUNTER — Other Ambulatory Visit: Payer: Self-pay | Admitting: *Deleted

## 2011-08-05 MED ORDER — ALPRAZOLAM 0.5 MG PO TABS: 0.5000 mg | ORAL_TABLET | Freq: Every evening | ORAL | Status: DC | PRN

## 2011-08-18 ENCOUNTER — Other Ambulatory Visit: Payer: Self-pay

## 2011-08-18 MED ORDER — PAROXETINE HCL 40 MG PO TABS: 40.0000 mg | ORAL_TABLET | ORAL | Status: DC

## 2011-08-18 NOTE — Telephone Encounter (Signed)
Ok x one year 

## 2011-08-18 NOTE — Telephone Encounter (Signed)
Rx request for paxil 40 mg.  Pt last seen 07/30/11. Pls advise.

## 2011-09-29 ENCOUNTER — Other Ambulatory Visit: Payer: Self-pay | Admitting: Internal Medicine

## 2011-10-04 ENCOUNTER — Telehealth: Payer: Self-pay | Admitting: Internal Medicine

## 2011-10-04 ENCOUNTER — Telehealth: Payer: Self-pay | Admitting: Family Medicine

## 2011-10-04 ENCOUNTER — Ambulatory Visit: Payer: PRIVATE HEALTH INSURANCE | Admitting: Internal Medicine

## 2011-10-04 NOTE — Telephone Encounter (Signed)
Opened in error

## 2011-10-04 NOTE — Telephone Encounter (Signed)
confidential Office Message [address] [place], [place] [address] p. [phone] f. [phone] To: Lacey Jensen Fax: [phone] From: [facility] Date/ Time: 10/03/2011 10:51 PM Taken By: Forbes Cellar, CSR Caller: [place] Facility: not collected Patient: Gina Weber, Gina Weber DOB: [birth year] Phone: [phone] Reason for Call: See info below Regarding Appointment: Yes Appt Date: 10/03/2011 Appt Time: 9:00:00 AM Provider: Birdie Sons Reason: Details: Has no one to stay with her husband

## 2011-10-12 ENCOUNTER — Other Ambulatory Visit: Payer: Self-pay | Admitting: *Deleted

## 2011-10-12 MED ORDER — FLUTICASONE-SALMETEROL 100-50 MCG/DOSE IN AEPB: 1.0000 | INHALATION_SPRAY | Freq: Two times a day (BID) | RESPIRATORY_TRACT | Status: DC

## 2011-10-22 ENCOUNTER — Other Ambulatory Visit: Payer: Self-pay | Admitting: Internal Medicine

## 2011-10-26 ENCOUNTER — Emergency Department (HOSPITAL_COMMUNITY)
Admission: EM | Admit: 2011-10-26 | Discharge: 2011-10-26 | Disposition: A | Discharge status: Home / Self Care | Payer: PRIVATE HEALTH INSURANCE | Attending: Emergency Medicine | Admitting: Emergency Medicine

## 2011-10-26 ENCOUNTER — Encounter (HOSPITAL_COMMUNITY): Payer: Self-pay | Admitting: *Deleted

## 2011-10-26 DIAGNOSIS — Z853 Personal history of malignant neoplasm of breast: Secondary | ICD-10-CM | POA: Insufficient documentation

## 2011-10-26 DIAGNOSIS — K219 Gastro-esophageal reflux disease without esophagitis: Secondary | ICD-10-CM | POA: Insufficient documentation

## 2011-10-26 DIAGNOSIS — IMO0001 Reserved for inherently not codable concepts without codable children: Secondary | ICD-10-CM | POA: Insufficient documentation

## 2011-10-26 DIAGNOSIS — I10 Essential (primary) hypertension: Secondary | ICD-10-CM | POA: Insufficient documentation

## 2011-10-26 DIAGNOSIS — K111 Hypertrophy of salivary gland: Secondary | ICD-10-CM | POA: Insufficient documentation

## 2011-10-26 DIAGNOSIS — J449 Chronic obstructive pulmonary disease, unspecified: Secondary | ICD-10-CM | POA: Insufficient documentation

## 2011-10-26 DIAGNOSIS — Z79899 Other long term (current) drug therapy: Secondary | ICD-10-CM | POA: Insufficient documentation

## 2011-10-26 DIAGNOSIS — J4489 Other specified chronic obstructive pulmonary disease: Secondary | ICD-10-CM | POA: Insufficient documentation

## 2011-10-26 DIAGNOSIS — E785 Hyperlipidemia, unspecified: Secondary | ICD-10-CM | POA: Insufficient documentation

## 2011-10-26 NOTE — ED Notes (Signed)
Swelling right side of face with dizziness and nausea onset tonight, states right ear does hurt

## 2011-10-26 NOTE — ED Notes (Signed)
Into room to see patient. Resting sitting up in bed. States she was eating dinner at 1830 this evening and right jaw started swelling. Lower and upper jaw hard to touch and tender. Denies any shortness of breath more than normal (due to history of COPD). 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 States she feels a little dizzy. Has clear speech. No angioedema. Call bell within reach. Denies needs. Call bell within reach. Awaiting MD eval.

## 2011-10-26 NOTE — ED Notes (Signed)
Remains resting sitting up in bed. No distress. Equal chest rise and fall, regular, unlabored. Denies needs. Call bell within reach.

## 2011-10-26 NOTE — ED Provider Notes (Signed)
History    66 year old female with right facial swelling. Onset this evening while eating white beans. Gradually improving since onset. Denies pain. Denies history of similar symptoms. No difficulty breathing or swallowing. No rash. No abdominal pain, nausea or vomiting or diarrhea. Patient states a history of chronic dry mouth. CSN: [id number]  Arrival date & time 10/26/11  Barry Brunner   First MD Initiated Contact with Patient 10/26/11 1956      Chief Complaint  Patient presents with  . Facial Swelling    (Consider location/radiation/quality/duration/timing/severity/associated sxs/prior treatment) HPI  Past Medical History  Diagnosis Date  . Fibromyalgia   . Arthritis   . Allergy   . Hyperlipidemia   . Hypertension   . GERD (gastroesophageal reflux disease)   . Depression   . COPD (chronic obstructive pulmonary disease)   . Anxiety   . Diverticulosis   . Breast cancer 1987 & 2006    radiation therapy    Past Surgical History  Procedure Date  . Abdominal hysterectomy   . Mastectomy 1987    bilateral f/u reconstruction  . Cataract extraction     left eye  . Appendectomy     Family History  Problem Relation Age of Onset  . Cancer Mother 74    esophageal   . Aneurysm Father     AAA  . Heart disease Father   . Depression Son     bipolar  . Colon cancer Neg Hx     History  Substance Use Topics  . Smoking status: Never Smoker   . Smokeless tobacco: Not on file  . Alcohol Use: No    OB History    Grav Para Term Preterm Abortions TAB SAB Ect Mult Living                  Review of Systems   Review of symptoms negative unless otherwise noted in HPI.   Allergies  Pravastatin sodium; Statins; and Sulfamethoxazole  Home Medications   Current Outpatient Rx  Name Route Sig Dispense Refill  . ALBUTEROL SULFATE HFA 108 (90 BASE) MCG/ACT IN AERS Inhalation Inhale 2 puffs into the lungs every 6 (six) hours as needed for wheezing. 18 g 5  . ALPRAZOLAM 0.5 MG PO  TABS Oral Take 1 tablet (0.5 mg total) by mouth at bedtime as needed. For sleep 90 tablet 0  . AMITRIPTYLINE HCL 10 MG PO TABS  TAKE 2 TABLETS BY MOUTH AT BEDTIME 180 tablet 1  . AMLODIPINE BESYLATE 10 MG PO TABS Oral Take 10 mg by mouth daily.     Marland Kitchen FLUTICASONE-SALMETEROL 100-50 MCG/DOSE IN AEPB Inhalation Inhale 1 puff into the lungs every 12 (twelve) hours. 180 each 3  . HYDROCHLOROTHIAZIDE 12.5 MG PO CAPS Oral Take 12.5 mg by mouth daily as needed. For fluid retention    . LOSARTAN POTASSIUM 100 MG PO TABS      . MONTELUKAST SODIUM 10 MG PO TABS Oral Take 1 tablet (10 mg total) by mouth at bedtime. 90 tablet 1  . NEXIUM 40 MG PO CPDR  TAKE ONE CAPSULE DAILY 90 capsule 29  . PAROXETINE HCL 40 MG PO TABS Oral Take 40 mg by mouth at bedtime.    Marland Kitchen ZOLPIDEM TARTRATE 5 MG PO TABS Oral Take 1 tablet (5 mg total) by mouth at bedtime as needed for sleep. 90 tablet 0    BP 128/69  Pulse 95  Temp(Src) 97.9 F (36.6 C) (Oral)  Resp 18  Ht 5\' 5"  (1.651 m)  Wt 165 lb (74.844 kg)  BMI 27.46 kg/m2  SpO2 100%  Physical Exam  Nursing note and vitals reviewed. Constitutional: She appears well-developed and well-nourished. No distress.  HENT:  Head: Normocephalic and atraumatic.       Mild fullness in the right face. Parotid gland is enlarged. Nontender. No overlying skin changes. No swelling or pus at [facility] duct. No tongue elevation. Submental tissues are soft. No adenopathy. Neck supple. Patient's family secretions and posterior pharynx is clear.  Eyes: Conjunctivae are normal. Right eye exhibits no discharge. Left eye exhibits no discharge.  Neck: Neck supple.  Cardiovascular: Normal rate, regular rhythm and normal heart sounds.  Exam reveals no gallop and no friction rub.   No murmur heard. Pulmonary/Chest: Effort normal and breath sounds normal. No respiratory distress.  Abdominal: Soft. She exhibits no distension. There is no tenderness.  Musculoskeletal: She exhibits no edema and no  tenderness.  Neurological: She is alert.  Skin: Skin is warm and dry.  Psychiatric: She has a normal mood and affect. Her behavior is normal. Thought content normal.    ED Course  Procedures (including critical care time)  Labs Reviewed - No data to display No results found.   1. Parotid gland enlargement       MDM  66yF with mild swelling of R parotid gland. Onset while eating. No overlying skin changes, tenderness, fever to suggest bacterial parotitis.  Plan symptomatic treatment, specifically sialagogues. Term precautions were discussed.        Raeford Razor, MD 10/28/11 1430

## 2011-10-28 ENCOUNTER — Other Ambulatory Visit: Payer: Self-pay | Admitting: *Deleted

## 2011-10-28 MED ORDER — ALPRAZOLAM 0.5 MG PO TABS: 0.5000 mg | ORAL_TABLET | Freq: Every evening | ORAL | Status: DC | PRN

## 2011-11-09 ENCOUNTER — Encounter: Payer: Self-pay | Admitting: Family

## 2011-11-09 ENCOUNTER — Ambulatory Visit (INDEPENDENT_AMBULATORY_CARE_PROVIDER_SITE_OTHER): Payer: PRIVATE HEALTH INSURANCE | Admitting: Family

## 2011-11-09 ENCOUNTER — Telehealth: Payer: Self-pay | Admitting: Internal Medicine

## 2011-11-09 VITALS — BP 120/68 | HR 81 | Temp 98.5°F | Wt 166.0 lb

## 2011-11-09 DIAGNOSIS — F411 Generalized anxiety disorder: Secondary | ICD-10-CM

## 2011-11-09 DIAGNOSIS — F419 Anxiety disorder, unspecified: Secondary | ICD-10-CM

## 2011-11-09 DIAGNOSIS — M898X9 Other specified disorders of bone, unspecified site: Secondary | ICD-10-CM

## 2011-11-09 DIAGNOSIS — Z853 Personal history of malignant neoplasm of breast: Secondary | ICD-10-CM

## 2011-11-09 DIAGNOSIS — M545 Low back pain: Secondary | ICD-10-CM

## 2011-11-09 DIAGNOSIS — M79609 Pain in unspecified limb: Secondary | ICD-10-CM

## 2011-11-09 DIAGNOSIS — M79606 Pain in leg, unspecified: Secondary | ICD-10-CM

## 2011-11-09 DIAGNOSIS — M949 Disorder of cartilage, unspecified: Secondary | ICD-10-CM

## 2011-11-09 MED ORDER — OXYCODONE-ACETAMINOPHEN 5-325 MG PO TABS: 1.0000 | ORAL_TABLET | Freq: Three times a day (TID) | ORAL | Status: AC | PRN

## 2011-11-09 MED ORDER — PAROXETINE HCL 40 MG PO TABS: 40.0000 mg | ORAL_TABLET | Freq: Every day | ORAL | Status: DC

## 2011-11-09 NOTE — Telephone Encounter (Signed)
Pt called and is req to change of pcp from Dr Cato Mulligan to Adline Mango due to availability and pt would prefer to have a female doctor.  Pls advise if ok.

## 2011-11-09 NOTE — Telephone Encounter (Signed)
I am ok if it is ok with Dr. Cato Mulligan

## 2011-11-09 NOTE — Progress Notes (Signed)
Subjective:    Patient ID: Gina Weber, female    DOB: August 25, 1945, 66 y.o.   MRN: [id number]  HPI 66 year old white female, nonsmoker, patient of Dr. Cato Mulligan is in today with complaints of bone pain. She has generalized bone pain, particularly to her legs bilaterally, chest, tailbone and lower back. She has a history of breast cancer that has been in remission. It has reoccurred twice. Her last appointment with her oncologist was about 3-4 years ago. She's aware that she was supposed to have bone scans annually but has not had one in about 3-4 years. She has Vicodin and ibuprofen that she's taken for pain relief but has not helped. Pain is a 10 out of 10 she occasionally takes her husband's Percocet that helps her pain more.   Patient reports an increase in her stress in her life. She's taking care of her terminally ill husband. She recently ran out of her Paxil 40 mg. She also reports her home health aide recently committing suicide. Therefore, she's been under an enormous amount of stress.    Review of Systems  Constitutional: Negative.   Respiratory: Negative.   Cardiovascular: Negative.   Gastrointestinal: Negative.   Musculoskeletal: Positive for back pain, joint swelling and arthralgias.  Hematological: Negative.   Psychiatric/Behavioral: The patient is nervous/anxious.    Past Medical History  Diagnosis Date  . Fibromyalgia   . Arthritis   . Allergy   . Hyperlipidemia   . Hypertension   . GERD (gastroesophageal reflux disease)   . Depression   . COPD (chronic obstructive pulmonary disease)   . Anxiety   . Diverticulosis   . Breast cancer 1987 & 2006    radiation therapy    History   Social History  . Marital Status: Married    Spouse Name: N/A    Number of Children: N/A  . Years of Education: N/A   Occupational History  . Not on file.   Social History Main Topics  . Smoking status: Never Smoker   . Smokeless tobacco: Not on file  . Alcohol Use: No  . Drug Use:  No  . Sexually Active:    Other Topics Concern  . Not on file   Social History Narrative  . No narrative on file    Past Surgical History  Procedure Date  . Abdominal hysterectomy   . Mastectomy 1987    bilateral f/u reconstruction  . Cataract extraction     left eye  . Appendectomy     Family History  Problem Relation Age of Onset  . Cancer Mother 59    esophageal   . Aneurysm Father     AAA  . Heart disease Father   . Depression Son     bipolar  . Colon cancer Neg Hx     Allergies  Allergen Reactions  . Pravastatin Sodium     REACTION: liver enzyme abnormality---per pt's report  . Statins     REACTION: leg pain, elevates liver enzymes  . Sulfamethoxazole     REACTION: hives    Current Outpatient Prescriptions on File Prior to Visit  Medication Sig Dispense Refill  . albuterol (PROAIR HFA) 108 (90 BASE) MCG/ACT inhaler Inhale 2 puffs into the lungs every 6 (six) hours as needed for wheezing.  18 g  5  . ALPRAZolam (XANAX) 0.5 MG tablet Take 1 tablet (0.5 mg total) by mouth at bedtime as needed. For sleep  90 tablet  0  . amitriptyline (ELAVIL) 10 MG tablet  TAKE 2 TABLETS BY MOUTH AT BEDTIME  180 tablet  1  . amLODipine (NORVASC) 10 MG tablet Take 10 mg by mouth daily.       . Fluticasone-Salmeterol (ADVAIR DISKUS) 100-50 MCG/DOSE AEPB Inhale 1 puff into the lungs every 12 (twelve) hours.  180 each  3  . hydrochlorothiazide (,MICROZIDE/HYDRODIURIL,) 12.5 MG capsule Take 12.5 mg by mouth daily as needed. For fluid retention      . losartan (COZAAR) 100 MG tablet       . montelukast (SINGULAIR) 10 MG tablet Take 1 tablet (10 mg total) by mouth at bedtime.  90 tablet  1  . NEXIUM 40 MG capsule TAKE ONE CAPSULE DAILY  90 capsule  29  . DISCONTD: PARoxetine (PAXIL) 40 MG tablet Take 40 mg by mouth at bedtime.      Marland Kitchen zolpidem (AMBIEN) 5 MG tablet Take 1 tablet (5 mg total) by mouth at bedtime as needed for sleep.  90 tablet  0    BP 120/68  Pulse 81  Temp(Src)  98.5 F (36.9 C) (Oral)  Wt 166 lb (75.297 kg)  SpO2 98%chart    Objective:   Physical Exam  Constitutional: She is oriented to person, place, and time. She appears well-developed and well-nourished.  Neck: Normal range of motion. Neck supple.  Cardiovascular: Normal rate, regular rhythm and normal heart sounds.   Pulmonary/Chest: Effort normal and breath sounds normal.  Abdominal: Soft. Bowel sounds are normal.  Musculoskeletal: Normal range of motion.  Neurological: She is alert and oriented to person, place, and time.  Skin: Skin is warm.  Psychiatric: She has a normal mood and affect.          Assessment & Plan   Assessment: Bone Pain, History of Breast cancer with bilateral mastectomy, anxiety  Plan:  Percocet and reported 1 tablet every 8 hours when necessary pain.  full-body bone ordered. Resume Paxil 40 mg once a day we'll bring patient back for recheck in 3 weeks.

## 2011-11-10 NOTE — Telephone Encounter (Signed)
Please call pt and tell him ok and see if he need ov--thanks

## 2011-11-10 NOTE — Telephone Encounter (Signed)
ok 

## 2011-11-10 NOTE — Telephone Encounter (Signed)
Called and lft vm for pt notifying them that pcp change is ok per both.

## 2011-11-12 ENCOUNTER — Encounter (HOSPITAL_COMMUNITY)
Admission: RE | Admit: 2011-11-12 | Discharge: 2011-11-12 | Disposition: A | Discharge status: Home / Self Care | Payer: PRIVATE HEALTH INSURANCE | Source: Ambulatory Visit | Attending: Family | Admitting: Family

## 2011-11-12 ENCOUNTER — Other Ambulatory Visit: Payer: Self-pay | Admitting: Internal Medicine

## 2011-11-12 ENCOUNTER — Encounter (HOSPITAL_COMMUNITY): Payer: Self-pay

## 2011-11-12 DIAGNOSIS — M545 Low back pain: Secondary | ICD-10-CM

## 2011-11-12 DIAGNOSIS — M898X9 Other specified disorders of bone, unspecified site: Secondary | ICD-10-CM

## 2011-11-12 DIAGNOSIS — Z853 Personal history of malignant neoplasm of breast: Secondary | ICD-10-CM | POA: Insufficient documentation

## 2011-11-12 DIAGNOSIS — M79606 Pain in leg, unspecified: Secondary | ICD-10-CM

## 2011-11-12 DIAGNOSIS — M79609 Pain in unspecified limb: Secondary | ICD-10-CM | POA: Insufficient documentation

## 2011-11-12 DIAGNOSIS — M549 Dorsalgia, unspecified: Secondary | ICD-10-CM | POA: Insufficient documentation

## 2011-11-12 MED ORDER — TECHNETIUM TC 99M MEDRONATE IV KIT
25.0000 | PACK | Freq: Once | INTRAVENOUS | Status: AC | PRN
  Administered 2011-11-12: 25 via INTRAVENOUS

## 2011-11-30 ENCOUNTER — Ambulatory Visit: Payer: PRIVATE HEALTH INSURANCE | Admitting: Family

## 2011-12-03 ENCOUNTER — Encounter: Payer: Self-pay | Admitting: Family

## 2011-12-03 ENCOUNTER — Ambulatory Visit (INDEPENDENT_AMBULATORY_CARE_PROVIDER_SITE_OTHER): Payer: PRIVATE HEALTH INSURANCE | Admitting: Family

## 2011-12-03 ENCOUNTER — Other Ambulatory Visit: Payer: Self-pay | Admitting: Family

## 2011-12-03 VITALS — BP 124/62 | Temp 98.4°F | Wt 169.0 lb

## 2011-12-03 DIAGNOSIS — F3289 Other specified depressive episodes: Secondary | ICD-10-CM

## 2011-12-03 DIAGNOSIS — F419 Anxiety disorder, unspecified: Secondary | ICD-10-CM

## 2011-12-03 DIAGNOSIS — F411 Generalized anxiety disorder: Secondary | ICD-10-CM

## 2011-12-03 DIAGNOSIS — R252 Cramp and spasm: Secondary | ICD-10-CM

## 2011-12-03 DIAGNOSIS — M1990 Unspecified osteoarthritis, unspecified site: Secondary | ICD-10-CM

## 2011-12-03 DIAGNOSIS — F329 Major depressive disorder, single episode, unspecified: Secondary | ICD-10-CM

## 2011-12-03 LAB — BASIC METABOLIC PANEL
BUN: 13 mg/dL (ref 6–23)
Chloride: 105 mEq/L (ref 96–112)
Creatinine, Ser: 0.9 mg/dL (ref 0.4–1.2)
GFR: 67.18 mL/min (ref >60.00)
Potassium: 3 mEq/L — ABNORMAL LOW (ref 3.5–5.1)

## 2011-12-03 MED ORDER — POTASSIUM CHLORIDE CRYS ER 20 MEQ PO TBCR: 20.0000 meq | EXTENDED_RELEASE_TABLET | Freq: Every day | ORAL | Status: DC

## 2011-12-03 MED ORDER — HYDROCODONE-ACETAMINOPHEN 5-500 MG PO TABS: 1.0000 | ORAL_TABLET | Freq: Three times a day (TID) | ORAL | Status: DC | PRN

## 2011-12-03 MED ORDER — MELOXICAM 7.5 MG PO TABS: 7.5000 mg | ORAL_TABLET | Freq: Every day | ORAL | Status: DC

## 2011-12-03 NOTE — Progress Notes (Signed)
Subjective:    Patient ID: Gina Weber, female    DOB: 1946-04-07, 66 y.o.   MRN: [id number]  HPI 66 year old patient is in today to be recheck on anxiety. She was started on Paxil is currently on 40 mg a day. Patient does not believe her medications help in her symptoms. She continues to feel anxious, depressed and frustrated. She denies any frontal helplessness, hopelessness, thoughts of death or dying. Patient also reports joint pain particularly to her right thumb. She has a history of osteoarthritis. Is not currently taken any medications for it  Patient's husband is chronically ill and her son is on drugs.  Review of Systems  Constitutional: Negative.   Respiratory: Negative.   Cardiovascular: Negative.   Gastrointestinal: Negative.   Genitourinary: Negative.   Musculoskeletal: Positive for arthralgias.  Skin: Negative.   Neurological: Negative.   Hematological: Negative.   Psychiatric/Behavioral: Positive for sleep disturbance and agitation. The patient is nervous/anxious.    Past Medical History  Diagnosis Date  . Fibromyalgia   . Arthritis   . Allergy   . Hyperlipidemia   . Hypertension   . GERD (gastroesophageal reflux disease)   . Depression   . COPD (chronic obstructive pulmonary disease)   . Anxiety   . Diverticulosis   . Breast cancer 1987 & 2006    radiation therapy    History   Social History  . Marital Status: Married    Spouse Name: N/A    Number of Children: N/A  . Years of Education: N/A   Occupational History  . Not on file.   Social History Main Topics  . Smoking status: Never Smoker   . Smokeless tobacco: Not on file  . Alcohol Use: No  . Drug Use: No  . Sexually Active:    Other Topics Concern  . Not on file   Social History Narrative  . No narrative on file    Past Surgical History  Procedure Date  . Abdominal hysterectomy   . Mastectomy 1987    bilateral f/u reconstruction  . Cataract extraction     left eye  .  Appendectomy     Family History  Problem Relation Age of Onset  . Cancer Mother 62    esophageal   . Aneurysm Father     AAA  . Heart disease Father   . Depression Son     bipolar  . Colon cancer Neg Hx     Allergies  Allergen Reactions  . Pravastatin Sodium     REACTION: liver enzyme abnormality---per pt's report  . Statins     REACTION: leg pain, elevates liver enzymes  . Sulfamethoxazole     REACTION: hives    Current Outpatient Prescriptions on File Prior to Visit  Medication Sig Dispense Refill  . albuterol (PROAIR HFA) 108 (90 BASE) MCG/ACT inhaler Inhale 2 puffs into the lungs every 6 (six) hours as needed for wheezing.  18 g  5  . ALPRAZolam (XANAX) 0.5 MG tablet Take 1 tablet (0.5 mg total) by mouth at bedtime as needed. For sleep  90 tablet  0  . amitriptyline (ELAVIL) 10 MG tablet TAKE 2 TABLETS BY MOUTH AT BEDTIME  180 tablet  1  . amLODipine (NORVASC) 10 MG tablet Take 10 mg by mouth daily.       . Fluticasone-Salmeterol (ADVAIR DISKUS) 100-50 MCG/DOSE AEPB Inhale 1 puff into the lungs every 12 (twelve) hours.  180 each  3  . hydrochlorothiazide (,MICROZIDE/HYDRODIURIL,) 12.5 MG capsule Take  12.5 mg by mouth daily as needed. For fluid retention      . losartan (COZAAR) 100 MG tablet       . montelukast (SINGULAIR) 10 MG tablet TAKE 1 TABLET AT BEDTIME  90 tablet  0  . NEXIUM 40 MG capsule TAKE ONE CAPSULE DAILY  90 capsule  29  . PARoxetine (PAXIL) 40 MG tablet Take 1 tablet (40 mg total) by mouth at bedtime.  30 tablet  4  . DISCONTD: zolpidem (AMBIEN) 5 MG tablet Take 1 tablet (5 mg total) by mouth at bedtime as needed for sleep.  90 tablet  0    BP 124/62  Temp(Src) 98.4 F (36.9 C) (Oral)  Wt 169 lb (76.658 kg)chart    Objective:   Physical Exam  Constitutional: She is oriented to person, place, and time. She appears well-developed and well-nourished.  Neck: Normal range of motion. Neck supple.  Cardiovascular: Normal rate, regular rhythm and normal  heart sounds.   Pulmonary/Chest: Effort normal and breath sounds normal.  Abdominal: Soft. Bowel sounds are normal.  Musculoskeletal: Normal range of motion.  Neurological: She is alert and oriented to person, place, and time.  Skin: Skin is warm and dry.  Psychiatric: She has a normal mood and affect.          Assessment & Plan:  Assessment: Anxiety, depression, myalgias, osteoarthritis  Plan: DC Paxil. Start Cymbalta 30 mg once daily. After 2 weeks, will increase to 60 mg once daily. Mobic 7.5 mg once daily. Refill prescription for Vicodin as needed for pain. The patient back for recheck of anxiety and depression in 2-3 weeks and sooner when necessary.

## 2011-12-03 NOTE — Patient Instructions (Signed)
Osteoarthritis Osteoarthritis is the most common form of arthritis. It is redness, soreness, and swelling (inflammation) affecting the cartilage. Cartilage acts as a cushion, covering the ends of bones where they meet to form a joint. CAUSES  Over time, the cartilage begins to wear away. This causes bone to rub on bone. This produces pain and stiffness in the affected joints. Factors that contribute to this problem are:  Excessive body weight.   Age.   Overuse of joints.  SYMPTOMS   People with osteoarthritis usually experience joint pain, swelling, or stiffness.   Over time, the joint may lose its normal shape.   Small deposits of bone (osteophytes) may grow on the edges of the joint.   Bits of bone or cartilage can break off and float inside the joint space. This may cause more pain and damage.   Osteoarthritis can lead to depression, anxiety, feelings of helplessness, and limitations on daily activities.  The most commonly affected joints are in the:  Ends of the fingers.   Thumbs.   Neck.   Lower back.   Knees.   Hips.  DIAGNOSIS  Diagnosis is mostly based on your symptoms and exam. Tests may be helpful, including:  X-rays of the affected joint.   A computerized magnetic scan (MRI).   Blood tests to rule out other types of arthritis.   Joint fluid tests. This involves using a needle to draw fluid from the joint and examining the fluid under a microscope.  TREATMENT  Goals of treatment are to control pain, improve joint function, maintain a normal body weight, and maintain a healthy lifestyle. Treatment approaches may include:  A prescribed exercise program with rest and joint relief.   Weight control with nutritional education.   Pain relief techniques such as:   Properly applied heat and cold.   Electric pulses delivered to nerve endings under the skin (transcutaneous electrical nerve stimulation, TENS).   Massage.   Certain supplements. Ask your  caregiver before using any supplements, especially in combination with prescribed drugs.   Medicines to control pain, such as:   Acetaminophen.   Nonsteroidal anti-inflammatory drugs (NSAIDs), such as naproxen.   Narcotic or central-acting agents, such as tramadol. This drug carries a risk of addiction and is generally prescribed for short-term use.   Corticosteroids. These can be given orally or as injection. This is a short-term treatment, not recommended for routine use.   Surgery to reposition the bones and relieve pain (osteotomy) or to remove loose pieces of bone and cartilage. Joint replacement may be needed in advanced states of osteoarthritis.  HOME CARE INSTRUCTIONS  Your caregiver can recommend specific types of exercise. These may include:  Strengthening exercises. These are done to strengthen the muscles that support joints affected by arthritis. They can be performed with weights or with exercise bands to add resistance.   Aerobic activities. These are exercises, such as brisk walking or low-impact aerobics, that get your heart pumping. They can help keep your lungs and circulatory system in shape.   Range-of-motion activities. These keep your joints limber.   Balance and agility exercises. These help you maintain daily living skills.  Learning about your condition and being actively involved in your care will help improve the course of your osteoarthritis. SEEK MEDICAL CARE IF:   You feel hot or your skin turns red.   You develop a rash in addition to your joint pain.   You have an oral temperature above 102 F (38.9 C).  FOR   MORE INFORMATION  National Institute of Arthritis and Musculoskeletal and Skin Diseases: www.niams.nih.gov National Institute on Aging: www.nia.nih.gov American College of Rheumatology: www.rheumatology.org Document Released: 06/14/2005 Document Revised: 06/03/2011 Document Reviewed: 09/25/2009 [employer] Patient Information 2012 [employer]. 

## 2011-12-07 ENCOUNTER — Telehealth: Payer: Self-pay | Admitting: Family

## 2011-12-07 NOTE — Telephone Encounter (Signed)
See lab note.  

## 2011-12-07 NOTE — Telephone Encounter (Signed)
Pt is return Gina Weber concerning blood work results

## 2011-12-24 ENCOUNTER — Ambulatory Visit: Payer: PRIVATE HEALTH INSURANCE | Admitting: Family

## 2011-12-28 ENCOUNTER — Ambulatory Visit (INDEPENDENT_AMBULATORY_CARE_PROVIDER_SITE_OTHER): Payer: PRIVATE HEALTH INSURANCE | Admitting: Family

## 2011-12-28 ENCOUNTER — Encounter: Payer: Self-pay | Admitting: Family

## 2011-12-28 VITALS — BP 140/80 | Temp 98.7°F | Wt 165.0 lb

## 2011-12-28 DIAGNOSIS — M1990 Unspecified osteoarthritis, unspecified site: Secondary | ICD-10-CM

## 2011-12-28 DIAGNOSIS — F419 Anxiety disorder, unspecified: Secondary | ICD-10-CM

## 2011-12-28 DIAGNOSIS — F329 Major depressive disorder, single episode, unspecified: Secondary | ICD-10-CM

## 2011-12-28 DIAGNOSIS — F411 Generalized anxiety disorder: Secondary | ICD-10-CM

## 2011-12-28 MED ORDER — HYDROCODONE-ACETAMINOPHEN 5-500 MG PO TABS: 1.0000 | ORAL_TABLET | Freq: Three times a day (TID) | ORAL | Status: AC | PRN

## 2011-12-28 MED ORDER — DULOXETINE HCL 30 MG PO CPEP: 30.0000 mg | ORAL_CAPSULE | Freq: Every day | ORAL | Status: DC

## 2011-12-28 NOTE — Patient Instructions (Addendum)
Osteoarthritis Osteoarthritis is the most common form of arthritis. It is redness, soreness, and swelling (inflammation) affecting the cartilage. Cartilage acts as a cushion, covering the ends of bones where they meet to form a joint. CAUSES  Over time, the cartilage begins to wear away. This causes bone to rub on bone. This produces pain and stiffness in the affected joints. Factors that contribute to this problem are:  Excessive body weight.   Age.   Overuse of joints.  SYMPTOMS   People with osteoarthritis usually experience joint pain, swelling, or stiffness.   Over time, the joint may lose its normal shape.   Small deposits of bone (osteophytes) may grow on the edges of the joint.   Bits of bone or cartilage can break off and float inside the joint space. This may cause more pain and damage.   Osteoarthritis can lead to depression, anxiety, feelings of helplessness, and limitations on daily activities.  The most commonly affected joints are in the:  Ends of the fingers.   Thumbs.   Neck.   Lower back.   Knees.   Hips.  DIAGNOSIS  Diagnosis is mostly based on your symptoms and exam. Tests may be helpful, including:  X-rays of the affected joint.   A computerized magnetic scan (MRI).   Blood tests to rule out other types of arthritis.   Joint fluid tests. This involves using a needle to draw fluid from the joint and examining the fluid under a microscope.  TREATMENT  Goals of treatment are to control pain, improve joint function, maintain a normal body weight, and maintain a healthy lifestyle. Treatment approaches may include:  A prescribed exercise program with rest and joint relief.   Weight control with nutritional education.   Pain relief techniques such as:   Properly applied heat and cold.   Electric pulses delivered to nerve endings under the skin (transcutaneous electrical nerve stimulation, TENS).   Massage.   Certain supplements. Ask your  caregiver before using any supplements, especially in combination with prescribed drugs.   Medicines to control pain, such as:   Acetaminophen.   Nonsteroidal anti-inflammatory drugs (NSAIDs), such as naproxen.   Narcotic or central-acting agents, such as tramadol. This drug carries a risk of addiction and is generally prescribed for short-term use.   Corticosteroids. These can be given orally or as injection. This is a short-term treatment, not recommended for routine use.   Surgery to reposition the bones and relieve pain (osteotomy) or to remove loose pieces of bone and cartilage. Joint replacement may be needed in advanced states of osteoarthritis.  HOME CARE INSTRUCTIONS  Your caregiver can recommend specific types of exercise. These may include:  Strengthening exercises. These are done to strengthen the muscles that support joints affected by arthritis. They can be performed with weights or with exercise bands to add resistance.   Aerobic activities. These are exercises, such as brisk walking or low-impact aerobics, that get your heart pumping. They can help keep your lungs and circulatory system in shape.   Range-of-motion activities. These keep your joints limber.   Balance and agility exercises. These help you maintain daily living skills.  Learning about your condition and being actively involved in your care will help improve the course of your osteoarthritis. SEEK MEDICAL CARE IF:   You feel hot or your skin turns red.   You develop a rash in addition to your joint pain.   You have an oral temperature above 102 F (38.9 C).  FOR   MORE INFORMATION  [employer] of Arthritis and Musculoskeletal and Skin Diseases: www.niams.http://www.myers.net/ [employer] on Aging: https://walker.com/ American College of Rheumatology: www.rheumatology.org Document Released: 06/14/2005 Document Revised: 06/03/2011 Document Reviewed: 09/25/2009 [facility] Patient Information 2012 [place],  [place].  Depression, Adolescent and Adult Depression is a true and treatable medical condition. In general there are two kinds of depression:  Depression we all experience in some form. For example depression from the death of a loved one, financial distress or natural disasters will trigger or increase depression.   Clinical depression, on the other hand, appears without an apparent cause or reason. This depression is a disease. Depression may be caused by chemical imbalance in the body and brain or may come as a response to a physical illness. Alcohol and other drugs can cause depression.  DIAGNOSIS  The diagnosis of depression is usually based upon symptoms and medical history. TREATMENT  Treatments for depression fall into three categories. These are:  Drug therapy. There are many medicines that treat depression. Responses may vary and sometimes trial and error is necessary to determine the best medicines and dosage for a particular patient.   Psychotherapy, also called talking treatments, helps people resolve their problems by looking at them from a different point of view and by giving people insight into their own personal makeup. Traditional psychotherapy looks at a childhood source of a problem. Other psychotherapy will look at current conflicts and move toward solving those. If the cause of depression is drug use, counseling is available to help abstain. In time the depression will usually improve. If there were underlying causes for the chemical use, they can be addressed.   ECT (electroconvulsive therapy) or shock treatment is not as commonly used today. It is a very effective treatment for severe suicidal depression. During ECT electrical impulses are applied to the head. These impulses cause a generalized seizure. It can be effective but causes a loss of memory for recent events. Sometimes this loss of memory may include the last several months.  Treat all depression or suicide threats  as serious. Obtain professional help. Do not wait to see if serious depression will get better over time without help. Seek help for yourself or those around you. In the [place] the number to the National Suicide Help Lines With 24 Hour Help Are: 1-800-SUICIDE 602-299-3576 Document Released: 06/11/2000 Document Revised: 06/03/2011 Document Reviewed: 01/31/2008 [facility] Patient Information 2012 [place], [place].

## 2011-12-28 NOTE — Progress Notes (Signed)
Subjective:    Patient ID: Gina Weber, female    DOB: 11-18-1945, 66 y.o.   MRN: [id number]  HPI  66 year old Caucasian female presents today for follow up of her depression and anxiety after switching from Paxil to Cymbalta. She noticed an improvement to her mood a few days after starting the Cymbalta, but states her mood worsened again and she had more attacks of frustration when she increased to the 60 mg dose. Reports she stopped taking the Cymbalta and switched back to her Paxil. Denies any thoughts of hopelessness and death or dying.  Pain to her right thumb and left knee still present, rates pain to her right thumb 8/10 at its worst and the knee pain 7/10. Reports she has not taken the Mobic as prescribed because she thought there was a drug interaction with the Cymbalta. Denies numbness or tingling.   Review of Systems  Constitutional: Negative.   HENT: Negative.   Eyes: Negative.   Respiratory: Negative.   Cardiovascular: Negative for chest pain and palpitations.  Gastrointestinal: Negative.   Musculoskeletal: Positive for joint swelling (Left knee) and arthralgias (Right thumb and left knee).  Skin: Negative.   Neurological: Negative for weakness and numbness.  Psychiatric/Behavioral: Positive for agitation. The patient is nervous/anxious.    Past Medical History  Diagnosis Date  . Fibromyalgia   . Arthritis   . Allergy   . Hyperlipidemia   . Hypertension   . GERD (gastroesophageal reflux disease)   . Depression   . COPD (chronic obstructive pulmonary disease)   . Anxiety   . Diverticulosis   . Breast cancer 1987 & 2006    radiation therapy    History   Social History  . Marital Status: Married    Spouse Name: N/A    Number of Children: N/A  . Years of Education: N/A   Occupational History  . Not on file.   Social History Main Topics  . Smoking status: Never Smoker   . Smokeless tobacco: Not on file  . Alcohol Use: No  . Drug Use: No  . Sexually  Active:    Other Topics Concern  . Not on file   Social History Narrative  . No narrative on file    Past Surgical History  Procedure Date  . Abdominal hysterectomy   . Mastectomy 1987    bilateral f/u reconstruction  . Cataract extraction     left eye  . Appendectomy     Family History  Problem Relation Age of Onset  . Cancer Mother 52    esophageal   . Aneurysm Father     AAA  . Heart disease Father   . Depression Son     bipolar  . Colon cancer Neg Hx     Allergies  Allergen Reactions  . Pravastatin Sodium     REACTION: liver enzyme abnormality---per pt's report  . Statins     REACTION: leg pain, elevates liver enzymes  . Sulfamethoxazole     REACTION: hives    Current Outpatient Prescriptions on File Prior to Visit  Medication Sig Dispense Refill  . albuterol (PROAIR HFA) 108 (90 BASE) MCG/ACT inhaler Inhale 2 puffs into the lungs every 6 (six) hours as needed for wheezing.  18 g  5  . ALPRAZolam (XANAX) 0.5 MG tablet Take 1 tablet (0.5 mg total) by mouth at bedtime as needed. For sleep  90 tablet  0  . amitriptyline (ELAVIL) 10 MG tablet TAKE 2 TABLETS BY MOUTH  AT BEDTIME  180 tablet  1  . amLODipine (NORVASC) 10 MG tablet Take 10 mg by mouth daily.       . Fluticasone-Salmeterol (ADVAIR DISKUS) 100-50 MCG/DOSE AEPB Inhale 1 puff into the lungs every 12 (twelve) hours.  180 each  3  . hydrochlorothiazide (,MICROZIDE/HYDRODIURIL,) 12.5 MG capsule Take 12.5 mg by mouth daily as needed. For fluid retention      . losartan (COZAAR) 100 MG tablet       . meloxicam (MOBIC) 7.5 MG tablet Take 1 tablet (7.5 mg total) by mouth daily.  30 tablet  3  . montelukast (SINGULAIR) 10 MG tablet TAKE 1 TABLET AT BEDTIME  90 tablet  0  . NEXIUM 40 MG capsule TAKE ONE CAPSULE DAILY  90 capsule  29  . PARoxetine (PAXIL) 40 MG tablet Take 1 tablet (40 mg total) by mouth at bedtime.  30 tablet  4  . potassium chloride SA (K-DUR,KLOR-CON) 20 MEQ tablet Take 1 tablet (20 mEq total)  by mouth daily.  30 tablet  0  . zolpidem (AMBIEN) 5 MG tablet Take 5 mg by mouth at bedtime as needed.      . DULoxetine (CYMBALTA) 30 MG capsule Take 1 capsule (30 mg total) by mouth daily.  30 capsule  0    BP 140/80  Temp 98.7 F (37.1 C) (Oral)  Wt 165 lb (74.844 kg)chart     Objective:   Physical Exam  Constitutional: She is oriented to person, place, and time. She appears well-developed and well-nourished.  HENT:  Head: Normocephalic.  Cardiovascular: Normal rate, regular rhythm and normal heart sounds.  Exam reveals no gallop and no friction rub.   No murmur heard. Pulmonary/Chest: Effort normal and breath sounds normal. No respiratory distress. She has no wheezes.  Musculoskeletal:       Left knee: She exhibits decreased range of motion and swelling.       Right hand: She exhibits bony tenderness (To the right thumb). normal sensation noted. Decreased sensation is not present in the ulnar distribution, is not present in the medial distribution and is not present in the radial distribution.       Crepitus noted in the left knee  Neurological: She is alert and oriented to person, place, and time.  Skin: Skin is warm and dry.  Psychiatric: She has a normal mood and affect. Her behavior is normal.          Assessment & Plan:  Assessment: Depression, Anxiety, Osteoarthritis  Plan: Decrease dose of Cymbalta back to 30 mg daily. Provided phone number for [facility] as she is interested in pursuing therapy at this time. Educated patient on role of antiinflammatory in treatment of her thumb and knee pain. She will take Mobic 7.5 mg daily and Vicodin as needed for breakthrough pain. Instructed to call the office if symptoms worsen or persist. Follow up in one month, or before as needed.

## 2012-01-12 ENCOUNTER — Encounter (HOSPITAL_COMMUNITY): Payer: Self-pay | Admitting: Psychiatry

## 2012-01-12 ENCOUNTER — Ambulatory Visit (INDEPENDENT_AMBULATORY_CARE_PROVIDER_SITE_OTHER): Payer: PRIVATE HEALTH INSURANCE | Admitting: Psychiatry

## 2012-01-12 DIAGNOSIS — F329 Major depressive disorder, single episode, unspecified: Secondary | ICD-10-CM
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
## 2012-01-19 ENCOUNTER — Other Ambulatory Visit: Payer: Self-pay | Admitting: Internal Medicine

## 2012-01-26 ENCOUNTER — Ambulatory Visit (INDEPENDENT_AMBULATORY_CARE_PROVIDER_SITE_OTHER): Payer: PRIVATE HEALTH INSURANCE | Admitting: Psychiatry

## 2012-01-26 DIAGNOSIS — F329 Major depressive disorder, single episode, unspecified: Secondary | ICD-10-CM

## 2012-01-26 NOTE — Patient Instructions (Signed)
Discussed orally 
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
## 2012-01-27 ENCOUNTER — Other Ambulatory Visit: Payer: Self-pay | Admitting: Internal Medicine

## 2012-01-28 ENCOUNTER — Encounter: Payer: Self-pay | Admitting: Family

## 2012-01-28 ENCOUNTER — Ambulatory Visit (INDEPENDENT_AMBULATORY_CARE_PROVIDER_SITE_OTHER): Payer: PRIVATE HEALTH INSURANCE | Admitting: Family

## 2012-01-28 VITALS — BP 116/64 | HR 97 | Temp 97.9°F | Resp 18 | Ht 65.0 in | Wt 163.0 lb

## 2012-01-28 DIAGNOSIS — R7989 Other specified abnormal findings of blood chemistry: Secondary | ICD-10-CM

## 2012-01-28 DIAGNOSIS — E7800 Pure hypercholesterolemia, unspecified: Secondary | ICD-10-CM

## 2012-01-28 DIAGNOSIS — E876 Hypokalemia: Secondary | ICD-10-CM

## 2012-01-28 DIAGNOSIS — G4700 Insomnia, unspecified: Secondary | ICD-10-CM

## 2012-01-28 DIAGNOSIS — R945 Abnormal results of liver function studies: Secondary | ICD-10-CM

## 2012-01-28 LAB — BASIC METABOLIC PANEL
CO2: 25 mEq/L (ref 19–32)
Calcium: 9.3 mg/dL (ref 8.4–10.5)
GFR: 77.04 mL/min (ref >60.00)
Glucose, Bld: 82 mg/dL (ref 70–99)
Potassium: 3.6 mEq/L (ref 3.5–5.1)
Sodium: 141 mEq/L (ref 135–145)

## 2012-01-28 LAB — HEPATIC FUNCTION PANEL
AST: 20 U/L (ref 0–37)
Alkaline Phosphatase: 98 U/L (ref 39–117)
Bilirubin, Direct: 0.1 mg/dL (ref 0.0–0.3)

## 2012-01-28 LAB — LIPID PANEL
HDL: 54.3 mg/dL (ref >39.00)
Total CHOL/HDL Ratio: 5
VLDL: 40 mg/dL (ref 0.0–40.0)

## 2012-01-28 LAB — LDL CHOLESTEROL, DIRECT: Direct LDL: 185.7 mg/dL

## 2012-01-28 MED ORDER — ZOLPIDEM TARTRATE 5 MG PO TABS: 5.0000 mg | ORAL_TABLET | Freq: Every evening | ORAL | Status: DC | PRN

## 2012-01-28 NOTE — Progress Notes (Signed)
Subjective:    Patient ID: Gina Weber, female    DOB: 01-09-1946, 66 y.o.   MRN: [id number]  HPI  66 year old white female, smoker is in for recheck of hypokalemia. She does describe potassium 20 mEq daily, but has not been taking the tablets because she's been unable to swallow them. Patient reports having a powdered potassium at home she was taken periodically. She continues to have joint pain related to osteoarthritis. She's taken Mobic 7.5 mg daily. Medications not been effective. However, when she takes one half to one Vicodin daily her pain is well controlled. She's currently on Cymbalta and doing well. Denies any feelings of helplessness, hopelessness, thoughts of death or dying.  Review of Systems  Constitutional: Negative.   HENT: Negative.   Cardiovascular: Negative.   Gastrointestinal: Negative.   Musculoskeletal: Positive for arthralgias.  Skin: Negative.   Neurological: Negative.   Hematological: Negative.   Psychiatric/Behavioral: Negative.    Past Medical History  Diagnosis Date  . Fibromyalgia   . Arthritis   . Allergy   . Hyperlipidemia   . Hypertension   . GERD (gastroesophageal reflux disease)   . Depression   . COPD (chronic obstructive pulmonary disease)   . Anxiety   . Diverticulosis   . Breast cancer 1987 & 2006    radiation therapy    History   Social History  . Marital Status: Married    Spouse Name: N/A    Number of Children: N/A  . Years of Education: N/A   Occupational History  . Not on file.   Social History Main Topics  . Smoking status: Never Smoker   . Smokeless tobacco: Never Used  . Alcohol Use: No  . Drug Use: No  . Sexually Active:    Other Topics Concern  . Not on file   Social History Narrative  . No narrative on file    Past Surgical History  Procedure Date  . Abdominal hysterectomy   . Mastectomy 1987    bilateral f/u reconstruction  . Cataract extraction     left eye  . Appendectomy     Family History    Problem Relation Age of Onset  . Cancer Mother 50    esophageal   . Aneurysm Father     AAA  . Heart disease Father   . Depression Son     bipolar  . Colon cancer Neg Hx     Allergies  Allergen Reactions  . Pravastatin Sodium     REACTION: liver enzyme abnormality---per pt's report  . Statins     REACTION: leg pain, elevates liver enzymes  . Sulfamethoxazole     REACTION: hives    Current Outpatient Prescriptions on File Prior to Visit  Medication Sig Dispense Refill  . albuterol (PROAIR HFA) 108 (90 BASE) MCG/ACT inhaler Inhale 2 puffs into the lungs every 6 (six) hours as needed for wheezing.  18 g  5  . ALPRAZolam (XANAX) 0.5 MG tablet Take 1 tablet (0.5 mg total) by mouth at bedtime as needed. For sleep  90 tablet  0  . amitriptyline (ELAVIL) 10 MG tablet TAKE 2 TABLETS BY MOUTH AT BEDTIME  180 tablet  1  . amLODipine (NORVASC) 10 MG tablet Take 10 mg by mouth daily.       . DULoxetine (CYMBALTA) 30 MG capsule Take 1 capsule (30 mg total) by mouth daily.  30 capsule  0  . Fluticasone-Salmeterol (ADVAIR DISKUS) 100-50 MCG/DOSE AEPB Inhale 1 puff into the lungs  every 12 (twelve) hours.  180 each  3  . hydrochlorothiazide (,MICROZIDE/HYDRODIURIL,) 12.5 MG capsule Take 12.5 mg by mouth daily as needed. For fluid retention      . losartan (COZAAR) 100 MG tablet       . meloxicam (MOBIC) 7.5 MG tablet Take 1 tablet (7.5 mg total) by mouth daily.  30 tablet  3  . montelukast (SINGULAIR) 10 MG tablet TAKE 1 TABLET AT BEDTIME  90 tablet  0  . NEXIUM 40 MG capsule TAKE ONE CAPSULE DAILY  90 capsule  29  . DISCONTD: zolpidem (AMBIEN) 5 MG tablet Take 5 mg by mouth at bedtime as needed.      Marland Kitchen PARoxetine (PAXIL) 40 MG tablet Take 1 tablet (40 mg total) by mouth at bedtime.  30 tablet  4  . potassium chloride SA (K-DUR,KLOR-CON) 20 MEQ tablet Take 1 tablet (20 mEq total) by mouth daily.  30 tablet  0    BP 116/64  Pulse 97  Temp 97.9 F (36.6 C) (Oral)  Resp 18  Ht 5\' 5"  (1.651  m)  Wt 163 lb (73.936 kg)  BMI 27.12 kg/m2  SpO2 95%chart    Objective:   Physical Exam  Constitutional: She is oriented to person, place, and time. She appears well-developed and well-nourished.  Neck: Neck supple. Thyromegaly present.  Cardiovascular: Normal rate, regular rhythm and normal heart sounds.   Pulmonary/Chest: Effort normal and breath sounds normal.  Abdominal: Soft. Bowel sounds are normal.  Neurological: She is alert and oriented to person, place, and time.  Skin: Skin is warm and dry.  Psychiatric: She has a normal mood and affect.          Assessment & Plan:  Assessment: Hypokalemia, anxiety depression, osteoarthritis  Plan: Increase Mobic to 15 mg once daily. Patient will a [facility] her symptoms get better. Lab sent. Will followup with patient in 3-4 months and sooner when necessary.

## 2012-02-01 ENCOUNTER — Other Ambulatory Visit: Payer: Self-pay

## 2012-02-01 ENCOUNTER — Ambulatory Visit: Payer: PRIVATE HEALTH INSURANCE | Admitting: Family

## 2012-02-01 MED ORDER — DULOXETINE HCL 30 MG PO CPEP: 30.0000 mg | ORAL_CAPSULE | Freq: Every day | ORAL | Status: DC

## 2012-02-09 ENCOUNTER — Other Ambulatory Visit: Payer: Self-pay | Admitting: Family Medicine

## 2012-02-09 NOTE — Telephone Encounter (Signed)
Patient is requesting refills.  Last seen 02/01/12 for 1 month follow up.  Please advise.

## 2012-02-10 MED ORDER — DULOXETINE HCL 30 MG PO CPEP: 30.0000 mg | ORAL_CAPSULE | Freq: Every day | ORAL | Status: DC

## 2012-02-10 NOTE — Telephone Encounter (Signed)
Sent to [facility]

## 2012-02-11 ENCOUNTER — Ambulatory Visit (HOSPITAL_COMMUNITY): Payer: Self-pay | Admitting: Psychiatry

## 2012-02-11 ENCOUNTER — Other Ambulatory Visit: Payer: Self-pay | Admitting: Internal Medicine

## 2012-02-14 ENCOUNTER — Ambulatory Visit (HOSPITAL_COMMUNITY): Payer: Self-pay | Admitting: Psychiatry

## 2012-02-15 ENCOUNTER — Other Ambulatory Visit: Payer: Self-pay | Admitting: *Deleted

## 2012-02-15 MED ORDER — ALBUTEROL SULFATE HFA 108 (90 BASE) MCG/ACT IN AERS: 2.0000 | INHALATION_SPRAY | Freq: Four times a day (QID) | RESPIRATORY_TRACT | Status: DC | PRN

## 2012-02-17 ENCOUNTER — Telehealth: Payer: Self-pay | Admitting: Family

## 2012-02-17 NOTE — Telephone Encounter (Signed)
Patient called stating that she would like a call back with lab results and a copy mailed to her home. Please assist.

## 2012-02-21 NOTE — Telephone Encounter (Signed)
Please mail labs

## 2012-02-21 NOTE — Telephone Encounter (Signed)
Labs mailed to pt

## 2012-03-02 ENCOUNTER — Other Ambulatory Visit: Payer: Self-pay

## 2012-03-02 MED ORDER — ALPRAZOLAM 0.5 MG PO TABS: 0.5000 mg | ORAL_TABLET | Freq: Every evening | ORAL | Status: DC | PRN

## 2012-03-03 ENCOUNTER — Other Ambulatory Visit: Payer: Self-pay | Admitting: Family

## 2012-03-09 ENCOUNTER — Other Ambulatory Visit: Payer: Self-pay | Admitting: Internal Medicine

## 2012-04-01 DIAGNOSIS — E039 Hypothyroidism, unspecified: Secondary | ICD-10-CM | POA: Insufficient documentation

## 2012-04-06 ENCOUNTER — Ambulatory Visit (HOSPITAL_COMMUNITY): Payer: Self-pay | Admitting: Psychiatry

## 2012-04-07 ENCOUNTER — Ambulatory Visit (INDEPENDENT_AMBULATORY_CARE_PROVIDER_SITE_OTHER): Payer: Medicare Other | Admitting: Family

## 2012-04-07 ENCOUNTER — Encounter: Payer: Self-pay | Admitting: Family

## 2012-04-07 VITALS — BP 120/78 | HR 88 | Wt 160.0 lb

## 2012-04-07 DIAGNOSIS — E7800 Pure hypercholesterolemia, unspecified: Secondary | ICD-10-CM

## 2012-04-07 DIAGNOSIS — Z853 Personal history of malignant neoplasm of breast: Secondary | ICD-10-CM

## 2012-04-07 DIAGNOSIS — M2550 Pain in unspecified joint: Secondary | ICD-10-CM

## 2012-04-07 DIAGNOSIS — E876 Hypokalemia: Secondary | ICD-10-CM

## 2012-04-07 DIAGNOSIS — I10 Essential (primary) hypertension: Secondary | ICD-10-CM

## 2012-04-07 DIAGNOSIS — F411 Generalized anxiety disorder: Secondary | ICD-10-CM

## 2012-04-07 DIAGNOSIS — F419 Anxiety disorder, unspecified: Secondary | ICD-10-CM

## 2012-04-07 DIAGNOSIS — M549 Dorsalgia, unspecified: Secondary | ICD-10-CM

## 2012-04-07 LAB — CBC WITH DIFFERENTIAL/PLATELET
Basophils Relative: 0.4 % (ref 0.0–3.0)
Eosinophils Absolute: 0.1 10*3/uL (ref 0.0–0.7)
Eosinophils Relative: 2 % (ref 0.0–5.0)
Hemoglobin: 14.5 g/dL (ref 12.0–15.0)
Lymphocytes Relative: 18.7 % (ref 12.0–46.0)
MCHC: 33.9 g/dL (ref 30.0–36.0)
Monocytes Relative: 9.3 % (ref 3.0–12.0)
Neutrophils Relative %: 69.6 % (ref 43.0–77.0)
RBC: 4.82 Mil/uL (ref 3.87–5.11)
WBC: 5.8 10*3/uL (ref 4.5–10.5)

## 2012-04-07 LAB — BASIC METABOLIC PANEL
Calcium: 9 mg/dL (ref 8.4–10.5)
Creatinine, Ser: 0.8 mg/dL (ref 0.4–1.2)
GFR: 78.14 mL/min (ref >60.00)
Sodium: 140 mEq/L (ref 135–145)

## 2012-04-07 LAB — LIPID PANEL
Cholesterol: 269 mg/dL — ABNORMAL HIGH (ref 0–200)
HDL: 49.4 mg/dL (ref >39.00)
Total CHOL/HDL Ratio: 5
Triglycerides: 187 mg/dL — ABNORMAL HIGH (ref 0.0–149.0)

## 2012-04-07 NOTE — Progress Notes (Signed)
Subjective:    Patient ID: Gina Weber, female    DOB: [birth year], 66 y.o.   MRN: [id number]  HPI 66 year old white female, nonsmoker, is in for a recheck of hypertension, hyperlipidemia, anxiety, history of breast cancer, hypokalemia. She has concerns today him back pain that occurs periodically rates it 8/10. Reports the pain being worse when she's been in the kitchen cooking. Typically takes a pain pill and uses a heating pad that helps to resolve the pain. She takes Mobic 7/2 mg daily. Describes the pain as sharp. Patient has concerns of possible Lyme's disease from a tick bite years ago. Has noticed more joint pain in achy numbness. She had a bone scan that was negative about 6 months ago.  The patient has a history of breast cancer with bilateral mastectomy.She's been cancer free since 2006. However, she was supposed to continue Aromasin x5 years. Patient stopped after 2 years.     Review of Systems  Constitutional: Negative.   HENT: Negative.   Respiratory: Negative.   Cardiovascular: Negative.   Gastrointestinal: Negative.   Genitourinary: Negative.   Musculoskeletal: Positive for back pain and arthralgias. Negative for joint swelling.  Skin: Negative.   Neurological: Negative.   Hematological: Negative.   Psychiatric/Behavioral: Negative.    Past Medical History  Diagnosis Date  . Fibromyalgia   . Arthritis   . Allergy   . Hyperlipidemia   . Hypertension   . GERD (gastroesophageal reflux disease)   . Depression   . COPD (chronic obstructive pulmonary disease)   . Anxiety   . Diverticulosis   . Breast cancer 1987 & 2006    radiation therapy    History   Social History  . Marital Status: Married    Spouse Name: N/A    Number of Children: N/A  . Years of Education: N/A   Occupational History  . Not on file.   Social History Main Topics  . Smoking status: Never Smoker   . Smokeless tobacco: Never Used  . Alcohol Use: No  . Drug Use: No  . Sexually Active:     Other Topics Concern  . Not on file   Social History Narrative  . No narrative on file    Past Surgical History  Procedure Date  . Abdominal hysterectomy   . Mastectomy 1987    bilateral f/u reconstruction  . Cataract extraction     left eye  . Appendectomy     Family History  Problem Relation Age of Onset  . Cancer Mother 25    esophageal   . Aneurysm Father     AAA  . Heart disease Father   . Depression Son     bipolar  . Colon cancer Neg Hx     Allergies  Allergen Reactions  . Pravastatin Sodium     REACTION: liver enzyme abnormality---per pt's report  . Statins     REACTION: leg pain, elevates liver enzymes  . Sulfamethoxazole     REACTION: hives    Current Outpatient Prescriptions on File Prior to Visit  Medication Sig Dispense Refill  . albuterol (PROAIR HFA) 108 (90 BASE) MCG/ACT inhaler Inhale 2 puffs into the lungs every 6 (six) hours as needed for wheezing.  18 g  5  . ALPRAZolam (XANAX) 0.5 MG tablet Take 1 tablet (0.5 mg total) by mouth at bedtime as needed. For sleep  90 tablet  0  . amitriptyline (ELAVIL) 10 MG tablet TAKE 2 TABLETS BY MOUTH AT BEDTIME  180 tablet  0  . amLODipine (NORVASC) 10 MG tablet TAKE ONE TABLET DAILY  90 tablet  1  . DULoxetine (CYMBALTA) 30 MG capsule Take 1 capsule (30 mg total) by mouth daily.  30 capsule  3  . Fluticasone-Salmeterol (ADVAIR DISKUS) 100-50 MCG/DOSE AEPB Inhale 1 puff into the lungs every 12 (twelve) hours.  180 each  3  . hydrochlorothiazide (,MICROZIDE/HYDRODIURIL,) 12.5 MG capsule Take 12.5 mg by mouth daily as needed. For fluid retention      . losartan (COZAAR) 100 MG tablet       . meloxicam (MOBIC) 7.5 MG tablet TAKE 1 TABLET DAILY  30 tablet  2  . montelukast (SINGULAIR) 10 MG tablet TAKE 1 TABLET AT BEDTIME  90 tablet  0  . NEXIUM 40 MG capsule TAKE ONE CAPSULE DAILY  90 capsule  29  . PARoxetine (PAXIL) 40 MG tablet TAKE ONE TABLET DAILY  90 tablet  1  . potassium chloride SA (K-DUR,KLOR-CON)  20 MEQ tablet Take 1 tablet (20 mEq total) by mouth daily.  30 tablet  0  . zolpidem (AMBIEN) 5 MG tablet Take 1 tablet (5 mg total) by mouth at bedtime as needed.  90 tablet  1    BP 120/78  Pulse 88  Wt 160 lb (72.576 kg)  SpO2 98%chart    Objective:   Physical Exam  Constitutional: She is oriented to person, place, and time. She appears well-developed and well-nourished.  HENT:  Right Ear: External ear normal.  Left Ear: External ear normal.  Nose: Nose normal.  Mouth/Throat: Oropharynx is clear and moist.  Neck: Neck supple.  Cardiovascular: Normal rate, regular rhythm and normal heart sounds.   Pulmonary/Chest: Effort normal and breath sounds normal.  Abdominal: Soft. Bowel sounds are normal.  Musculoskeletal: Normal range of motion.  Neurological: She is alert and oriented to person, place, and time. She has normal reflexes. She displays normal reflexes. No cranial nerve deficit. Coordination normal.  Skin: Skin is warm and dry.  Psychiatric: She has a normal mood and affect.          Assessment & Plan:  Assessment: Hypertension, hyperlipidemia, anxiety, history of breast cancer, hypokalemia, back pain  Plan: Since patient was never clear to oncology, without refer her back to [facility].Prescription given for broths to suit her needs. Continue Mobic. Lab sent to include BMP, lipid, CBC, TSH, RA factor, lives disease titer. Will notify patient pending results. Prescription given for Brost is suit her needs. We'll bring patient back for recheck of her chronic conditions in 3 months and sooner when necessary.

## 2012-04-07 NOTE — Addendum Note (Signed)
Addended byAdline Mango B on: 04/07/2012 10:10 AM   Modules accepted: Orders

## 2012-04-07 NOTE — Patient Instructions (Addendum)
Back Pain, Adult Low back pain is very common. About 1 in 5 people have back pain.The cause of low back pain is rarely dangerous. The pain often gets better over time.About half of people with a sudden onset of back pain feel better in just 2 weeks. About 8 in 10 people feel better by 6 weeks.  CAUSES Some common causes of back pain include:  Strain of the muscles or ligaments supporting the spine.  Wear and tear (degeneration) of the spinal discs.  Arthritis.  Direct injury to the back. DIAGNOSIS Most of the time, the direct cause of low back pain is not known.However, back pain can be treated effectively even when the exact cause of the pain is unknown.Answering your caregiver's questions about your overall health and symptoms is one of the most accurate ways to make sure the cause of your pain is not dangerous. If your caregiver needs more information, he or she may order lab work or imaging tests (X-rays or MRIs).However, even if imaging tests show changes in your back, this usually does not require surgery. HOME CARE INSTRUCTIONS For many people, back pain returns.Since low back pain is rarely dangerous, it is often a condition that people can learn to manageon their own.   Remain active. It is stressful on the back to sit or stand in one place. Do not sit, drive, or stand in one place for more than 30 minutes at a time. Take short walks on level surfaces as soon as pain allows.Try to increase the length of time you walk each day.  Do not stay in bed.Resting more than 1 or 2 days can delay your recovery.  Do not avoid exercise or work.Your body is made to move.It is not dangerous to be active, even though your back may hurt.Your back will likely heal faster if you return to being active before your pain is gone.  Pay attention to your body when you bend and lift. Many people have less discomfortwhen lifting if they bend their knees, keep the load close to their bodies,and  avoid twisting. Often, the most comfortable positions are those that put less stress on your recovering back.  Find a comfortable position to sleep. Use a firm mattress and lie on your side with your knees slightly bent. If you lie on your back, put a pillow under your knees.  Only take over-the-counter or prescription medicines as directed by your caregiver. Over-the-counter medicines to reduce pain and inflammation are often the most helpful.Your caregiver may prescribe muscle relaxant drugs.These medicines help dull your pain so you can more quickly return to your normal activities and healthy exercise.  Put ice on the injured area.  Put ice in a plastic bag.  Place a towel between your skin and the bag.  Leave the ice on for 15 to 20 minutes, 3 to 4 times a day for the first 2 to 3 days. After that, ice and heat may be alternated to reduce pain and spasms.  Ask your caregiver about trying back exercises and gentle massage. This may be of some benefit.  Avoid feeling anxious or stressed.Stress increases muscle tension and can worsen back pain.It is important to recognize when you are anxious or stressed and learn ways to manage it.Exercise is a great option. SEEK MEDICAL CARE IF:  You have pain that is not relieved with rest or medicine.  You have pain that does not improve in 1 week.  You have new symptoms.  You are generally   not feeling well. SEEK IMMEDIATE MEDICAL CARE IF:   You have pain that radiates from your back into your legs.  You develop new bowel or bladder control problems.  You have unusual weakness or numbness in your arms or legs.  You develop nausea or vomiting.  You develop abdominal pain.  You feel faint. Document Released: 06/14/2005 Document Revised: 12/14/2011 Document Reviewed: 11/02/2010 Exit[employer] Patient Information 2013 [employer].  

## 2012-04-10 ENCOUNTER — Other Ambulatory Visit: Payer: Self-pay | Admitting: Family

## 2012-04-10 ENCOUNTER — Encounter: Payer: Self-pay | Admitting: Family

## 2012-04-10 ENCOUNTER — Telehealth: Payer: Self-pay | Admitting: Family

## 2012-04-10 LAB — B. BURGDORFI ANTIBODIES: B burgdorferi Ab IgG+IgM: 0.39 {ISR}

## 2012-04-10 MED ORDER — LEVOTHYROXINE SODIUM 25 MCG PO TABS: 25.0000 ug | ORAL_TABLET | Freq: Every day | ORAL | Status: DC

## 2012-04-10 NOTE — Telephone Encounter (Signed)
Pt would like NP  to call her back concerning medication.

## 2012-04-10 NOTE — Telephone Encounter (Signed)
See lab note.  

## 2012-04-12 ENCOUNTER — Ambulatory Visit (HOSPITAL_COMMUNITY): Payer: Self-pay | Admitting: Psychiatry

## 2012-04-13 ENCOUNTER — Encounter: Payer: Self-pay | Admitting: Family

## 2012-04-18 ENCOUNTER — Emergency Department (HOSPITAL_COMMUNITY): Payer: Medicare Other

## 2012-04-18 ENCOUNTER — Encounter (HOSPITAL_COMMUNITY): Payer: Self-pay

## 2012-04-18 ENCOUNTER — Emergency Department (HOSPITAL_COMMUNITY)
Admission: EM | Admit: 2012-04-18 | Discharge: 2012-04-18 | Disposition: A | Discharge status: Home / Self Care | Payer: Medicare Other | Attending: Emergency Medicine | Admitting: Emergency Medicine

## 2012-04-18 DIAGNOSIS — J309 Allergic rhinitis, unspecified: Secondary | ICD-10-CM | POA: Insufficient documentation

## 2012-04-18 DIAGNOSIS — M129 Arthropathy, unspecified: Secondary | ICD-10-CM | POA: Insufficient documentation

## 2012-04-18 DIAGNOSIS — Z8719 Personal history of other diseases of the digestive system: Secondary | ICD-10-CM | POA: Insufficient documentation

## 2012-04-18 DIAGNOSIS — J4489 Other specified chronic obstructive pulmonary disease: Secondary | ICD-10-CM | POA: Insufficient documentation

## 2012-04-18 DIAGNOSIS — F411 Generalized anxiety disorder: Secondary | ICD-10-CM | POA: Insufficient documentation

## 2012-04-18 DIAGNOSIS — M549 Dorsalgia, unspecified: Secondary | ICD-10-CM

## 2012-04-18 DIAGNOSIS — I10 Essential (primary) hypertension: Secondary | ICD-10-CM | POA: Insufficient documentation

## 2012-04-18 DIAGNOSIS — K219 Gastro-esophageal reflux disease without esophagitis: Secondary | ICD-10-CM | POA: Insufficient documentation

## 2012-04-18 DIAGNOSIS — E785 Hyperlipidemia, unspecified: Secondary | ICD-10-CM | POA: Insufficient documentation

## 2012-04-18 DIAGNOSIS — F329 Major depressive disorder, single episode, unspecified: Secondary | ICD-10-CM | POA: Insufficient documentation

## 2012-04-18 DIAGNOSIS — F3289 Other specified depressive episodes: Secondary | ICD-10-CM | POA: Insufficient documentation

## 2012-04-18 DIAGNOSIS — IMO0002 Reserved for concepts with insufficient information to code with codable children: Secondary | ICD-10-CM | POA: Insufficient documentation

## 2012-04-18 DIAGNOSIS — J449 Chronic obstructive pulmonary disease, unspecified: Secondary | ICD-10-CM | POA: Insufficient documentation

## 2012-04-18 DIAGNOSIS — Z808 Family history of malignant neoplasm of other organs or systems: Secondary | ICD-10-CM | POA: Insufficient documentation

## 2012-04-18 DIAGNOSIS — Z853 Personal history of malignant neoplasm of breast: Secondary | ICD-10-CM | POA: Insufficient documentation

## 2012-04-18 DIAGNOSIS — Z79899 Other long term (current) drug therapy: Secondary | ICD-10-CM | POA: Insufficient documentation

## 2012-04-18 DIAGNOSIS — IMO0001 Reserved for inherently not codable concepts without codable children: Secondary | ICD-10-CM | POA: Insufficient documentation

## 2012-04-18 DIAGNOSIS — K5730 Diverticulosis of large intestine without perforation or abscess without bleeding: Secondary | ICD-10-CM | POA: Insufficient documentation

## 2012-04-18 MED ORDER — OXYCODONE-ACETAMINOPHEN 5-325 MG PO TABS: 1.0000 | ORAL_TABLET | Freq: Four times a day (QID) | ORAL | Status: AC | PRN

## 2012-04-18 NOTE — ED Provider Notes (Signed)
History  This chart was scribed for Gina Lennert, MD by Bennett Scrape. This patient was seen in room APA01/APA01 and the patient's care was started at 3:15PM.  CSN: [id number]  Arrival date & time 04/18/12  1445   First MD Initiated Contact with Patient 04/18/12 1515      Chief Complaint  Patient presents with  . Back Pain     Patient is a 66 y.o. female presenting with back pain. The history is provided by the patient. No language interpreter was used.  Back Pain  This is a recurrent problem. The current episode started more than 1 week ago. The problem occurs daily. The problem has been gradually worsening. The pain is associated with no known injury. The pain is present in the thoracic spine. The pain does not radiate. Pertinent negatives include no chest pain, no fever, no headaches, no abdominal pain, no bowel incontinence, no bladder incontinence, no dysuria and no leg pain.    Gina Weber is a 66 y.o. female who presents to the Emergency Department complaining of 2 to 3 months of gradual onset, gradually worsening, intermittent, non-radiating upper back pain located between the shoulder blades described as sharp and stabbing that started after she pulled her lawn mower out of a garage. The pain is worse with bending over and improved with laying down. She denies any recent falls or injuries. She reports laying on a heating pad and taking Vicodin prescribed by her PCP for fibromyalgia with mild improvement. She denies following up with her PCP for the symptoms. She states that she has had a CT scan performed in 2007 or 2008 that showed a growth on her T3 and ruptured discs in her lumbar spine and a bone scan in the past 6 months that showed diffuse arthritis throughout her back. She denies urinary incontinence, bowel incontinence, abdominal pain, nausea and urinary symptoms as associated symptoms. She also has a h/o HLD, GERD, COPD, Anxiety and Diverticulitis. She denies smoking and  alcohol use.  PCP is with Dr. Orvan Falconer with [facility] at [facility]  Past Medical History  Diagnosis Date  . Fibromyalgia   . Arthritis   . Allergy   . Hyperlipidemia   . Hypertension   . GERD (gastroesophageal reflux disease)   . Depression   . COPD (chronic obstructive pulmonary disease)   . Anxiety   . Diverticulosis   . Breast cancer 1987 & 2006    radiation therapy    Past Surgical History  Procedure Date  . Abdominal hysterectomy   . Mastectomy 1987    bilateral f/u reconstruction  . Cataract extraction     left eye  . Appendectomy     Family History  Problem Relation Age of Onset  . Cancer Mother 38    esophageal   . Aneurysm Father     AAA  . Heart disease Father   . Depression Son     bipolar  . Colon cancer Neg Hx     History  Substance Use Topics  . Smoking status: Never Smoker   . Smokeless tobacco: Never Used  . Alcohol Use: No    No OB history provided.  Review of Systems  Constitutional: Negative for fever and fatigue.  HENT: Negative for congestion, sinus pressure and ear discharge.   Eyes: Negative for discharge.  Respiratory: Negative for cough.   Cardiovascular: Negative for chest pain.  Gastrointestinal: Negative for abdominal pain, diarrhea and bowel incontinence.  Genitourinary: Negative for bladder incontinence, dysuria, frequency  and hematuria.  Musculoskeletal: Positive for back pain.  Skin: Negative for rash.  Neurological: Negative for seizures and headaches.  Hematological: Negative.   Psychiatric/Behavioral: Negative for hallucinations.    Allergies  Pravastatin sodium; Statins; and Sulfamethoxazole  Home Medications   Current Outpatient Rx  Name Route Sig Dispense Refill  . ALBUTEROL SULFATE HFA 108 (90 BASE) MCG/ACT IN AERS Inhalation Inhale 2 puffs into the lungs every 6 (six) hours as needed for wheezing. 18 g 5  . ALPRAZOLAM 0.5 MG PO TABS Oral Take 1 tablet (0.5 mg total) by mouth at bedtime as needed. For  sleep 90 tablet 0  . AMITRIPTYLINE HCL 10 MG PO TABS  TAKE 2 TABLETS BY MOUTH AT BEDTIME 180 tablet 0  . AMLODIPINE BESYLATE 10 MG PO TABS  TAKE ONE TABLET DAILY 90 tablet 1  . DULOXETINE HCL 30 MG PO CPEP Oral Take 1 capsule (30 mg total) by mouth daily. 30 capsule 3  . FLUTICASONE-SALMETEROL 100-50 MCG/DOSE IN AEPB Inhalation Inhale 1 puff into the lungs every 12 (twelve) hours. 180 each 3  . HYDROCHLOROTHIAZIDE 12.5 MG PO CAPS Oral Take 12.5 mg by mouth daily as needed. For fluid retention    . LEVOTHYROXINE SODIUM 25 MCG PO TABS Oral Take 1 tablet (25 mcg total) by mouth daily. 30 tablet 3  . LOSARTAN POTASSIUM 100 MG PO TABS      . MELOXICAM 7.5 MG PO TABS  TAKE 1 TABLET DAILY 30 tablet 2  . MONTELUKAST SODIUM 10 MG PO TABS  TAKE 1 TABLET AT BEDTIME 90 tablet 0  . NEXIUM 40 MG PO CPDR  TAKE ONE CAPSULE DAILY 90 capsule 29  . PAROXETINE HCL 40 MG PO TABS  TAKE ONE TABLET DAILY 90 tablet 1  . POTASSIUM CHLORIDE CRYS ER 20 MEQ PO TBCR Oral Take 1 tablet (20 mEq total) by mouth daily. 30 tablet 0  . ZOLPIDEM TARTRATE 5 MG PO TABS Oral Take 1 tablet (5 mg total) by mouth at bedtime as needed. 90 tablet 1    Triage Vitals: BP 145/71  Pulse 92  Temp 98 F (36.7 C) (Oral)  Resp 18  Ht 5\' 5"  (1.651 m)  Wt 160 lb (72.576 kg)  BMI 26.63 kg/m2  SpO2 100%  Physical Exam  Nursing note and vitals reviewed. Constitutional: She is oriented to person, place, and time. She appears well-developed and well-nourished.  HENT:  Head: Normocephalic and atraumatic.  Eyes: Conjunctivae normal and EOM are normal. No scleral icterus.  Neck: Neck supple. No thyromegaly present.  Cardiovascular: Normal rate and regular rhythm.  Exam reveals no gallop and no friction rub.   No murmur heard. Pulmonary/Chest: Effort normal and breath sounds normal. No stridor. She has no wheezes. She has no rales. She exhibits no tenderness.  Abdominal: She exhibits no distension. There is no tenderness. There is no rebound.    Musculoskeletal: Normal range of motion. She exhibits tenderness. She exhibits no edema.       No pain with ROM of the shoulders, moderate mid thoracic spine tenderness  Lymphadenopathy:    She has no cervical adenopathy.  Neurological: She is oriented to person, place, and time. Coordination normal.  Skin: No rash noted. No erythema.  Psychiatric: She has a normal mood and affect. Her behavior is normal.    ED Course  Procedures (including critical care time)  DIAGNOSTIC STUDIES: Oxygen Saturation is 100% on room air, normal by my interpretation.    COORDINATION OF CARE: 3:34 PM-Discussed  treatment plan which includes x-rays with pt at bedside and pt agreed to plan.  4:26 PM-Informed pt of radiology results. Discussed discharge plan which includes pain medications with pt and pt agreed to plan.  Labs Reviewed - No data to display Dg Thoracic Spine W/swimmers  04/18/2012  *RADIOLOGY REPORT*  Clinical Data: Back pain  THORACIC SPINE - 2 VIEW + SWIMMERS  Comparison: None.  Findings: Three views of thoracic spine submitted.  No acute fracture or subluxation.  Minimal degenerative changes mid thoracic spine.  IMPRESSION: No acute fracture or subluxation.   Original Report Authenticated By: Natasha Mead, M.D.      No diagnosis found.    MDM      The chart was scribed for me under my direct supervision.  I personally performed the history, physical, and medical decision making and all procedures in the evaluation of this patient.Gina Lennert, MD 04/18/12 1630

## 2012-04-18 NOTE — ED Notes (Signed)
Pt c/o pain in upper back between shoulder blades for the past week or more.  Reports has ruptured disc in that area and has pain flare up intermittently.

## 2012-04-19 ENCOUNTER — Telehealth: Payer: Self-pay

## 2012-04-19 MED ORDER — LOSARTAN POTASSIUM 100 MG PO TABS: 100.0000 mg | ORAL_TABLET | Freq: Every day | ORAL | Status: DC

## 2012-04-19 NOTE — Telephone Encounter (Signed)
Left detailed message to notify pt. Advised pt to schedule ov to discuss

## 2012-04-19 NOTE — Telephone Encounter (Signed)
At her next OV (if she want to wait) we can evaluate that issue and determine which scan needs to be done. Must have an office visit attached to the whichever scan we decide to do.

## 2012-04-19 NOTE — Telephone Encounter (Signed)
Pt states that the pain in her back is still present. She has not gotten the Rx filled for the percocet because she is taking a 1/2 tab of vicodin. She states that the medication upsets her stomach and makes her too drowsy during the day. She would like to know if she can get a CT scan or MRI of her back. The pain is primarily between her shoulder blades. Please advise

## 2012-04-20 ENCOUNTER — Other Ambulatory Visit: Payer: Self-pay | Admitting: Internal Medicine

## 2012-04-25 ENCOUNTER — Ambulatory Visit (INDEPENDENT_AMBULATORY_CARE_PROVIDER_SITE_OTHER): Payer: Medicare Other | Admitting: Psychiatry

## 2012-04-25 DIAGNOSIS — F329 Major depressive disorder, single episode, unspecified: Secondary | ICD-10-CM

## 2012-04-25 NOTE — Patient Instructions (Signed)
Discussed orally 
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
## 2012-04-26 ENCOUNTER — Other Ambulatory Visit: Payer: Self-pay | Admitting: Internal Medicine

## 2012-04-26 ENCOUNTER — Other Ambulatory Visit: Payer: Self-pay | Admitting: Family

## 2012-05-05 ENCOUNTER — Telehealth: Payer: Self-pay | Admitting: Family

## 2012-05-05 MED ORDER — POTASSIUM CHLORIDE 20 MEQ/15ML (10%) PO LIQD: 20.0000 meq | Freq: Every day | ORAL | Status: DC

## 2012-05-05 MED ORDER — LOSARTAN POTASSIUM 100 MG PO TABS: 100.0000 mg | ORAL_TABLET | Freq: Every day | ORAL | Status: DC

## 2012-05-05 NOTE — Telephone Encounter (Signed)
Patient called stating that she was supposed to have some meds called in for her cholestorol and she was also needing a refill of her potassium powder 20 meq called into [facility] in [place], Please assist.

## 2012-05-10 ENCOUNTER — Telehealth: Payer: Self-pay | Admitting: Family

## 2012-05-10 NOTE — Telephone Encounter (Signed)
Left message to advise pt that I will be out of the office tomorrow but will forward message to [facility] and she should receive a call tomorrow. Advised pt to call is she needs to in the morning

## 2012-05-10 NOTE — Telephone Encounter (Signed)
Pt called and said her bp is up. Pts bp was 147/86. Pt has a headache. Did not want to sch ov. Req call back from [place].

## 2012-05-10 NOTE — Telephone Encounter (Signed)
Caller: [place]/Patient; Patient Name: Gina Weber; PCP: Adline Mango [facility]); Best Callback Phone Number: [phone] States usually takes blood pressure medicines at bedtime but presure was elevated 11-13 and has headache but started 11-12. Pressure was 149/79. Took medicines(Cozaar 100mg  and Amlodipine 10mg ) 5 minutes prior to call. Took Advil 400mg  prior to call. Wants to know if MD wants to take additional blood pressure medicine. Advised  not to take additional medicine. Advised appointment 11-14 due to headache but states will call office if headache continues.

## 2012-05-11 NOTE — Telephone Encounter (Signed)
Watch salt intake. Ibuprofen for headache. Blood pressure is not high enough to be the cause of her headache.

## 2012-05-11 NOTE — Telephone Encounter (Signed)
Left message to advise pt of Padonda's note 

## 2012-05-26 ENCOUNTER — Other Ambulatory Visit: Payer: Self-pay | Admitting: Family

## 2012-06-09 ENCOUNTER — Other Ambulatory Visit: Payer: Self-pay | Admitting: Internal Medicine

## 2012-07-05 ENCOUNTER — Ambulatory Visit: Payer: Self-pay | Admitting: Family

## 2012-07-13 ENCOUNTER — Ambulatory Visit (HOSPITAL_COMMUNITY): Payer: Self-pay | Admitting: Psychiatry

## 2012-07-26 ENCOUNTER — Ambulatory Visit: Payer: Self-pay | Admitting: Family

## 2012-08-02 ENCOUNTER — Ambulatory Visit (INDEPENDENT_AMBULATORY_CARE_PROVIDER_SITE_OTHER): Payer: Medicare Other | Admitting: Family

## 2012-08-02 ENCOUNTER — Encounter: Payer: Self-pay | Admitting: Family

## 2012-08-02 VITALS — BP 130/68 | HR 93 | Wt 163.0 lb

## 2012-08-02 DIAGNOSIS — Z8249 Family history of ischemic heart disease and other diseases of the circulatory system: Secondary | ICD-10-CM

## 2012-08-02 DIAGNOSIS — IMO0001 Reserved for inherently not codable concepts without codable children: Secondary | ICD-10-CM

## 2012-08-02 DIAGNOSIS — F419 Anxiety disorder, unspecified: Secondary | ICD-10-CM | POA: Insufficient documentation

## 2012-08-02 DIAGNOSIS — E039 Hypothyroidism, unspecified: Secondary | ICD-10-CM

## 2012-08-02 DIAGNOSIS — I10 Essential (primary) hypertension: Secondary | ICD-10-CM

## 2012-08-02 DIAGNOSIS — E7800 Pure hypercholesterolemia, unspecified: Secondary | ICD-10-CM

## 2012-08-02 DIAGNOSIS — M1990 Unspecified osteoarthritis, unspecified site: Secondary | ICD-10-CM

## 2012-08-02 LAB — BASIC METABOLIC PANEL
BUN: 12 mg/dL (ref 6–23)
CO2: 26 mEq/L (ref 19–32)
Calcium: 9 mg/dL (ref 8.4–10.5)
Chloride: 107 mEq/L (ref 96–112)
Creatinine, Ser: 0.7 mg/dL (ref 0.4–1.2)

## 2012-08-02 LAB — LIPID PANEL
Cholesterol: 259 mg/dL — ABNORMAL HIGH (ref 0–200)
Triglycerides: 293 mg/dL — ABNORMAL HIGH (ref 0.0–149.0)
VLDL: 58.6 mg/dL — ABNORMAL HIGH (ref 0.0–40.0)

## 2012-08-02 LAB — LDL CHOLESTEROL, DIRECT: Direct LDL: 158.9 mg/dL

## 2012-08-02 LAB — HEPATIC FUNCTION PANEL
ALT: 39 U/L — ABNORMAL HIGH (ref 0–35)
Total Bilirubin: 0.6 mg/dL (ref 0.3–1.2)
Total Protein: 7 g/dL (ref 6.0–8.3)

## 2012-08-02 LAB — TSH: TSH: 1.48 u[IU]/mL (ref 0.35–5.50)

## 2012-08-02 MED ORDER — HYDROCODONE-ACETAMINOPHEN 10-325 MG PO TABS: 0.5000 | ORAL_TABLET | Freq: Three times a day (TID) | ORAL | Status: DC | PRN

## 2012-08-02 MED ORDER — ESOMEPRAZOLE MAGNESIUM 40 MG PO CPDR: 40.0000 mg | DELAYED_RELEASE_CAPSULE | Freq: Every day | ORAL | Status: DC

## 2012-08-02 MED ORDER — AMITRIPTYLINE HCL 10 MG PO TABS: 10.0000 mg | ORAL_TABLET | Freq: Every day | ORAL | Status: DC

## 2012-08-02 MED ORDER — ALPRAZOLAM 0.5 MG PO TABS: 0.5000 mg | ORAL_TABLET | Freq: Every evening | ORAL | Status: DC | PRN

## 2012-08-02 MED ORDER — MELOXICAM 7.5 MG PO TABS: 7.5000 mg | ORAL_TABLET | Freq: Every day | ORAL | Status: DC

## 2012-08-02 MED ORDER — POTASSIUM CHLORIDE 20 MEQ/15ML (10%) PO LIQD: 20.0000 meq | Freq: Every day | ORAL | Status: DC

## 2012-08-02 MED ORDER — AMLODIPINE BESYLATE 10 MG PO TABS: 10.0000 mg | ORAL_TABLET | Freq: Every day | ORAL | Status: DC

## 2012-08-02 MED ORDER — LOSARTAN POTASSIUM 100 MG PO TABS: 100.0000 mg | ORAL_TABLET | Freq: Every day | ORAL | Status: DC

## 2012-08-02 MED ORDER — LEVOTHYROXINE SODIUM 25 MCG PO TABS: 25.0000 ug | ORAL_TABLET | Freq: Every day | ORAL | Status: DC

## 2012-08-02 MED ORDER — MONTELUKAST SODIUM 10 MG PO TABS: 10.0000 mg | ORAL_TABLET | Freq: Every day | ORAL | Status: DC

## 2012-08-02 MED ORDER — DULOXETINE HCL 30 MG PO CPEP: 30.0000 mg | ORAL_CAPSULE | Freq: Every day | ORAL | Status: DC

## 2012-08-02 MED ORDER — ZOLPIDEM TARTRATE 5 MG PO TABS: 5.0000 mg | ORAL_TABLET | Freq: Every evening | ORAL | Status: DC | PRN

## 2012-08-02 NOTE — Patient Instructions (Signed)

## 2012-08-02 NOTE — Progress Notes (Signed)
Subjective:    Patient ID: Gina Weber, female    DOB: May 26, 1946, 67 y.o.   MRN: [id number]  HPI 67 year old white female, nonsmoker is in for recheck of hypertension, hypothyroidism, anxiety, GERD, osteoarthritis, and history of breast cancer. She's currently doing well on her medications. Patient felt much better since starting levothyroxine for thyroid. Fatigue has lessened significantly. Patient reports an episode of chest pain while she was at the oncologist office 2 months ago was sent to the emergency department had several tests that were all found to be negative. Patient has a family history of abdominal aortic aneurysm and is requesting to have a scan performed. She denies any lightheadedness, dizziness, chest pain, palpitations, shortness of breath or edema.   Review of Systems  Constitutional: Negative.   HENT: Negative.   Respiratory: Negative.   Cardiovascular: Negative.   Gastrointestinal: Negative.   Musculoskeletal: Negative.   Skin: Negative.   Neurological: Negative.   Hematological: Negative.   Psychiatric/Behavioral: Negative.    Past Medical History  Diagnosis Date  . Fibromyalgia   . Arthritis   . Allergy   . Hyperlipidemia   . Hypertension   . GERD (gastroesophageal reflux disease)   . Depression   . COPD (chronic obstructive pulmonary disease)   . Anxiety   . Diverticulosis   . Breast cancer 1987 & 2006    radiation therapy    History   Social History  . Marital Status: Married    Spouse Name: N/A    Number of Children: N/A  . Years of Education: N/A   Occupational History  . Not on file.   Social History Main Topics  . Smoking status: Never Smoker   . Smokeless tobacco: Never Used  . Alcohol Use: No  . Drug Use: No  . Sexually Active:    Other Topics Concern  . Not on file   Social History Narrative  . No narrative on file    Past Surgical History  Procedure Date  . Abdominal hysterectomy   . Mastectomy 1987    bilateral f/u  reconstruction  . Cataract extraction     left eye  . Appendectomy     Family History  Problem Relation Age of Onset  . Cancer Mother 40    esophageal   . Aneurysm Father     AAA  . Heart disease Father   . Depression Son     bipolar  . Colon cancer Neg Hx     Allergies  Allergen Reactions  . Pravastatin Sodium     REACTION: liver enzyme abnormality---per pt's report  . Statins     REACTION: leg pain, elevates liver enzymes  . Sulfamethoxazole     REACTION: hives    Current Outpatient Prescriptions on File Prior to Visit  Medication Sig Dispense Refill  . albuterol (PROAIR HFA) 108 (90 BASE) MCG/ACT inhaler Inhale 2 puffs into the lungs every 6 (six) hours as needed for wheezing.  18 g  5  . amitriptyline (ELAVIL) 10 MG tablet Take 1 tablet (10 mg total) by mouth at bedtime.  180 tablet  0  . amLODipine (NORVASC) 10 MG tablet Take 1 tablet (10 mg total) by mouth daily.  90 tablet  0  . DULoxetine (CYMBALTA) 30 MG capsule Take 1 capsule (30 mg total) by mouth daily.  90 capsule  0  . esomeprazole (NEXIUM) 40 MG capsule Take 1 capsule (40 mg total) by mouth daily.  90 capsule  0  . Fluticasone-Salmeterol (ADVAIR  DISKUS) 100-50 MCG/DOSE AEPB Inhale 1 puff into the lungs every 12 (twelve) hours.  180 each  3  . hydrochlorothiazide (,MICROZIDE/HYDRODIURIL,) 12.5 MG capsule Take 12.5 mg by mouth daily as needed. For fluid retention      . levothyroxine (LEVOTHROID) 25 MCG tablet Take 1 tablet (25 mcg total) by mouth daily.  90 tablet  0  . losartan (COZAAR) 100 MG tablet Take 1 tablet (100 mg total) by mouth daily.  90 tablet  0  . montelukast (SINGULAIR) 10 MG tablet Take 1 tablet (10 mg total) by mouth at bedtime.  90 tablet  0  . PARoxetine (PAXIL) 40 MG tablet TAKE ONE TABLET DAILY  90 tablet  1  . potassium chloride 20 MEQ/15ML (10%) solution Take 15 mLs (20 mEq total) by mouth daily.  500 mL  0  . potassium chloride SA (K-DUR,KLOR-CON) 20 MEQ tablet Take 1 tablet (20 mEq  total) by mouth daily.  30 tablet  0  . zolpidem (AMBIEN) 5 MG tablet Take 1 tablet (5 mg total) by mouth at bedtime as needed.  90 tablet  0    BP 130/68  Pulse 93  Wt 163 lb (73.936 kg)  SpO2 98%chart    Objective:   Physical Exam  Constitutional: She appears well-developed and well-nourished.  HENT:  Right Ear: External ear normal.  Left Ear: External ear normal.  Nose: Nose normal.  Mouth/Throat: Oropharynx is clear and moist.  Neck: Normal range of motion. Neck supple. No thyromegaly present.  Cardiovascular: Normal rate, regular rhythm and normal heart sounds.   Pulmonary/Chest: Effort normal and breath sounds normal.  Abdominal: Soft. Bowel sounds are normal.  Musculoskeletal: Normal range of motion.  Neurological: She is alert.  Skin: Skin is warm and dry.  Psychiatric: She has a normal mood and affect.          Assessment & Plan:  Assessment:  1. Hypertension 2. Hypothyroidism 3. Anxiety 4. GERD 5. Osteoarthritis 6. Breast cancer-history of  Plan: Continue current medications. Abdominal aortic aneurysm screening order placed. Lab sent. Encouraged healthy diet and exercise. Patient advised to call the office if symptoms worsen or persist. Recheck a schedule, and as needed.

## 2012-08-12 ENCOUNTER — Other Ambulatory Visit: Payer: Self-pay

## 2012-08-12 ENCOUNTER — Encounter: Payer: Self-pay | Admitting: Family

## 2012-08-16 ENCOUNTER — Telehealth: Payer: Self-pay | Admitting: Family

## 2012-08-16 MED ORDER — POTASSIUM CHLORIDE 20 MEQ/15ML (10%) PO LIQD: 20.0000 meq | Freq: Every day | ORAL | Status: DC

## 2012-08-16 MED ORDER — DULOXETINE HCL 30 MG PO CPEP: 30.0000 mg | ORAL_CAPSULE | Freq: Every day | ORAL | Status: DC

## 2012-08-16 NOTE — Telephone Encounter (Signed)
Done and noted.

## 2012-08-16 NOTE — Telephone Encounter (Signed)
Done

## 2012-08-16 NOTE — Telephone Encounter (Signed)
Patient called stating that she would lik to have a 30 day supply of cymbalta and potassium sent to  [facility] [place] and would like to have all of her rxs to be sent to [facility] in [place] going forward. Please assist.

## 2012-08-18 ENCOUNTER — Encounter (INDEPENDENT_AMBULATORY_CARE_PROVIDER_SITE_OTHER): Payer: Medicare Other

## 2012-08-18 DIAGNOSIS — E7800 Pure hypercholesterolemia, unspecified: Secondary | ICD-10-CM

## 2012-08-18 DIAGNOSIS — IMO0001 Reserved for inherently not codable concepts without codable children: Secondary | ICD-10-CM

## 2012-08-18 DIAGNOSIS — I10 Essential (primary) hypertension: Secondary | ICD-10-CM

## 2012-08-18 DIAGNOSIS — Z8249 Family history of ischemic heart disease and other diseases of the circulatory system: Secondary | ICD-10-CM

## 2012-08-19 ENCOUNTER — Encounter: Payer: Self-pay | Admitting: Family

## 2012-08-22 ENCOUNTER — Other Ambulatory Visit: Payer: Self-pay | Admitting: Family

## 2012-08-22 DIAGNOSIS — R079 Chest pain, unspecified: Secondary | ICD-10-CM

## 2012-08-24 ENCOUNTER — Telehealth: Payer: Self-pay | Admitting: Family

## 2012-08-24 MED ORDER — POTASSIUM CHLORIDE ER 10 MEQ PO TBCR: 10.0000 meq | EXTENDED_RELEASE_TABLET | Freq: Two times a day (BID) | ORAL | Status: DC

## 2012-08-24 NOTE — Telephone Encounter (Signed)
Tamesha, due to a Medicare denial on the liquid KCl, can you please send in a new rx (per Padonda) for Potassium chloride tablets, , #60 for 30 SIG: 2 tablets daily?  Patient uses  [address] in [place], [place] on [address]. Thank you.

## 2012-08-24 NOTE — Telephone Encounter (Signed)
Rx sent to pharmacy   

## 2012-08-25 ENCOUNTER — Institutional Professional Consult (permissible substitution): Payer: Self-pay | Admitting: Cardiology

## 2012-09-06 ENCOUNTER — Other Ambulatory Visit: Payer: Self-pay | Admitting: Cardiovascular Disease

## 2012-09-06 ENCOUNTER — Ambulatory Visit (HOSPITAL_COMMUNITY)
Admission: RE | Admit: 2012-09-06 | Discharge: 2012-09-06 | Disposition: A | Discharge status: Home / Self Care | Payer: Medicare Other | Source: Ambulatory Visit | Attending: Cardiovascular Disease | Admitting: Cardiovascular Disease

## 2012-09-06 ENCOUNTER — Ambulatory Visit (INDEPENDENT_AMBULATORY_CARE_PROVIDER_SITE_OTHER): Payer: Medicare Other | Admitting: Cardiovascular Disease

## 2012-09-06 ENCOUNTER — Encounter: Payer: Self-pay | Admitting: Cardiovascular Disease

## 2012-09-06 ENCOUNTER — Encounter: Payer: Self-pay | Admitting: *Deleted

## 2012-09-06 VITALS — BP 114/68 | HR 83 | Ht 65.0 in | Wt 162.0 lb

## 2012-09-06 DIAGNOSIS — J984 Other disorders of lung: Secondary | ICD-10-CM | POA: Insufficient documentation

## 2012-09-06 DIAGNOSIS — R079 Chest pain, unspecified: Secondary | ICD-10-CM

## 2012-09-06 NOTE — Assessment & Plan Note (Signed)
Cholesterol is at goal.  Continue current dose of statin and diet Rx.  No myalgias or side effects.  F/U  LFT's in 6 months. Lab Results  Component Value Date   LDLCALC 111* 05/13/2010

## 2012-09-06 NOTE — Assessment & Plan Note (Signed)
Progressive with some classic features of squeezing pressure brought on by both mental and physical stress.  Relief with nitro.  Discussed options and cath indicated. She has had breast CA and probably should be right femoral approach.  Will set up for Dr Shirlee Latch next week and I have no cath days in the next 10 days.  CXR and labs today Risks discussed and willing to proceed. She just wants to have versed/valium for case

## 2012-09-06 NOTE — Progress Notes (Signed)
Patient ID: Gina Weber, female   DOB: 03/20/1946, 67 y.o.   MRN: [id number] 67 year old white female, nonsmoker is in for recheck of hypertension, hypothyroidism, anxiety, GERD, osteoarthritis, and history of breast cancer. She's currently doing well on her medications. Patient felt much better since starting levothyroxine for thyroid. Fatigue has lessened significantly. Patient reports an episode of chest pain while she was at the oncologist office 2 months ago was sent to the emergency department had several tests that were all found to be negative  Continues to have SSCP with stress and walking.  Given nitro a couple of weeks ago and took it once with relief.  Previously seen by Dr Jens Som.  06/04/09 las visit  Had cath in 2008 that was normal.  She has had breast cancer x2 and is currently stable.  Not a candidate for right radial cath due to previous breast CA   ROS: Denies fever, malais, weight loss, blurry vision, decreased visual acuity, cough, sputum, SOB, hemoptysis, pleuritic pain, palpitaitons, heartburn, abdominal pain, melena, lower extremity edema, claudication, or rash.  All other systems reviewed and negative   General: Affect appropriate Healthy:  appears stated age HEENT: normal Neck supple with no adenopathy JVP normal no bruits no thyromegaly Lungs clear with no wheezing and good diaphragmatic motion Heart:  S1/S2 no murmur,rub, gallop or click PMI normal Abdomen: benighn, BS positve, no tenderness, no AAA no bruit.  No HSM or HJR Distal pulses intact with no bruits No edema Neuro non-focal Skin warm and dry No muscular weakness  Medications Current Outpatient Prescriptions  Medication Sig Dispense Refill  . albuterol (PROAIR HFA) 108 (90 BASE) MCG/ACT inhaler Inhale 2 puffs into the lungs every 6 (six) hours as needed for wheezing.  18 g  5  . ALPRAZolam (XANAX) 0.5 MG tablet Take 1 tablet (0.5 mg total) by mouth at bedtime as needed. For sleep  90 tablet  0  .  amitriptyline (ELAVIL) 10 MG tablet Take 20 mg by mouth at bedtime.      Marland Kitchen amLODipine (NORVASC) 10 MG tablet Take 1 tablet (10 mg total) by mouth daily.  90 tablet  0  . DULoxetine (CYMBALTA) 30 MG capsule Take 1 capsule (30 mg total) by mouth daily.  30 capsule  0  . esomeprazole (NEXIUM) 40 MG capsule Take 1 capsule (40 mg total) by mouth daily.  90 capsule  0  . Fluticasone-Salmeterol (ADVAIR DISKUS) 100-50 MCG/DOSE AEPB Inhale 1 puff into the lungs every 12 (twelve) hours.  180 each  3  . HYDROcodone-acetaminophen (NORCO) 10-325 MG per tablet Take 0.5-1 tablets by mouth every 8 (eight) hours as needed for pain.  30 tablet  3  . levothyroxine (LEVOTHROID) 25 MCG tablet Take 1 tablet (25 mcg total) by mouth daily.  90 tablet  0  . losartan (COZAAR) 100 MG tablet Take 1 tablet (100 mg total) by mouth daily.  90 tablet  0  . meloxicam (MOBIC) 7.5 MG tablet Take 1 tablet (7.5 mg total) by mouth daily.  90 tablet  0  . montelukast (SINGULAIR) 10 MG tablet Take 1 tablet (10 mg total) by mouth at bedtime.  90 tablet  0  . potassium chloride (K-DUR) 10 MEQ tablet Take 1 tablet (10 mEq total) by mouth 2 (two) times daily.  60 tablet  0  . zolpidem (AMBIEN) 5 MG tablet Take 1 tablet (5 mg total) by mouth at bedtime as needed.  90 tablet  0   No current facility-administered medications for  this visit.    Allergies Pravastatin sodium; Statins; and Sulfamethoxazole  Family History: Family History  Problem Relation Age of Onset  . Cancer Mother 32    esophageal   . Aneurysm Father     AAA  . Heart disease Father   . Depression Son     bipolar  . Colon cancer Neg Hx     Social History: History   Social History  . Marital Status: Married    Spouse Name: N/A    Number of Children: N/A  . Years of Education: N/A   Occupational History  . Not on file.   Social History Main Topics  . Smoking status: Never Smoker   . Smokeless tobacco: Never Used  . Alcohol Use: No  . Drug Use: No  .  Sexually Active:    Other Topics Concern  . Not on file   Social History Narrative  . No narrative on file    Electrocardiogram:  NSR rate 79 normal  Assessment and Plan

## 2012-09-06 NOTE — Patient Instructions (Addendum)
Your physician has requested that you have a cardiac catheterization. Cardiac catheterization is used to diagnose and/or treat various heart conditions. Doctors may recommend this procedure for a number of different reasons. The most common reason is to evaluate chest pain. Chest pain can be a symptom of coronary artery disease (CAD), and cardiac catheterization can show whether plaque is narrowing or blocking your heart's arteries. This procedure is also used to evaluate the valves, as well as measure the blood flow and oxygen levels in different parts of your heart. For further information please visit [email or web address] Please follow instruction sheet, as given.   Your physician recommends that you return for lab work today: cbc,bmp,pt  For your upcoming heart catheterization  A chest x-ray takes a picture of the organs and structures inside the chest, including the heart, lungs, and blood vessels. This test can show several things, including, whether the heart is enlarges; whether fluid is building up in the lungs; and whether pacemaker / defibrillator leads are still in place.

## 2012-09-06 NOTE — Assessment & Plan Note (Signed)
Well controlled.  Continue current medications and low sodium Dash type diet.    

## 2012-09-07 LAB — CBC WITH DIFFERENTIAL/PLATELET
Eosinophils Absolute: 0.2 10*3/uL (ref 0.0–0.7)
Hemoglobin: 13.7 g/dL (ref 12.0–15.0)
Lymphs Abs: 1.3 10*3/uL (ref 0.7–4.0)
MCH: 29.7 pg (ref 26.0–34.0)
Monocytes Relative: 9 % (ref 3–12)
Neutro Abs: 4.2 10*3/uL (ref 1.7–7.7)
Neutrophils Relative %: 67 % (ref 43–77)
RBC: 4.61 MIL/uL (ref 3.87–5.11)

## 2012-09-07 LAB — PROTIME-INR
INR: 0.95 (ref <1.50)
Prothrombin Time: 12.7 seconds (ref 11.6–15.2)

## 2012-09-07 LAB — BASIC METABOLIC PANEL
Chloride: 104 mEq/L (ref 96–112)
Potassium: 4.5 mEq/L (ref 3.5–5.3)

## 2012-09-08 ENCOUNTER — Ambulatory Visit (HOSPITAL_COMMUNITY): Payer: Self-pay | Admitting: Psychiatry

## 2012-09-11 ENCOUNTER — Telehealth: Payer: Self-pay | Admitting: *Deleted

## 2012-09-11 NOTE — Telephone Encounter (Signed)
Pt left message with TJ to request a reschedule for her Cath noted today 09-11-12 per inclement weather, contact [facility] lab and unable to contact at this time, will call again tomorrow to schedule for pt, called pt to advise, left vm to advise we will call her with the apt once available

## 2012-09-12 NOTE — Telephone Encounter (Signed)
Pt returned call to advise she listened to her vm, instructions/apt date/time given, pt understood and will attend apt for Cath on 09-14-12

## 2012-09-12 NOTE — Telephone Encounter (Signed)
Called [facility] cath lab and scheduled pt for 09-14-12 with MD Excell Seltzer at 11:30am, pt will need to arrive at 10:30am, pt not available to advised new apt date and time, left detailed message with updated information, advised to call office to discuss, will call pt until contact is made to confirm

## 2012-09-14 ENCOUNTER — Inpatient Hospital Stay (HOSPITAL_BASED_OUTPATIENT_CLINIC_OR_DEPARTMENT_OTHER)
Admission: RE | Admit: 2012-09-14 | Discharge: 2012-09-14 | Disposition: A | Discharge status: Home / Self Care | Payer: Medicare Other | Source: Ambulatory Visit | Attending: Cardiovascular Disease | Admitting: Cardiovascular Disease

## 2012-09-14 ENCOUNTER — Encounter (HOSPITAL_BASED_OUTPATIENT_CLINIC_OR_DEPARTMENT_OTHER)
Admission: RE | Disposition: A | Discharge status: Home / Self Care | Payer: Self-pay | Source: Ambulatory Visit | Attending: Cardiovascular Disease

## 2012-09-14 DIAGNOSIS — Z853 Personal history of malignant neoplasm of breast: Secondary | ICD-10-CM | POA: Insufficient documentation

## 2012-09-14 DIAGNOSIS — E039 Hypothyroidism, unspecified: Secondary | ICD-10-CM | POA: Insufficient documentation

## 2012-09-14 DIAGNOSIS — R0789 Other chest pain: Secondary | ICD-10-CM | POA: Insufficient documentation

## 2012-09-14 DIAGNOSIS — I10 Essential (primary) hypertension: Secondary | ICD-10-CM | POA: Insufficient documentation

## 2012-09-14 DIAGNOSIS — R079 Chest pain, unspecified: Secondary | ICD-10-CM

## 2012-09-14 SURGERY — JV LEFT HEART CATHETERIZATION WITH CORONARY ANGIOGRAM

## 2012-09-14 MED ORDER — ACETAMINOPHEN 325 MG PO TABS: 650.0000 mg | ORAL_TABLET | ORAL | Status: DC | PRN

## 2012-09-14 MED ORDER — ONDANSETRON HCL 4 MG/2ML IJ SOLN: 4.0000 mg | Freq: Four times a day (QID) | INTRAMUSCULAR | Status: DC | PRN

## 2012-09-14 MED ORDER — SODIUM CHLORIDE 0.9 % IV SOLN: INTRAVENOUS | Status: AC

## 2012-09-14 MED ORDER — SODIUM CHLORIDE 0.9 % IV SOLN
INTRAVENOUS | Status: DC
  Administered 2012-09-14: 11:00:00 via INTRAVENOUS

## 2012-09-14 NOTE — Interval H&P Note (Signed)
History and Physical Interval Note:  09/14/2012 11:12 AM  Gina Weber  has presented today for cardiac cath  with the diagnosis of cp  The various methods of treatment have been discussed with the patient and family. After consideration of risks, benefits and other options for treatment, the patient has consented to  Procedure(s): [facility] LEFT HEART CATHETERIZATION WITH CORONARY ANGIOGRAM (N/A) as a surgical intervention .  The patient's history has been reviewed, patient examined, no change in status, stable for surgery.  I have reviewed the patient's chart and labs.  Questions were answered to the patient's satisfaction.     MCALHANY,CHRISTOPHER

## 2012-09-14 NOTE — OR Nursing (Signed)
Meal served 

## 2012-09-14 NOTE — H&P (View-Only) (Signed)
Patient ID: Gina Weber, female   DOB: [birth year], 67 y.o.   MRN: [id number] 67 year old white female, nonsmoker is in for recheck of hypertension, hypothyroidism, anxiety, GERD, osteoarthritis, and history of breast cancer. She's currently doing well on her medications. Patient felt much better since starting levothyroxine for thyroid. Fatigue has lessened significantly. Patient reports an episode of chest pain while she was at the oncologist office 2 months ago was sent to the emergency department had several tests that were all found to be negative  Continues to have SSCP with stress and walking.  Given nitro a couple of weeks ago and took it once with relief.  Previously seen by Dr Jens Som.  06/04/09 las visit  Had cath in 2008 that was normal.  She has had breast cancer x2 and is currently stable.  Not a candidate for right radial cath due to previous breast CA   ROS: Denies fever, malais, weight loss, blurry vision, decreased visual acuity, cough, sputum, SOB, hemoptysis, pleuritic pain, palpitaitons, heartburn, abdominal pain, melena, lower extremity edema, claudication, or rash.  All other systems reviewed and negative   General: Affect appropriate Healthy:  appears stated age HEENT: normal Neck supple with no adenopathy JVP normal no bruits no thyromegaly Lungs clear with no wheezing and good diaphragmatic motion Heart:  S1/S2 no murmur,rub, gallop or click PMI normal Abdomen: benighn, BS positve, no tenderness, no AAA no bruit.  No HSM or HJR Distal pulses intact with no bruits No edema Neuro non-focal Skin warm and dry No muscular weakness  Medications Current Outpatient Prescriptions  Medication Sig Dispense Refill  . albuterol (PROAIR HFA) 108 (90 BASE) MCG/ACT inhaler Inhale 2 puffs into the lungs every 6 (six) hours as needed for wheezing.  18 g  5  . ALPRAZolam (XANAX) 0.5 MG tablet Take 1 tablet (0.5 mg total) by mouth at bedtime as needed. For sleep  90 tablet  0  .  amitriptyline (ELAVIL) 10 MG tablet Take 20 mg by mouth at bedtime.      Marland Kitchen amLODipine (NORVASC) 10 MG tablet Take 1 tablet (10 mg total) by mouth daily.  90 tablet  0  . DULoxetine (CYMBALTA) 30 MG capsule Take 1 capsule (30 mg total) by mouth daily.  30 capsule  0  . esomeprazole (NEXIUM) 40 MG capsule Take 1 capsule (40 mg total) by mouth daily.  90 capsule  0  . Fluticasone-Salmeterol (ADVAIR DISKUS) 100-50 MCG/DOSE AEPB Inhale 1 puff into the lungs every 12 (twelve) hours.  180 each  3  . HYDROcodone-acetaminophen (NORCO) 10-325 MG per tablet Take 0.5-1 tablets by mouth every 8 (eight) hours as needed for pain.  30 tablet  3  . levothyroxine (LEVOTHROID) 25 MCG tablet Take 1 tablet (25 mcg total) by mouth daily.  90 tablet  0  . losartan (COZAAR) 100 MG tablet Take 1 tablet (100 mg total) by mouth daily.  90 tablet  0  . meloxicam (MOBIC) 7.5 MG tablet Take 1 tablet (7.5 mg total) by mouth daily.  90 tablet  0  . montelukast (SINGULAIR) 10 MG tablet Take 1 tablet (10 mg total) by mouth at bedtime.  90 tablet  0  . potassium chloride (K-DUR) 10 MEQ tablet Take 1 tablet (10 mEq total) by mouth 2 (two) times daily.  60 tablet  0  . zolpidem (AMBIEN) 5 MG tablet Take 1 tablet (5 mg total) by mouth at bedtime as needed.  90 tablet  0   No current facility-administered medications for  this visit.    Allergies Pravastatin sodium; Statins; and Sulfamethoxazole  Family History: Family History  Problem Relation Age of Onset  . Cancer Mother 64    esophageal   . Aneurysm Father     AAA  . Heart disease Father   . Depression Son     bipolar  . Colon cancer Neg Hx     Social History: History   Social History  . Marital Status: Married    Spouse Name: N/A    Number of Children: N/A  . Years of Education: N/A   Occupational History  . Not on file.   Social History Main Topics  . Smoking status: Never Smoker   . Smokeless tobacco: Never Used  . Alcohol Use: No  . Drug Use: No  .  Sexually Active:    Other Topics Concern  . Not on file   Social History Narrative  . No narrative on file    Electrocardiogram:  NSR rate 79 normal  Assessment and Plan

## 2012-09-14 NOTE — Progress Notes (Signed)
Bedrest begins @ 1215, tegaderm dressing applied to right groin site by Venda Rodes, site level 0.

## 2012-09-14 NOTE — CV Procedure (Signed)
   Cardiac Catheterization Operative Report  Gina Weber [id number] 3/20/201411:58 AM CAMPBELL, [name], FNP  Procedure Performed:  1. Left Heart Catheterization 2. Selective Coronary Angiography 3. Left ventricular angiogram  Operator: Verne Carrow, MD  Indication:   67 year old white female, nonsmoker with history of  hypertension, hypothyroidism, anxiety, GERD, osteoarthritis, and history of breast cancer who continues to have episodes of sub-sternal chest pressure with exercise. Diagnostic cath arranged by Dr. Eden Emms to exclude CAD.                                     Procedure Details: The risks, benefits, complications, treatment options, and expected outcomes were discussed with the patient. The patient and/or family concurred with the proposed plan, giving informed consent. The patient was brought to the cath lab after IV hydration was begun and oral premedication was given. The patient was further sedated with Versed and Fentanyl. The right groin was prepped and draped in the usual manner. Using the modified Seldinger access technique, a 4 French sheath was placed in the right femoral artery. Standard diagnostic catheters were used to perform selective coronary angiography. A pigtail catheter was used to perform a left ventricular angiogram.  There were no immediate complications. The patient was taken to the recovery area in stable condition.   Hemodynamic Findings: Central aortic pressure: 135/71 Left ventricular pressure: 150/14/15  Angiographic Findings:  Left main: No obstructive disease. Short segment.   Left Anterior Descending Artery: Large caliber vessel that courses to the apex. Moderate caliber diagonal branch. No obstructive disease noted.   Circumflex Artery: Moderate caliber vessel with three obtuse marginal branches. No obstructive disease noted.   Right Coronary Artery: Small non-dominant vessel with no obstructive disease noted.   Left  Ventricular Angiogram: LVEF=65%   Impression: 1. No angiographic evidence of CAD 2. Normal LV function 3. Non-cardiac chest pain  Recommendations: No further ischemic workup.        Complications:  None. The patient tolerated the procedure well.

## 2012-09-19 ENCOUNTER — Institutional Professional Consult (permissible substitution): Payer: Self-pay | Admitting: Cardiology

## 2012-09-25 ENCOUNTER — Other Ambulatory Visit: Payer: Self-pay

## 2012-09-25 MED ORDER — POTASSIUM CHLORIDE ER 10 MEQ PO TBCR: 10.0000 meq | EXTENDED_RELEASE_TABLET | Freq: Two times a day (BID) | ORAL | Status: DC

## 2012-09-28 ENCOUNTER — Encounter: Payer: Self-pay | Admitting: Adult Health

## 2012-09-28 ENCOUNTER — Other Ambulatory Visit: Payer: Self-pay

## 2012-09-28 MED ORDER — ESOMEPRAZOLE MAGNESIUM 40 MG PO CPDR: 40.0000 mg | DELAYED_RELEASE_CAPSULE | Freq: Every day | ORAL | Status: DC

## 2012-09-28 NOTE — Progress Notes (Signed)
   HPI: Gina Weber is a 67 year old female patient of Dr. Eden Emms we are following status post hospitalization for cardiac catheterization the setting of recurrent chest pain with stress and walking. Cardiac catheterization was completed on 09/14/2012 history no angiographic evidence of CAD with normal LV function. She was found to have noncardiac chest pain with no further ischemic workup planned. Patient has a history of hypertension hypothyroidism anxiety GERD osteoarthritis and breast cancer.  Allergies  Allergen Reactions  . Pravastatin Sodium     REACTION: liver enzyme abnormality---per pt's report  . Statins     REACTION: leg pain, elevates liver enzymes  . Sulfamethoxazole     REACTION: hives    Current Outpatient Prescriptions  Medication Sig Dispense Refill  . albuterol (PROAIR HFA) 108 (90 BASE) MCG/ACT inhaler Inhale 2 puffs into the lungs every 6 (six) hours as needed for wheezing.  18 g  5  . ALPRAZolam (XANAX) 0.5 MG tablet Take 1 tablet (0.5 mg total) by mouth at bedtime as needed. For sleep  90 tablet  0  . amitriptyline (ELAVIL) 10 MG tablet Take 20 mg by mouth at bedtime.      Marland Kitchen amLODipine (NORVASC) 10 MG tablet Take 1 tablet (10 mg total) by mouth daily.  90 tablet  0  . DULoxetine (CYMBALTA) 30 MG capsule Take 1 capsule (30 mg total) by mouth daily.  30 capsule  0  . esomeprazole (NEXIUM) 40 MG capsule Take 1 capsule (40 mg total) by mouth daily.  90 capsule  0  . Fluticasone-Salmeterol (ADVAIR DISKUS) 100-50 MCG/DOSE AEPB Inhale 1 puff into the lungs every 12 (twelve) hours.  180 each  3  . HYDROcodone-acetaminophen (NORCO) 10-325 MG per tablet Take 0.5-1 tablets by mouth every 8 (eight) hours as needed for pain.  30 tablet  3  . levothyroxine (LEVOTHROID) 25 MCG tablet Take 1 tablet (25 mcg total) by mouth daily.  90 tablet  0  . losartan (COZAAR) 100 MG tablet Take 1 tablet (100 mg total) by mouth daily.  90 tablet  0  . meloxicam (MOBIC) 7.5 MG tablet Take 1 tablet  (7.5 mg total) by mouth daily.  90 tablet  0  . montelukast (SINGULAIR) 10 MG tablet Take 1 tablet (10 mg total) by mouth at bedtime.  90 tablet  0  . potassium chloride (K-DUR) 10 MEQ tablet Take 1 tablet (10 mEq total) by mouth 2 (two) times daily.  60 tablet  2  . zolpidem (AMBIEN) 5 MG tablet Take 1 tablet (5 mg total) by mouth at bedtime as needed.  90 tablet  0   No current facility-administered medications for this visit.    Past Medical History  Diagnosis Date  . Fibromyalgia   . Arthritis   . Allergy   . Hyperlipidemia   . Hypertension   . GERD (gastroesophageal reflux disease)   . Depression   . COPD (chronic obstructive pulmonary disease)   . Anxiety   . Diverticulosis   . Breast cancer 1987 & 2006    radiation therapy    Past Surgical History  Procedure Laterality Date  . Abdominal hysterectomy    . Mastectomy  1987    bilateral f/u reconstruction  . Cataract extraction      left eye  . Appendectomy      ROS: PHYSICAL EXAM There were no vitals taken for this visit.  EKG:  ASSESSMENT AND PLAN

## 2012-09-29 ENCOUNTER — Encounter: Payer: Self-pay | Admitting: Adult Health

## 2012-09-29 NOTE — Patient Instructions (Signed)
Your physician recommends that you schedule a follow-up appointment in:  

## 2012-10-03 ENCOUNTER — Other Ambulatory Visit: Payer: Self-pay

## 2012-10-03 MED ORDER — ESOMEPRAZOLE MAGNESIUM 40 MG PO CPDR: 40.0000 mg | DELAYED_RELEASE_CAPSULE | Freq: Every day | ORAL | Status: DC

## 2012-10-05 ENCOUNTER — Encounter: Payer: Self-pay | Admitting: Adult Health

## 2012-10-05 ENCOUNTER — Ambulatory Visit (INDEPENDENT_AMBULATORY_CARE_PROVIDER_SITE_OTHER): Payer: Medicare Other | Admitting: Adult Health

## 2012-10-05 ENCOUNTER — Other Ambulatory Visit: Payer: Self-pay

## 2012-10-05 VITALS — BP 138/78 | HR 85 | Ht 65.0 in | Wt 163.0 lb

## 2012-10-05 DIAGNOSIS — R079 Chest pain, unspecified: Secondary | ICD-10-CM

## 2012-10-05 DIAGNOSIS — I10 Essential (primary) hypertension: Secondary | ICD-10-CM

## 2012-10-05 MED ORDER — ESOMEPRAZOLE MAGNESIUM 40 MG PO CPDR: 40.0000 mg | DELAYED_RELEASE_CAPSULE | Freq: Every day | ORAL | Status: DC

## 2012-10-05 NOTE — Progress Notes (Deleted)
Name: Gina Weber    DOB: [birth year]  Age: 67 y.o.  MR#: [id number]       PCP:  Janell Quiet, FNP      Insurance: Payor: MEDICARE  Plan: MEDICARE PART A AND B  Product Type: *No Product type*    CC:    Chief Complaint  Patient presents with  . Hypertension    VS Filed Vitals:   10/05/12 1402  BP: 138/78  Pulse: 85  Height: 5\' 5"  (1.651 m)  Weight: 163 lb (73.936 kg)  SpO2: 98%    Weights Current Weight  10/05/12 163 lb (73.936 kg)  09/14/12 162 lb (73.483 kg)  09/14/12 162 lb (73.483 kg)    Blood Pressure  BP Readings from Last 3 Encounters:  10/05/12 138/78  09/14/12 127/69  09/14/12 127/69     Admit date:  (Not on file) Last encounter with RMR:  09/28/2012   Allergy Pravastatin sodium; Statins; and Sulfamethoxazole  Current Outpatient Prescriptions  Medication Sig Dispense Refill  . albuterol (PROAIR HFA) 108 (90 BASE) MCG/ACT inhaler Inhale 2 puffs into the lungs every 6 (six) hours as needed for wheezing.  18 g  5  . ALPRAZolam (XANAX) 0.5 MG tablet Take 1 tablet (0.5 mg total) by mouth at bedtime as needed. For sleep  90 tablet  0  . amitriptyline (ELAVIL) 10 MG tablet Take 20 mg by mouth at bedtime.      Marland Kitchen amLODipine (NORVASC) 10 MG tablet Take 1 tablet (10 mg total) by mouth daily.  90 tablet  0  . DULoxetine (CYMBALTA) 30 MG capsule Take 1 capsule (30 mg total) by mouth daily.  30 capsule  0  . esomeprazole (NEXIUM) 40 MG capsule Take 1 capsule (40 mg total) by mouth daily.  90 capsule  3  . Fluticasone-Salmeterol (ADVAIR DISKUS) 100-50 MCG/DOSE AEPB Inhale 1 puff into the lungs every 12 (twelve) hours.  180 each  3  . HYDROcodone-acetaminophen (NORCO) 10-325 MG per tablet Take 0.5-1 tablets by mouth every 8 (eight) hours as needed for pain.  30 tablet  3  . levothyroxine (LEVOTHROID) 25 MCG tablet Take 1 tablet (25 mcg total) by mouth daily.  90 tablet  0  . losartan (COZAAR) 100 MG tablet Take 1 tablet (100 mg total) by mouth daily.  90 tablet  0  .  meloxicam (MOBIC) 7.5 MG tablet Take 1 tablet (7.5 mg total) by mouth daily.  90 tablet  0  . montelukast (SINGULAIR) 10 MG tablet Take 1 tablet (10 mg total) by mouth at bedtime.  90 tablet  0  . potassium chloride (K-DUR) 10 MEQ tablet Take 1 tablet (10 mEq total) by mouth 2 (two) times daily.  60 tablet  2  . zolpidem (AMBIEN) 5 MG tablet Take 1 tablet (5 mg total) by mouth at bedtime as needed.  90 tablet  0   No current facility-administered medications for this visit.    Discontinued Meds:   There are no discontinued medications.  Patient Active Problem List  Diagnosis  . HYPERLIPIDEMIA  . DEPRESSION  . HYPERTENSION  . ALLERGIC RHINITIS  . COPD  . GERD  . DIVERTICULOSIS OF COLON  . BREAST CANCER, HX OF  . RLS (restless legs syndrome)  . Anxiety  . Hypothyroidism  . Chest pain    LABS    Component Value Date/Time   NA 142 09/06/2012 1341   NA 140 08/02/2012 0958   NA 140 04/07/2012 1003   K 4.5 09/06/2012  1341   K 3.3* 08/02/2012 0958   K 3.4* 04/07/2012 1003   CL 104 09/06/2012 1341   CL 107 08/02/2012 0958   CL 105 04/07/2012 1003   CO2 26 09/06/2012 1341   CO2 26 08/02/2012 0958   CO2 25 04/07/2012 1003   GLUCOSE 83 09/06/2012 1341   GLUCOSE 87 08/02/2012 0958   GLUCOSE 80 04/07/2012 1003   BUN 13 09/06/2012 1341   BUN 12 08/02/2012 0958   BUN 15 04/07/2012 1003   CREATININE 0.73 09/06/2012 1341   CREATININE 0.7 08/02/2012 0958   CREATININE 0.8 04/07/2012 1003   CREATININE 0.8 01/28/2012 0940   CALCIUM 9.4 09/06/2012 1341   CALCIUM 9.0 08/02/2012 0958   CALCIUM 9.0 04/07/2012 1003   GFRNONAA 87* 07/14/2011 1517   GFRNONAA >60 10/15/2010 1349   GFRNONAA 81.01 04/03/2010 1207   GFRAA >90 07/14/2011 1517   GFRAA  Value: >60        The eGFR has been calculated using the MDRD equation. This calculation has not been validated in all clinical situations. eGFR's persistently <60 mL/min signify possible Chronic Kidney Disease. 10/15/2010 1349   GFRAA 108 08/19/2008 1013   CMP      Component Value Date/Time   NA 142 09/06/2012 1341   K 4.5 09/06/2012 1341   CL 104 09/06/2012 1341   CO2 26 09/06/2012 1341   GLUCOSE 83 09/06/2012 1341   BUN 13 09/06/2012 1341   CREATININE 0.73 09/06/2012 1341   CREATININE 0.7 08/02/2012 0958   CALCIUM 9.4 09/06/2012 1341   PROT 7.0 08/02/2012 0958   ALBUMIN 4.1 08/02/2012 0958   AST 28 08/02/2012 0958   ALT 39* 08/02/2012 0958   ALKPHOS 133* 08/02/2012 0958   BILITOT 0.6 08/02/2012 0958   GFRNONAA 87* 07/14/2011 1517   GFRAA >90 07/14/2011 1517       Component Value Date/Time   WBC 6.1 09/06/2012 1341   WBC 5.8 04/07/2012 1003   WBC 5.0 07/14/2011 1517   HGB 13.7 09/06/2012 1341   HGB 14.5 04/07/2012 1003   HGB 13.2 07/14/2011 1517   HCT 40.0 09/06/2012 1341   HCT 42.7 04/07/2012 1003   HCT 37.8 07/14/2011 1517   MCV 86.8 09/06/2012 1341   MCV 88.6 04/07/2012 1003   MCV 86.9 07/14/2011 1517    Lipid Panel     Component Value Date/Time   CHOL 259* 08/02/2012 0958   TRIG 293.0* 08/02/2012 0958   HDL 44.70 08/02/2012 0958   CHOLHDL 6 08/02/2012 0958   VLDL 58.6* 08/02/2012 0958   LDLCALC 111* 05/13/2010 1042    ABG No results found for this basename: phart, pco2, pco2art, po2, po2art, hco3, tco2, acidbasedef, o2sat     Lab Results  Component Value Date   TSH 1.48 08/02/2012   BNP (last 3 results) No results found for this basename: PROBNP,  in the last 8760 hours Cardiac Panel (last 3 results) No results found for this basename: CKTOTAL, CKMB, TROPONINI, RELINDX,  in the last 72 hours  Iron/TIBC/Ferritin No results found for this basename: iron, tibc, ferritin     EKG Orders placed in visit on 10/05/12  . EKG 12-LEAD     Prior Assessment and Plan Problem List as of 10/05/2012     ICD-9-CM     Cardiology Problems   HYPERLIPIDEMIA   Last Assessment & Plan   09/06/2012 Office Visit Written 09/06/2012 12:05 PM by Wendall Stade, MD      Cholesterol is at goal.  Continue  current dose of statin and diet Rx.  No myalgias or side effects.   F/U  LFT's in 6 months. Lab Results  Component Value Date   [place] 111* 05/13/2010                HYPERTENSION   Last Assessment & Plan   09/06/2012 Office Visit Written 09/06/2012 12:04 PM by Wendall Stade, MD     Well controlled.  Continue current medications and low sodium Dash type diet.         Other   DEPRESSION   ALLERGIC RHINITIS   COPD   GERD   DIVERTICULOSIS OF COLON   BREAST CANCER, HX OF   Last Assessment & Plan   07/30/2011 Office Visit Written 07/30/2011  9:40 AM by Lindley Magnus, MD     Given hx of breast cancer and new onset thoracic pain I'll check an xray    RLS (restless legs syndrome)   Last Assessment & Plan   12/29/2010 Office Visit Written 12/29/2010 11:21 AM by Lindley Magnus, MD     Reviewed sleep study Start mirapex --side effects discussed    Anxiety   Hypothyroidism   Chest pain   Last Assessment & Plan   09/06/2012 Office Visit Written 09/06/2012 12:07 PM by Wendall Stade, MD     Progressive with some classic features of squeezing pressure brought on by both mental and physical stress.  Relief with nitro.  Discussed options and cath indicated. She has had breast CA and probably should be right femoral approach.  Will set up for Dr Shirlee Latch next week and I have no cath days in the next 10 days.  CXR and labs today Risks discussed and willing to proceed. She just wants to have versed/valium for case        Imaging: Dg Chest 2 View  09/06/2012  *RADIOLOGY REPORT*  Clinical Data: Chest pain.  CHEST - 2 VIEW  Comparison: 07/30/2011  Findings: The heart size and pulmonary vascularity are normal. Slight scarring in the right lung apex, stable. Minimal scarring in the left upper lobe.  Lungs are otherwise clear.  No acute abnormalities.  IMPRESSION: No acute abnormalities.   Original Report Authenticated By: Francene Boyers, M.D.

## 2012-10-05 NOTE — Patient Instructions (Addendum)
Your physician recommends that you continue on your current medications as directed. Please refer to the Current Medication list given to you today. Your physician recommends that you schedule a follow-up appointment in: 1 year with [facility]
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
## 2012-10-05 NOTE — Progress Notes (Signed)
HPI Gina Weber is a 67 year old patient of Gina Weber we are following for ongoing assessment and management of hypertension. The patient has a history of hypothyroidism anxiety GERD and osteoarthritis. She is here post cardiac catheterization secondary to episodes of chest pain which was completed on 09/14/2012. The patient was found to have no angiographic evidence of CAD with normal LV function chest pain was found to be noncardiac she was continued on current medication regimen.       She continues to complaint of back pain and mild chest discomfort.  She has DDD, with chronic arthritis. She has had some palpitations after an anxiety filled episode with her dog. No other episodes since that time.  Allergies  Allergen Reactions  . Pravastatin Sodium     REACTION: liver enzyme abnormality---per pt's report  . Statins     REACTION: leg pain, elevates liver enzymes  . Sulfamethoxazole     REACTION: hives    Current Outpatient Prescriptions  Medication Sig Dispense Refill  . albuterol (PROAIR HFA) 108 (90 BASE) MCG/ACT inhaler Inhale 2 puffs into the lungs every 6 (six) hours as needed for wheezing.  18 g  5  . ALPRAZolam (XANAX) 0.5 MG tablet Take 1 tablet (0.5 mg total) by mouth at bedtime as needed. For sleep  90 tablet  0  . amitriptyline (ELAVIL) 10 MG tablet Take 20 mg by mouth at bedtime.      Marland Kitchen amLODipine (NORVASC) 10 MG tablet Take 1 tablet (10 mg total) by mouth daily.  90 tablet  0  . DULoxetine (CYMBALTA) 30 MG capsule Take 1 capsule (30 mg total) by mouth daily.  30 capsule  0  . esomeprazole (NEXIUM) 40 MG capsule Take 1 capsule (40 mg total) by mouth daily.  90 capsule  3  . Fluticasone-Salmeterol (ADVAIR DISKUS) 100-50 MCG/DOSE AEPB Inhale 1 puff into the lungs every 12 (twelve) hours.  180 each  3  . HYDROcodone-acetaminophen (NORCO) 10-325 MG per tablet Take 0.5-1 tablets by mouth every 8 (eight) hours as needed for pain.  30 tablet  3  . levothyroxine (LEVOTHROID)  25 MCG tablet Take 1 tablet (25 mcg total) by mouth daily.  90 tablet  0  . losartan (COZAAR) 100 MG tablet Take 1 tablet (100 mg total) by mouth daily.  90 tablet  0  . meloxicam (MOBIC) 7.5 MG tablet Take 1 tablet (7.5 mg total) by mouth daily.  90 tablet  0  . montelukast (SINGULAIR) 10 MG tablet Take 1 tablet (10 mg total) by mouth at bedtime.  90 tablet  0  . potassium chloride (K-DUR) 10 MEQ tablet Take 1 tablet (10 mEq total) by mouth 2 (two) times daily.  60 tablet  2  . zolpidem (AMBIEN) 5 MG tablet Take 1 tablet (5 mg total) by mouth at bedtime as needed.  90 tablet  0   No current facility-administered medications for this visit.    Past Medical History  Diagnosis Date  . Fibromyalgia   . Arthritis   . Allergy   . Hyperlipidemia   . Hypertension   . GERD (gastroesophageal reflux disease)   . Depression   . COPD (chronic obstructive pulmonary disease)   . Anxiety   . Diverticulosis   . Breast cancer 1987 & 2006    radiation therapy    Past Surgical History  Procedure Laterality Date  . Abdominal hysterectomy    . Mastectomy  1987    bilateral f/u reconstruction  .  Cataract extraction      left eye  . Appendectomy      AVW:[id number] of systems complete and found to be negative unless listed above PHYSICAL EXAM BP 138/78  Pulse 85  Ht 5\' 5"  (1.651 m)  Wt 163 lb (73.936 kg)  BMI 27.12 kg/m2  SpO2 98% \General: Well developed, well nourished, in no acute distress Head: Eyes PERRLA, No xanthomas.   Normal cephalic and atramatic  Lungs: Clear bilaterally to auscultation and percussion. Heart: HRRR S1 S2, without MRG.  Pulses are 2+ & equal.            No carotid bruit. No JVD.  No abdominal bruits. No femoral bruits. Abdomen: Bowel sounds are positive, abdomen soft and non-tender without masses or                  Hernia's noted. Msk:  Back normal, normal gait. Normal strength and tone for age. Extremities: No clubbing, cyanosis or edema.  DP +1 Neuro: Alert and  oriented X 3. Psych:  Good affect, responds appropriately   ASSESSMENT AND PLAN

## 2012-10-05 NOTE — Assessment & Plan Note (Signed)
Well controlled on current medication. No changes.

## 2012-10-09 ENCOUNTER — Encounter: Payer: Self-pay | Admitting: Family

## 2012-10-10 ENCOUNTER — Other Ambulatory Visit: Payer: Self-pay

## 2012-10-10 MED ORDER — LEVOTHYROXINE SODIUM 25 MCG PO TABS: 25.0000 ug | ORAL_TABLET | Freq: Every day | ORAL | Status: DC

## 2012-10-10 MED ORDER — DULOXETINE HCL 30 MG PO CPEP: 60.0000 mg | ORAL_CAPSULE | Freq: Every day | ORAL | Status: DC

## 2012-10-10 MED ORDER — LOSARTAN POTASSIUM 100 MG PO TABS: 100.0000 mg | ORAL_TABLET | Freq: Every day | ORAL | Status: DC

## 2012-10-10 MED ORDER — MONTELUKAST SODIUM 10 MG PO TABS: 10.0000 mg | ORAL_TABLET | Freq: Every day | ORAL | Status: DC

## 2012-10-10 MED ORDER — AMLODIPINE BESYLATE 10 MG PO TABS: 10.0000 mg | ORAL_TABLET | Freq: Every day | ORAL | Status: DC

## 2012-10-30 ENCOUNTER — Other Ambulatory Visit: Payer: Self-pay

## 2012-10-30 MED ORDER — MELOXICAM 7.5 MG PO TABS: 7.5000 mg | ORAL_TABLET | Freq: Every day | ORAL | Status: DC

## 2012-10-30 NOTE — Telephone Encounter (Signed)
Rx sent 

## 2012-11-21 ENCOUNTER — Other Ambulatory Visit: Payer: Self-pay

## 2012-11-21 MED ORDER — MELOXICAM 7.5 MG PO TABS: 7.5000 mg | ORAL_TABLET | Freq: Every day | ORAL | Status: DC

## 2012-11-21 MED ORDER — AMITRIPTYLINE HCL 10 MG PO TABS: 20.0000 mg | ORAL_TABLET | Freq: Every day | ORAL | Status: DC

## 2012-11-22 ENCOUNTER — Ambulatory Visit: Payer: Self-pay | Admitting: Family

## 2013-01-04 ENCOUNTER — Ambulatory Visit: Payer: Self-pay | Admitting: Family

## 2013-01-11 ENCOUNTER — Other Ambulatory Visit: Payer: Self-pay

## 2013-01-11 IMAGING — CR DG CHEST 1V PORT
1 series · 1 of 1 positions shown · non-contrast
Comparison: Portable exam 3035 hours compared to 06/09/2007

CLINICAL DATA: Chest pain, shortness of breath

PORTABLE CHEST - 1 VIEW

[view not recorded]
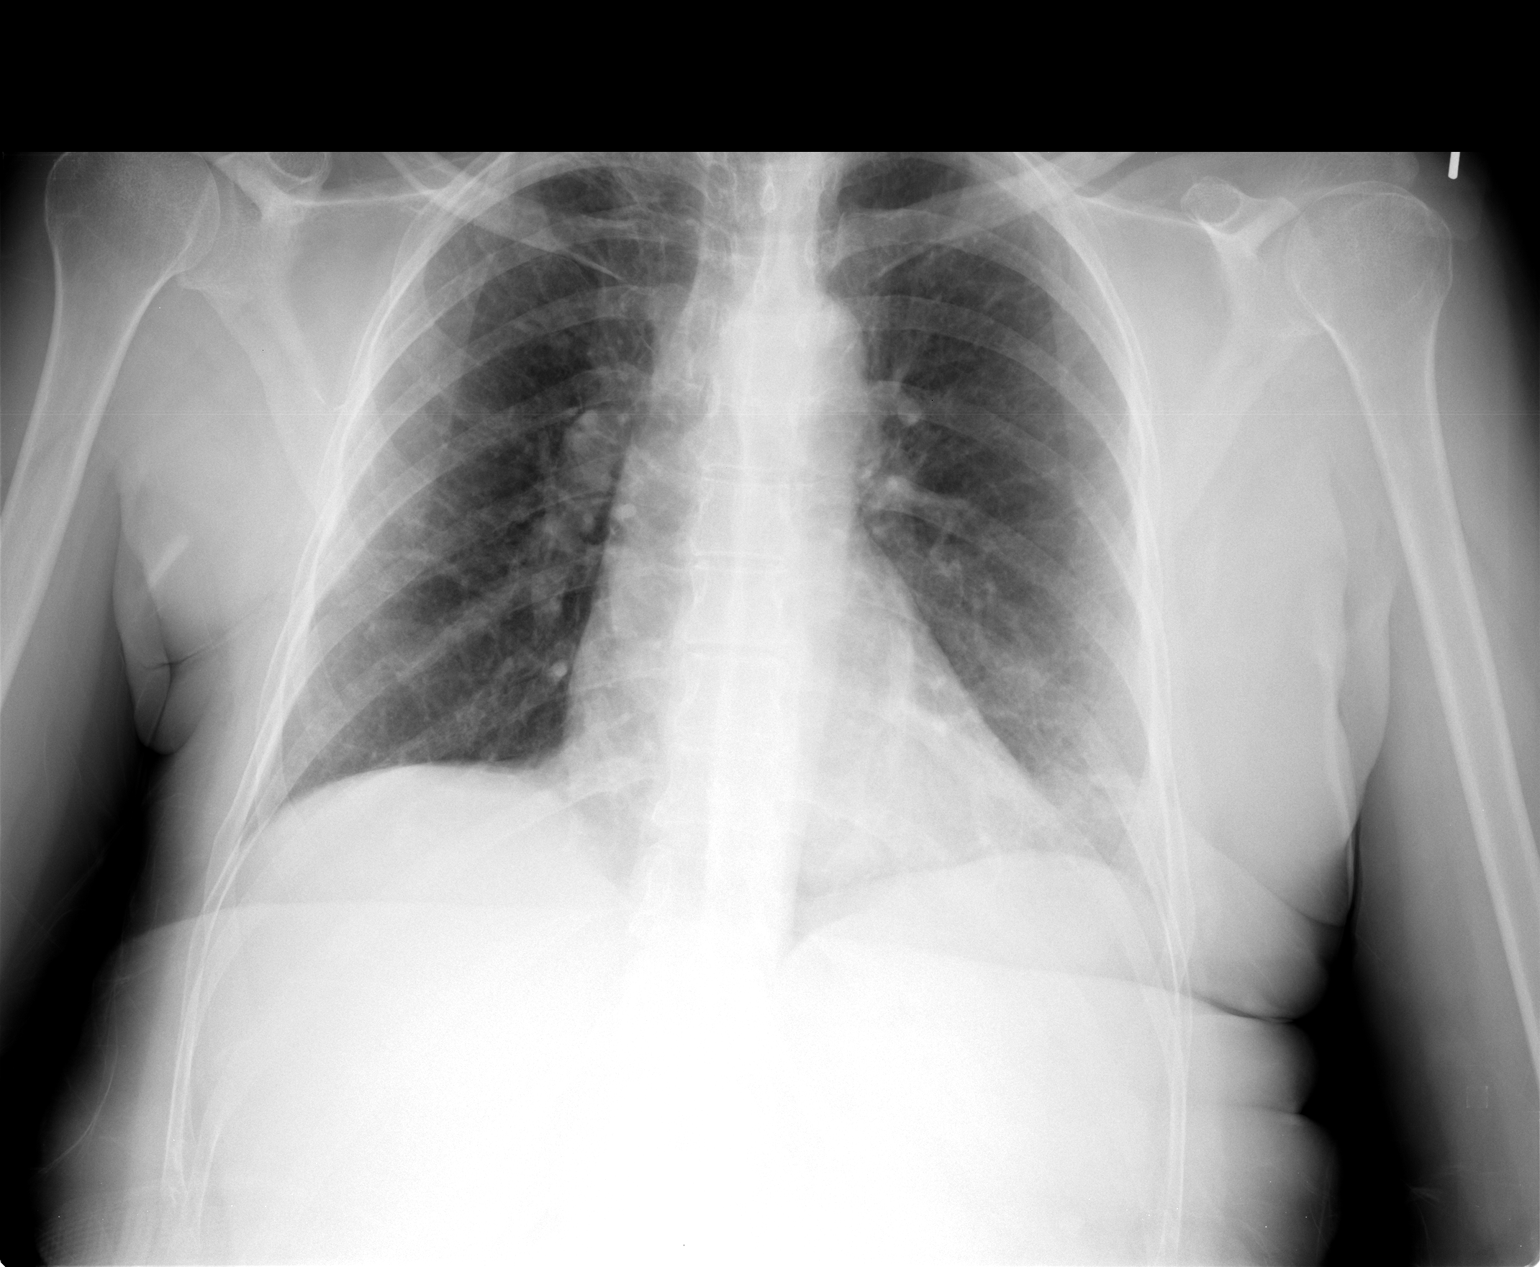

[1 of 1 positions shown; findings below may reference images not displayed]

FINDINGS: Normal heart size, mediastinal contours, and pulmonary vascularity.
Chronic infiltrate versus scarring right apex, not significantly
changed.
Minimal infiltrate versus atelectasis lateral left lung base.
Lungs otherwise clear.
No pleural effusion or pneumothorax.
Bones demineralized.
IMPRESSION: Minimal infiltrate versus atelectasis at lateral left lung base.
Chronic infiltrate versus scarring right apex, not significantly
changed.

## 2013-01-11 MED ORDER — DULOXETINE HCL 30 MG PO CPEP: 60.0000 mg | ORAL_CAPSULE | Freq: Every day | ORAL | Status: DC

## 2013-01-17 ENCOUNTER — Ambulatory Visit (INDEPENDENT_AMBULATORY_CARE_PROVIDER_SITE_OTHER): Payer: Medicare Other | Admitting: Family

## 2013-01-17 ENCOUNTER — Encounter: Payer: Self-pay | Admitting: Family

## 2013-01-17 VITALS — BP 132/60 | HR 65 | Wt 160.0 lb

## 2013-01-17 DIAGNOSIS — F3130 Bipolar disorder, current episode depressed, mild or moderate severity, unspecified: Secondary | ICD-10-CM

## 2013-01-17 DIAGNOSIS — I10 Essential (primary) hypertension: Secondary | ICD-10-CM

## 2013-01-17 DIAGNOSIS — E7800 Pure hypercholesterolemia, unspecified: Secondary | ICD-10-CM

## 2013-01-17 LAB — LIPID PANEL
Total CHOL/HDL Ratio: 5
VLDL: 36 mg/dL (ref 0.0–40.0)

## 2013-01-17 LAB — LDL CHOLESTEROL, DIRECT: Direct LDL: 196 mg/dL

## 2013-01-17 MED ORDER — AMITRIPTYLINE HCL 10 MG PO TABS: 10.0000 mg | ORAL_TABLET | Freq: Every day | ORAL | Status: DC

## 2013-01-17 MED ORDER — ALPRAZOLAM 0.5 MG PO TABS: 0.5000 mg | ORAL_TABLET | Freq: Every evening | ORAL | Status: DC | PRN

## 2013-01-17 MED ORDER — QUETIAPINE FUMARATE 50 MG PO TABS: 50.0000 mg | ORAL_TABLET | Freq: Every day | ORAL | Status: DC

## 2013-01-17 NOTE — Patient Instructions (Addendum)
Follow-up in 3-4 weeks.

## 2013-01-17 NOTE — Progress Notes (Signed)
Subjective:    Patient ID: Gina Weber, female    DOB: Nov 16, 1945, 67 y.o.   MRN: [id number]  HPI  Pt is a 67 year old white female who presents to PCP with multiple issues including night sweats, dry mouth, loss of appetite, diarrhea and fatigue. Denies any abdominal pain. Pt reports she has been under increased stress due to family dynamic issues. Reports having mood swings of anger and depression, denies any current suicidal ideations.   Review of Systems  Constitutional: Positive for appetite change.  HENT: Negative.   Eyes: Negative.   Respiratory: Negative.   Cardiovascular: Negative.   Gastrointestinal: Positive for diarrhea.  Endocrine: Negative.   Genitourinary: Negative.   Musculoskeletal: Negative.   Skin: Negative.   Allergic/Immunologic: Negative.   Neurological: Negative.   Hematological: Negative.   Psychiatric/Behavioral: Positive for agitation.   Past Medical History  Diagnosis Date  . Fibromyalgia   . Arthritis   . Allergy   . Hyperlipidemia   . Hypertension   . GERD (gastroesophageal reflux disease)   . Depression   . COPD (chronic obstructive pulmonary disease)   . Anxiety   . Diverticulosis   . Breast cancer 1987 & 2006    radiation therapy    History   Social History  . Marital Status: Married    Spouse Name: N/A    Number of Children: N/A  . Years of Education: N/A   Occupational History  . Not on file.   Social History Main Topics  . Smoking status: Never Smoker   . Smokeless tobacco: Never Used  . Alcohol Use: No  . Drug Use: No  . Sexually Active:    Other Topics Concern  . Not on file   Social History Narrative  . No narrative on file    Past Surgical History  Procedure Laterality Date  . Abdominal hysterectomy    . Mastectomy  1987    bilateral f/u reconstruction  . Cataract extraction      left eye  . Appendectomy      Family History  Problem Relation Age of Onset  . Cancer Mother 19    esophageal   . Aneurysm  Father     AAA  . Heart disease Father   . Depression Son     bipolar  . Colon cancer Neg Hx     Allergies  Allergen Reactions  . Pravastatin Sodium     REACTION: liver enzyme abnormality---per pt's report  . Statins     REACTION: leg pain, elevates liver enzymes  . Sulfamethoxazole     REACTION: hives    Current Outpatient Prescriptions on File Prior to Visit  Medication Sig Dispense Refill  . albuterol (PROAIR HFA) 108 (90 BASE) MCG/ACT inhaler Inhale 2 puffs into the lungs every 6 (six) hours as needed for wheezing.  18 g  5  . amLODipine (NORVASC) 10 MG tablet Take 1 tablet (10 mg total) by mouth daily.  90 tablet  1  . DULoxetine (CYMBALTA) 30 MG capsule Take 2 capsules (60 mg total) by mouth daily.  60 capsule  3  . esomeprazole (NEXIUM) 40 MG capsule Take 1 capsule (40 mg total) by mouth daily.  90 capsule  3  . Fluticasone-Salmeterol (ADVAIR DISKUS) 100-50 MCG/DOSE AEPB Inhale 1 puff into the lungs every 12 (twelve) hours.  180 each  3  . HYDROcodone-acetaminophen (NORCO) 10-325 MG per tablet Take 0.5-1 tablets by mouth every 8 (eight) hours as needed for pain.  30  tablet  3  . levothyroxine (LEVOTHROID) 25 MCG tablet Take 1 tablet (25 mcg total) by mouth daily.  90 tablet  1  . losartan (COZAAR) 100 MG tablet Take 1 tablet (100 mg total) by mouth daily.  90 tablet  1  . meloxicam (MOBIC) 7.5 MG tablet Take 1 tablet (7.5 mg total) by mouth daily.  90 tablet  0  . montelukast (SINGULAIR) 10 MG tablet Take 1 tablet (10 mg total) by mouth at bedtime.  90 tablet  1  . potassium chloride (K-DUR) 10 MEQ tablet Take 1 tablet (10 mEq total) by mouth 2 (two) times daily.  60 tablet  2  . zolpidem (AMBIEN) 5 MG tablet Take 1 tablet (5 mg total) by mouth at bedtime as needed.  90 tablet  0   No current facility-administered medications on file prior to visit.    BP 132/60  Pulse 65  Wt 160 lb (72.576 kg)  BMI 26.63 kg/m2  SpO2 96%chart     Objective:   Physical Exam   Constitutional: She is oriented to person, place, and time. She appears well-developed and well-nourished.  HENT:  Head: Normocephalic and atraumatic.  Eyes: Conjunctivae are normal. Pupils are equal, round, and reactive to light.  Neck: Normal range of motion.  Cardiovascular: Normal rate and regular rhythm.   Pulmonary/Chest: Effort normal and breath sounds normal.  Abdominal: Soft. Bowel sounds are normal.  Musculoskeletal: Normal range of motion.  Neurological: She is alert and oriented to person, place, and time.  Skin: Skin is warm and dry.          Assessment & Plan:  1. Dyslipidemia 2. Bipolar Depression 3. Hypercholesterolemia  Lipid panel ordered. Pt will start to wean off elavil by reducing to 10mg /day. Pt started on Seroquel 50mg   to help stabilize mood. Pt will follow up with PCP in 3-4 weeks or sooner if symptoms worsen/persist or with any questions or concerns.  Note by Davonna Belling, FNP Student

## 2013-01-18 ENCOUNTER — Telehealth: Payer: Self-pay

## 2013-01-18 MED ORDER — QUETIAPINE FUMARATE 50 MG PO TABS: 50.0000 mg | ORAL_TABLET | Freq: Every day | ORAL | Status: DC

## 2013-01-18 MED ORDER — PITAVASTATIN CALCIUM 2 MG PO TABS: 2.0000 mg | ORAL_TABLET | Freq: Every day | ORAL | Status: DC

## 2013-01-18 NOTE — Telephone Encounter (Signed)
Message copied by Beverely Low on Thu Jan 18, 2013  9:22 AM ------      Message from: Adline Mango B      Created: Wed Jan 17, 2013  4:48 PM       Definitely needs cholesterol meds. I know she has tried meds before and had aching. Lets try Livalo that has less likelihood of myalgias. ------

## 2013-01-18 NOTE — Telephone Encounter (Signed)
Pt aware of results and meds sent to pharmacy

## 2013-01-19 ENCOUNTER — Other Ambulatory Visit: Payer: Self-pay

## 2013-01-19 MED ORDER — POTASSIUM CHLORIDE ER 10 MEQ PO TBCR: 10.0000 meq | EXTENDED_RELEASE_TABLET | Freq: Two times a day (BID) | ORAL | Status: DC

## 2013-01-30 ENCOUNTER — Telehealth: Payer: Self-pay | Admitting: Family

## 2013-01-30 MED ORDER — AMITRIPTYLINE HCL 10 MG PO TABS: 10.0000 mg | ORAL_TABLET | Freq: Every day | ORAL | Status: DC

## 2013-01-30 NOTE — Telephone Encounter (Signed)
Clarified with pharmacy and quanity changed

## 2013-01-30 NOTE — Telephone Encounter (Signed)
Pharmacist called to corfirm that you did decrease the pt's dosage from 2 tablets at night to 1 tablet at night of this amitriptyline (ELAVIL) 10 MG tablet. Please assist.

## 2013-01-31 ENCOUNTER — Other Ambulatory Visit: Payer: Self-pay

## 2013-02-07 ENCOUNTER — Encounter: Payer: Self-pay | Admitting: Family

## 2013-02-07 ENCOUNTER — Ambulatory Visit (INDEPENDENT_AMBULATORY_CARE_PROVIDER_SITE_OTHER): Payer: Medicare Other | Admitting: Family

## 2013-02-07 VITALS — BP 116/62 | HR 102 | Wt 165.0 lb

## 2013-02-07 DIAGNOSIS — F3289 Other specified depressive episodes: Secondary | ICD-10-CM

## 2013-02-07 DIAGNOSIS — E7800 Pure hypercholesterolemia, unspecified: Secondary | ICD-10-CM

## 2013-02-07 DIAGNOSIS — F329 Major depressive disorder, single episode, unspecified: Secondary | ICD-10-CM

## 2013-02-07 DIAGNOSIS — J4489 Other specified chronic obstructive pulmonary disease: Secondary | ICD-10-CM

## 2013-02-07 DIAGNOSIS — J449 Chronic obstructive pulmonary disease, unspecified: Secondary | ICD-10-CM

## 2013-02-07 DIAGNOSIS — F32A Depression, unspecified: Secondary | ICD-10-CM

## 2013-02-07 MED ORDER — AMITRIPTYLINE HCL 10 MG PO TABS: 20.0000 mg | ORAL_TABLET | Freq: Every day | ORAL | Status: DC

## 2013-02-07 NOTE — Progress Notes (Signed)
Subjective:    Patient ID: Gina Weber, female    DOB: 12-15-1945, 66 y.o.   MRN: [id number]  HPI  67 year old white female, nonsmoker is in for recheck on depression. At her last office visit we initiated Seroquel 50 mg once daily. Patient reports she took one dose, had a nightmare and was unable to wake up. Reports that her husband and son waking her up out of her nightmare. She believes that the medication was the culprit. However, her mood has been stable recently. Denies any feelings of helplessness, hopelessness, thoughts of death or dying. At her last office visit, she was also started on Livalo for hyperlipidemia. She is tolerating the medication well. Denies any concerns.  Review of Systems  Constitutional: Negative.   HENT: Negative.   Respiratory: Negative.   Cardiovascular: Negative.   Gastrointestinal: Negative.   Endocrine: Negative.   Genitourinary: Negative.   Musculoskeletal: Negative.   Skin: Negative.   Allergic/Immunologic: Negative.   Neurological: Negative.   Hematological: Negative.   Psychiatric/Behavioral: Negative.    Past Medical History  Diagnosis Date  . Fibromyalgia   . Arthritis   . Allergy   . Hyperlipidemia   . Hypertension   . GERD (gastroesophageal reflux disease)   . Depression   . COPD (chronic obstructive pulmonary disease)   . Anxiety   . Diverticulosis   . Breast cancer 1987 & 2006    radiation therapy    History   Social History  . Marital Status: Married    Spouse Name: N/A    Number of Children: N/A  . Years of Education: N/A   Occupational History  . Not on file.   Social History Main Topics  . Smoking status: Never Smoker   . Smokeless tobacco: Never Used  . Alcohol Use: No  . Drug Use: No  . Sexual Activity:    Other Topics Concern  . Not on file   Social History Narrative  . No narrative on file    Past Surgical History  Procedure Laterality Date  . Abdominal hysterectomy    . Mastectomy  1987   bilateral f/u reconstruction  . Cataract extraction      left eye  . Appendectomy      Family History  Problem Relation Age of Onset  . Cancer Mother 25    esophageal   . Aneurysm Father     AAA  . Heart disease Father   . Depression Son     bipolar  . Colon cancer Neg Hx     Allergies  Allergen Reactions  . Seroquel [Quetiapine Fumarate] Anaphylaxis  . Pravastatin Sodium     REACTION: liver enzyme abnormality---per pt's report  . Statins     REACTION: leg pain, elevates liver enzymes  . Sulfamethoxazole     REACTION: hives    Current Outpatient Prescriptions on File Prior to Visit  Medication Sig Dispense Refill  . albuterol (PROAIR HFA) 108 (90 BASE) MCG/ACT inhaler Inhale 2 puffs into the lungs every 6 (six) hours as needed for wheezing.  18 g  5  . ALPRAZolam (XANAX) 0.5 MG tablet Take 1 tablet (0.5 mg total) by mouth at bedtime as needed. For sleep  90 tablet  0  . amLODipine (NORVASC) 10 MG tablet Take 1 tablet (10 mg total) by mouth daily.  90 tablet  1  . DULoxetine (CYMBALTA) 30 MG capsule Take 2 capsules (60 mg total) by mouth daily.  60 capsule  3  . esomeprazole (NEXIUM)  40 MG capsule Take 1 capsule (40 mg total) by mouth daily.  90 capsule  3  . Fluticasone-Salmeterol (ADVAIR DISKUS) 100-50 MCG/DOSE AEPB Inhale 1 puff into the lungs every 12 (twelve) hours.  180 each  3  . HYDROcodone-acetaminophen (NORCO) 10-325 MG per tablet Take 0.5-1 tablets by mouth every 8 (eight) hours as needed for pain.  30 tablet  3  . levothyroxine (LEVOTHROID) 25 MCG tablet Take 1 tablet (25 mcg total) by mouth daily.  90 tablet  1  . losartan (COZAAR) 100 MG tablet Take 1 tablet (100 mg total) by mouth daily.  90 tablet  1  . meloxicam (MOBIC) 7.5 MG tablet Take 1 tablet (7.5 mg total) by mouth daily.  90 tablet  0  . montelukast (SINGULAIR) 10 MG tablet Take 1 tablet (10 mg total) by mouth at bedtime.  90 tablet  1  . Pitavastatin Calcium 2 MG TABS Take 1 tablet (2 mg total) by  mouth daily.  90 tablet  0  . potassium chloride (K-DUR) 10 MEQ tablet Take 1 tablet (10 mEq total) by mouth 2 (two) times daily.  60 tablet  2  . zolpidem (AMBIEN) 5 MG tablet Take 1 tablet (5 mg total) by mouth at bedtime as needed.  90 tablet  0   No current facility-administered medications on file prior to visit.    BP 116/62  Pulse 102  Wt 165 lb (74.844 kg)  BMI 27.46 kg/m2  SpO2 98%chart    Objective:   Physical Exam  Constitutional: She is oriented to person, place, and time. She appears well-developed and well-nourished.  HENT:  Right Ear: External ear normal.  Left Ear: External ear normal.  Nose: Nose normal.  Mouth/Throat: Oropharynx is clear and moist.  Neck: Normal range of motion. Neck supple. No thyromegaly present.  Cardiovascular: Normal rate, regular rhythm and normal heart sounds.   Pulmonary/Chest: Effort normal and breath sounds normal.  Abdominal: Soft. Bowel sounds are normal.  Musculoskeletal: Normal range of motion.  Neurological: She is alert and oriented to person, place, and time. She has normal reflexes.  Skin: Skin is warm and dry.  Psychiatric: She has a normal mood and affect.          Assessment & Plan:  Assessment: 1. Depression-stable 2. Hyperlipidemia 3. Hypothyroidism 4. COPD  Plan: Continue current medications. Patient to return in 3 weeks for lab only appointment to check cholesterol and LFTs. Continue amitriptyline 20 mg at bedtime. Exercise daily. Patient upon the office with any questions or concerns. Check is scheduled, in 3 months, and sooner as needed.

## 2013-02-08 ENCOUNTER — Ambulatory Visit: Payer: Self-pay | Admitting: Family

## 2013-03-01 ENCOUNTER — Other Ambulatory Visit: Payer: Self-pay

## 2013-03-02 ENCOUNTER — Encounter: Payer: Self-pay | Admitting: Family

## 2013-03-02 ENCOUNTER — Ambulatory Visit (INDEPENDENT_AMBULATORY_CARE_PROVIDER_SITE_OTHER): Payer: Medicare Other | Admitting: Family

## 2013-03-02 ENCOUNTER — Other Ambulatory Visit: Payer: Self-pay

## 2013-03-02 VITALS — BP 118/66 | HR 85 | Wt 162.0 lb

## 2013-03-02 DIAGNOSIS — M549 Dorsalgia, unspecified: Secondary | ICD-10-CM

## 2013-03-02 DIAGNOSIS — I10 Essential (primary) hypertension: Secondary | ICD-10-CM

## 2013-03-02 DIAGNOSIS — E7800 Pure hypercholesterolemia, unspecified: Secondary | ICD-10-CM

## 2013-03-02 LAB — HEPATIC FUNCTION PANEL
Alkaline Phosphatase: 105 U/L (ref 39–117)
Bilirubin, Direct: 0.1 mg/dL (ref 0.0–0.3)

## 2013-03-02 LAB — POCT URINALYSIS DIPSTICK
Bilirubin, UA: NEGATIVE
Blood, UA: NEGATIVE
Glucose, UA: NEGATIVE
Nitrite, UA: NEGATIVE
Spec Grav, UA: 1.015

## 2013-03-02 LAB — LIPID PANEL
Total CHOL/HDL Ratio: 5
VLDL: 46.8 mg/dL — ABNORMAL HIGH (ref 0.0–40.0)

## 2013-03-02 LAB — LDL CHOLESTEROL, DIRECT: Direct LDL: 181.8 mg/dL

## 2013-03-02 NOTE — Patient Instructions (Addendum)
Muscle Strain  Muscle strain occurs when a muscle is stretched beyond its normal length. A small number of muscle fibers generally are torn. This is especially common in athletes. This happens when a sudden, violent force placed on a muscle stretches it too far. Usually, recovery from muscle strain takes 1 to 2 weeks. Complete healing will take 5 to 6 weeks.   HOME CARE INSTRUCTIONS    While awake, apply ice to the sore muscle for the first 2 days after the injury.   Put ice in a plastic bag.   Place a towel between your skin and the bag.   Leave the ice on for 15-20 minutes each hour.   Do not use the strained muscle for several days, until you no longer have pain.   You may wrap the injured area with an elastic bandage for comfort. Be careful not to wrap it too tightly. This may interfere with blood circulation or increase swelling.   Only take over-the-counter or prescription medicines for pain, discomfort, or fever as directed by your caregiver.  SEEK MEDICAL CARE IF:   You have increasing pain or swelling in the injured area.  MAKE SURE YOU:    Understand these instructions.   Will watch your condition.   Will get help right away if you are not doing well or get worse.  Document Released: 06/14/2005 Document Revised: 09/06/2011 Document Reviewed: 06/26/2011  [employer] Patient Information 2014 [employer]

## 2013-03-02 NOTE — Progress Notes (Signed)
Subjective:    Patient ID: Gina Weber, female    DOB: [birth year], 67 y.o.   MRN: [id number]  HPI 67 year old white female, nonsmoker is sent today with low back pain that occurred yesterday lasting one day. She reports using a heating pad and the pain has resolved completely today. Denies any urinary frequency, urgency, but in her ear and her foul-smelling urine. No heavy lifting or bending.   She also has a history of hypercholesterolemia and is here for cholesterol check today. Follow a healthy diet and not exercising.   Review of Systems  Constitutional: Negative.   HENT: Negative.   Respiratory: Negative.   Cardiovascular: Negative.   Gastrointestinal: Negative.   Endocrine: Negative.   Musculoskeletal: Positive for back pain.       Resolved  Skin: Negative.   Neurological: Negative.   Hematological: Negative.   Psychiatric/Behavioral: Negative.    Past Medical History  Diagnosis Date  . Fibromyalgia   . Arthritis   . Allergy   . Hyperlipidemia   . Hypertension   . GERD (gastroesophageal reflux disease)   . Depression   . COPD (chronic obstructive pulmonary disease)   . Anxiety   . Diverticulosis   . Breast cancer 1987 & 2006    radiation therapy    History   Social History  . Marital Status: Married    Spouse Name: N/A    Number of Children: N/A  . Years of Education: N/A   Occupational History  . Not on file.   Social History Main Topics  . Smoking status: Never Smoker   . Smokeless tobacco: Never Used  . Alcohol Use: No  . Drug Use: No  . Sexual Activity:    Other Topics Concern  . Not on file   Social History Narrative  . No narrative on file    Past Surgical History  Procedure Laterality Date  . Abdominal hysterectomy    . Mastectomy  1987    bilateral f/u reconstruction  . Cataract extraction      left eye  . Appendectomy      Family History  Problem Relation Age of Onset  . Cancer Mother 71    esophageal   . Aneurysm Father      AAA  . Heart disease Father   . Depression Son     bipolar  . Colon cancer Neg Hx     Allergies  Allergen Reactions  . Seroquel [Quetiapine Fumarate] Anaphylaxis  . Pravastatin Sodium     REACTION: liver enzyme abnormality---per pt's report  . Statins     REACTION: leg pain, elevates liver enzymes  . Sulfamethoxazole     REACTION: hives    Current Outpatient Prescriptions on File Prior to Visit  Medication Sig Dispense Refill  . ALPRAZolam (XANAX) 0.5 MG tablet Take 1 tablet (0.5 mg total) by mouth at bedtime as needed. For sleep  90 tablet  0  . amitriptyline (ELAVIL) 10 MG tablet Take 2 tablets (20 mg total) by mouth at bedtime.  180 tablet  0  . amLODipine (NORVASC) 10 MG tablet Take 1 tablet (10 mg total) by mouth daily.  90 tablet  1  . DULoxetine (CYMBALTA) 30 MG capsule Take 2 capsules (60 mg total) by mouth daily.  60 capsule  3  . esomeprazole (NEXIUM) 40 MG capsule Take 1 capsule (40 mg total) by mouth daily.  90 capsule  3  . Fluticasone-Salmeterol (ADVAIR DISKUS) 100-50 MCG/DOSE AEPB Inhale 1 puff into the lungs  every 12 (twelve) hours.  180 each  3  . HYDROcodone-acetaminophen (NORCO) 10-325 MG per tablet Take 0.5-1 tablets by mouth every 8 (eight) hours as needed for pain.  30 tablet  3  . levothyroxine (LEVOTHROID) 25 MCG tablet Take 1 tablet (25 mcg total) by mouth daily.  90 tablet  1  . losartan (COZAAR) 100 MG tablet Take 1 tablet (100 mg total) by mouth daily.  90 tablet  1  . meloxicam (MOBIC) 7.5 MG tablet Take 1 tablet (7.5 mg total) by mouth daily.  90 tablet  0  . montelukast (SINGULAIR) 10 MG tablet Take 1 tablet (10 mg total) by mouth at bedtime.  90 tablet  1  . Pitavastatin Calcium 2 MG TABS Take 1 tablet (2 mg total) by mouth daily.  90 tablet  0  . potassium chloride (K-DUR) 10 MEQ tablet Take 1 tablet (10 mEq total) by mouth 2 (two) times daily.  60 tablet  2  . zolpidem (AMBIEN) 5 MG tablet Take 1 tablet (5 mg total) by mouth at bedtime as needed.   90 tablet  0  . albuterol (PROAIR HFA) 108 (90 BASE) MCG/ACT inhaler Inhale 2 puffs into the lungs every 6 (six) hours as needed for wheezing.  18 g  5   No current facility-administered medications on file prior to visit.    BP 118/66  Pulse 85  Wt 162 lb (73.483 kg)  BMI 26.96 kg/m2chart    Objective:   Physical Exam  Constitutional: She appears well-developed and well-nourished.  HENT:  Right Ear: External ear normal.  Left Ear: External ear normal.  Nose: Nose normal.  Mouth/Throat: Oropharynx is clear and moist.  Neck: Normal range of motion. Neck supple.  Cardiovascular: Normal rate, regular rhythm and normal heart sounds.   Pulmonary/Chest: Effort normal and breath sounds normal.  Abdominal: Soft. Bowel sounds are normal.  Musculoskeletal: Normal range of motion. She exhibits no edema and no tenderness.  Neurological: She is alert.  Skin: Skin is warm and dry.  Psychiatric: She has a normal mood and affect.          Assessment & Plan:  Assessment: 1. Low back pain-resolved 2. Hypertension 3. Hyperlipidemia-uncontrolled  Plan: Urine culture sent. Continue current medications. Labs obtained. When an outpatient and the results. Her sodium diet, exercise will followup as scheduled or sooner as needed.

## 2013-03-04 LAB — URINE CULTURE: Colony Count: 30000

## 2013-03-07 ENCOUNTER — Ambulatory Visit (HOSPITAL_COMMUNITY): Payer: Self-pay | Admitting: Psychiatry

## 2013-03-08 ENCOUNTER — Encounter (HOSPITAL_COMMUNITY): Payer: Self-pay | Admitting: Psychiatry

## 2013-03-29 ENCOUNTER — Other Ambulatory Visit: Payer: Self-pay

## 2013-03-29 MED ORDER — MELOXICAM 7.5 MG PO TABS: 7.5000 mg | ORAL_TABLET | Freq: Every day | ORAL | Status: DC

## 2013-03-29 MED ORDER — LEVOTHYROXINE SODIUM 25 MCG PO TABS: 25.0000 ug | ORAL_TABLET | Freq: Every day | ORAL | Status: DC

## 2013-04-09 ENCOUNTER — Other Ambulatory Visit: Payer: Self-pay

## 2013-04-09 MED ORDER — MELOXICAM 7.5 MG PO TABS: 7.5000 mg | ORAL_TABLET | Freq: Every day | ORAL | Status: DC

## 2013-04-09 MED ORDER — LEVOTHYROXINE SODIUM 25 MCG PO TABS: 25.0000 ug | ORAL_TABLET | Freq: Every day | ORAL | Status: DC

## 2013-05-03 ENCOUNTER — Other Ambulatory Visit: Payer: Self-pay

## 2013-05-04 ENCOUNTER — Ambulatory Visit: Payer: Self-pay | Admitting: Family

## 2013-05-04 ENCOUNTER — Ambulatory Visit: Payer: Self-pay

## 2013-05-04 ENCOUNTER — Ambulatory Visit (INDEPENDENT_AMBULATORY_CARE_PROVIDER_SITE_OTHER): Payer: Medicare Other

## 2013-05-04 DIAGNOSIS — Z23 Encounter for immunization: Secondary | ICD-10-CM

## 2013-05-06 ENCOUNTER — Encounter: Payer: Self-pay | Admitting: Family

## 2013-05-07 ENCOUNTER — Other Ambulatory Visit: Payer: Self-pay

## 2013-05-07 MED ORDER — LOSARTAN POTASSIUM 100 MG PO TABS: 100.0000 mg | ORAL_TABLET | Freq: Every day | ORAL | Status: DC

## 2013-05-07 MED ORDER — LEVOTHYROXINE SODIUM 25 MCG PO TABS: 25.0000 ug | ORAL_TABLET | Freq: Every day | ORAL | Status: DC

## 2013-05-07 MED ORDER — POTASSIUM CHLORIDE ER 10 MEQ PO TBCR: 10.0000 meq | EXTENDED_RELEASE_TABLET | Freq: Two times a day (BID) | ORAL | Status: DC

## 2013-05-07 MED ORDER — PITAVASTATIN CALCIUM 2 MG PO TABS: 2.0000 mg | ORAL_TABLET | Freq: Every day | ORAL | Status: DC

## 2013-05-07 MED ORDER — FLUTICASONE-SALMETEROL 100-50 MCG/DOSE IN AEPB: 1.0000 | INHALATION_SPRAY | Freq: Two times a day (BID) | RESPIRATORY_TRACT | Status: DC

## 2013-05-07 MED ORDER — ESOMEPRAZOLE MAGNESIUM 40 MG PO CPDR: 40.0000 mg | DELAYED_RELEASE_CAPSULE | Freq: Every day | ORAL | Status: DC

## 2013-05-07 MED ORDER — MONTELUKAST SODIUM 10 MG PO TABS: 10.0000 mg | ORAL_TABLET | Freq: Every day | ORAL | Status: DC

## 2013-05-07 MED ORDER — AMITRIPTYLINE HCL 10 MG PO TABS: 20.0000 mg | ORAL_TABLET | Freq: Every day | ORAL | Status: DC

## 2013-05-07 MED ORDER — MELOXICAM 7.5 MG PO TABS: 7.5000 mg | ORAL_TABLET | Freq: Every day | ORAL | Status: DC

## 2013-05-07 MED ORDER — AMLODIPINE BESYLATE 10 MG PO TABS: 10.0000 mg | ORAL_TABLET | Freq: Every day | ORAL | Status: DC

## 2013-05-07 MED ORDER — ALPRAZOLAM 0.5 MG PO TABS: 0.5000 mg | ORAL_TABLET | Freq: Every evening | ORAL | Status: DC | PRN

## 2013-05-07 MED ORDER — ZOLPIDEM TARTRATE 5 MG PO TABS: 5.0000 mg | ORAL_TABLET | Freq: Every evening | ORAL | Status: DC | PRN

## 2013-05-14 ENCOUNTER — Encounter: Payer: Self-pay | Admitting: Family

## 2013-05-28 ENCOUNTER — Ambulatory Visit (INDEPENDENT_AMBULATORY_CARE_PROVIDER_SITE_OTHER): Payer: Medicare Other | Admitting: Family

## 2013-05-28 ENCOUNTER — Encounter: Payer: Self-pay | Admitting: Family

## 2013-05-28 VITALS — BP 118/64 | HR 59 | Wt 165.0 lb

## 2013-05-28 DIAGNOSIS — Z853 Personal history of malignant neoplasm of breast: Secondary | ICD-10-CM

## 2013-05-28 DIAGNOSIS — M79605 Pain in left leg: Secondary | ICD-10-CM

## 2013-05-28 DIAGNOSIS — Z9010 Acquired absence of unspecified breast and nipple: Secondary | ICD-10-CM

## 2013-05-28 DIAGNOSIS — Z9013 Acquired absence of bilateral breasts and nipples: Secondary | ICD-10-CM

## 2013-05-28 DIAGNOSIS — M79609 Pain in unspecified limb: Secondary | ICD-10-CM

## 2013-05-28 DIAGNOSIS — R0602 Shortness of breath: Secondary | ICD-10-CM

## 2013-05-28 NOTE — Patient Instructions (Signed)

## 2013-05-28 NOTE — Progress Notes (Signed)
Subjective:    Patient ID: Gina Weber, female    DOB: 08/11/1945, 67 y.o.   MRN: [id number]  HPI  67 year old white female, nonsmoker is in today with complaints of left leg pain and swelling x1 week that is worse with walking. She rates the pain as 6-7/10 with walking. No pain with sitting. Denies any redness. No history of deep vein thrombosis.  Patient also had concerns of occasional shortness of breath. She has had a history of breast cancer x2, currently in remission. Right now, she sees an oncologist in [place]. Has not seen the oncologist in about 7-8 months. She has grown very concerned about my MRI of her chest. Denies any chest pain or palpitations. She's had a bilateral mastectomy  Has increased fatigue and stress related to dealing with her son running from the police officers. She also has a husband is chronically ill.  Review of Systems  Constitutional: Negative.   HENT: Negative.   Respiratory: Positive for shortness of breath. Negative for cough and wheezing.   Cardiovascular: Negative.   Gastrointestinal: Negative.   Endocrine: Negative.   Genitourinary: Negative.   Musculoskeletal: Negative for arthralgias, back pain, neck pain and neck stiffness.       Left leg pain  Skin: Negative.   Neurological: Negative.   Hematological: Negative.   Psychiatric/Behavioral: Negative.    Past Medical History  Diagnosis Date  . Fibromyalgia   . Arthritis   . Allergy   . Hyperlipidemia   . Hypertension   . GERD (gastroesophageal reflux disease)   . Depression   . COPD (chronic obstructive pulmonary disease)   . Anxiety   . Diverticulosis   . Breast cancer 1987 & 2006    radiation therapy    History   Social History  . Marital Status: Married    Spouse Name: N/A    Number of Children: N/A  . Years of Education: N/A   Occupational History  . Not on file.   Social History Main Topics  . Smoking status: Never Smoker   . Smokeless tobacco: Never Used    . Alcohol Use: No  . Drug Use: No  . Sexual Activity:    Other Topics Concern  . Not on file   Social History Narrative  . No narrative on file    Past Surgical History  Procedure Laterality Date  . Abdominal hysterectomy    . Mastectomy  1987    bilateral f/u reconstruction  . Cataract extraction      left eye  . Appendectomy      Family History  Problem Relation Age of Onset  . Cancer Mother 60    esophageal   . Aneurysm Father     AAA  . Heart disease Father   . Depression Son     bipolar  . Colon cancer Neg Hx     Allergies  Allergen Reactions  . Seroquel [Quetiapine Fumarate] Anaphylaxis  . Pravastatin Sodium     REACTION: liver enzyme abnormality---per pt's report  . Statins     REACTION: leg pain, elevates liver enzymes  . Sulfamethoxazole     REACTION: hives    Current Outpatient Prescriptions on File Prior to Visit  Medication Sig Dispense Refill  . albuterol (PROAIR HFA) 108 (90 BASE) MCG/ACT inhaler Inhale 2 puffs into the lungs every 6 (six) hours as needed for wheezing.  18 g  5  . ALPRAZolam (XANAX) 0.5 MG tablet Take 1 tablet (0.5 mg total) by  mouth at bedtime as needed. For sleep  90 tablet  0  . amitriptyline (ELAVIL) 10 MG tablet Take 2 tablets (20 mg total) by mouth at bedtime.  180 tablet  0  . amLODipine (NORVASC) 10 MG tablet Take 1 tablet (10 mg total) by mouth daily.  90 tablet  1  . DULoxetine (CYMBALTA) 30 MG capsule Take 2 capsules (60 mg total) by mouth daily.  60 capsule  3  . esomeprazole (NEXIUM) 40 MG capsule Take 1 capsule (40 mg total) by mouth daily.  90 capsule  3  . Fluticasone-Salmeterol (ADVAIR DISKUS) 100-50 MCG/DOSE AEPB Inhale 1 puff into the lungs every 12 (twelve) hours.  180 each  3  . HYDROcodone-acetaminophen (NORCO) 10-325 MG per tablet Take 0.5-1 tablets by mouth every 8 (eight) hours as needed for pain.  30 tablet  3  . levothyroxine (LEVOTHROID) 25 MCG tablet Take 1 tablet (25 mcg total) by mouth daily.  90  tablet  1  . losartan (COZAAR) 100 MG tablet Take 1 tablet (100 mg total) by mouth daily.  90 tablet  1  . meloxicam (MOBIC) 7.5 MG tablet Take 1 tablet (7.5 mg total) by mouth daily.  90 tablet  0  . montelukast (SINGULAIR) 10 MG tablet Take 1 tablet (10 mg total) by mouth at bedtime.  90 tablet  1  . Pitavastatin Calcium 2 MG TABS Take 1 tablet (2 mg total) by mouth daily.  90 tablet  0  . potassium chloride (K-DUR) 10 MEQ tablet Take 1 tablet (10 mEq total) by mouth 2 (two) times daily.  60 tablet  2  . zolpidem (AMBIEN) 5 MG tablet Take 1 tablet (5 mg total) by mouth at bedtime as needed.  90 tablet  0   No current facility-administered medications on file prior to visit.    BP 118/64  Pulse 59  Wt 165 lb (74.844 kg)chart    Objective:   Physical Exam  Constitutional: She is oriented to person, place, and time. She appears well-developed and well-nourished.  HENT:  Right Ear: External ear normal.  Left Ear: External ear normal.  Nose: Nose normal.  Mouth/Throat: Oropharynx is clear and moist.  Neck: Normal range of motion. Neck supple.  Cardiovascular: Normal rate and regular rhythm.   Pulmonary/Chest: Effort normal and breath sounds normal.  Abdominal: Soft. Bowel sounds are normal. She exhibits no distension. There is no tenderness. There is no rebound.  Musculoskeletal: She exhibits tenderness. She exhibits no edema.  Mild tenderness to palpation of the anterior aspect of the left lower leg. Pedal pulses 2 out of 2. No edema.  Neurological: She is alert and oriented to person, place, and time. She has normal reflexes.  Skin: Skin is warm and dry.  Psychiatric: She has a normal mood and affect.          Assessment & Plan:  Assessment: 1. Left leg pain 2. Shortness of breath 3. Fatigue  Plan: Lower extremity Doppler study ordered. MRI of the chest ordered due to her history of breast cancer. Refer to oncology to get established here. Labs to include UA and CBC within  patient the results. Call the office with any questions or concerns.

## 2013-05-28 NOTE — Progress Notes (Signed)
Pre visit review using our clinic review tool, if applicable. No additional management support is needed unless otherwise documented below in the visit note. 

## 2013-05-30 ENCOUNTER — Encounter (HOSPITAL_COMMUNITY): Payer: Self-pay

## 2013-06-01 ENCOUNTER — Ambulatory Visit (HOSPITAL_COMMUNITY): Payer: Medicare Other | Attending: Family

## 2013-06-01 ENCOUNTER — Encounter: Payer: Self-pay | Admitting: Cardiovascular Disease

## 2013-06-01 ENCOUNTER — Ambulatory Visit
Admission: RE | Admit: 2013-06-01 | Discharge: 2013-06-01 | Disposition: A | Discharge status: Home / Self Care | Payer: Medicare Other | Source: Ambulatory Visit | Attending: Family | Admitting: Family

## 2013-06-01 DIAGNOSIS — J449 Chronic obstructive pulmonary disease, unspecified: Secondary | ICD-10-CM | POA: Insufficient documentation

## 2013-06-01 DIAGNOSIS — Z853 Personal history of malignant neoplasm of breast: Secondary | ICD-10-CM

## 2013-06-01 DIAGNOSIS — M79609 Pain in unspecified limb: Secondary | ICD-10-CM | POA: Insufficient documentation

## 2013-06-01 DIAGNOSIS — R0602 Shortness of breath: Secondary | ICD-10-CM

## 2013-06-01 DIAGNOSIS — M7989 Other specified soft tissue disorders: Secondary | ICD-10-CM | POA: Insufficient documentation

## 2013-06-01 DIAGNOSIS — E785 Hyperlipidemia, unspecified: Secondary | ICD-10-CM | POA: Insufficient documentation

## 2013-06-01 DIAGNOSIS — M79605 Pain in left leg: Secondary | ICD-10-CM

## 2013-06-01 DIAGNOSIS — I10 Essential (primary) hypertension: Secondary | ICD-10-CM | POA: Insufficient documentation

## 2013-06-01 DIAGNOSIS — J4489 Other specified chronic obstructive pulmonary disease: Secondary | ICD-10-CM | POA: Insufficient documentation

## 2013-06-04 ENCOUNTER — Telehealth: Payer: Self-pay | Admitting: Hematology and Oncology

## 2013-06-04 NOTE — Telephone Encounter (Signed)
C/D 07/25/12 for appt.07/25/12 °

## 2013-06-04 NOTE — Telephone Encounter (Signed)
S/W PT AND [facility] NP APPT 12/16 @ 10:30 W/DR. GORSUCH REFERRING DR. PADONDA CAMPBELL DX- H/O OF BREAST CA; H/O OF BILATERAL MASTECTOMY  WELCOME PACKET MAILED.

## 2013-06-12 ENCOUNTER — Telehealth: Payer: Self-pay | Admitting: Hematology and Oncology

## 2013-06-12 ENCOUNTER — Ambulatory Visit: Payer: Medicare Other

## 2013-06-12 ENCOUNTER — Encounter: Payer: Self-pay | Admitting: Hematology and Oncology

## 2013-06-12 ENCOUNTER — Ambulatory Visit (HOSPITAL_BASED_OUTPATIENT_CLINIC_OR_DEPARTMENT_OTHER): Payer: Medicare Other | Admitting: Hematology and Oncology

## 2013-06-12 VITALS — BP 146/72 | HR 90 | Temp 97.9°F | Resp 17 | Ht 65.0 in | Wt 165.3 lb

## 2013-06-12 DIAGNOSIS — R229 Localized swelling, mass and lump, unspecified: Secondary | ICD-10-CM

## 2013-06-12 DIAGNOSIS — M8580 Other specified disorders of bone density and structure, unspecified site: Secondary | ICD-10-CM

## 2013-06-12 DIAGNOSIS — R5381 Other malaise: Secondary | ICD-10-CM

## 2013-06-12 DIAGNOSIS — Z853 Personal history of malignant neoplasm of breast: Secondary | ICD-10-CM

## 2013-06-12 DIAGNOSIS — M899 Disorder of bone, unspecified: Secondary | ICD-10-CM

## 2013-06-12 HISTORY — DX: Other specified disorders of bone density and structure, unspecified site: M85.80

## 2013-06-12 NOTE — Progress Notes (Signed)
Checked in new patient with new patient with no financial issues. She has not been [place] and she has her appt card.

## 2013-06-12 NOTE — Progress Notes (Signed)
[facility] CONSULT NOTE  Patient Care Team: Baker Pierini, FNP as PCP - General (Family Medicine) Wendall Stade, MD as Attending Physician (Cardiology)  CHIEF COMPLAINTS/PURPOSE OF CONSULTATION:  History of recurrent right breast cancer status post bilateral mastectomy, radiation therapy and endocrine therapy  HISTORY OF PRESENTING ILLNESS:  Gina Weber 67 y.o. female is here because of background history of right breast cancer. No outside records were available but the patient is an excellent historian. In 1987, she underwent a routine mammogram which showed abnormal calcification. Her prior mammogram a year before was normal. The patient was recommended biopsy and it confirmed breast cancer. 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 Sentinel lymph node biopsy was negative. The patient was recommended reconstruction surgery and she elected for prophylactic left mastectomy followed by implant reconstruction surgery on both chest wall. 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 Approximately 6 years later, in 1993, she had evidence of implant rupture some both sites and she had both implants removed. In 2006, she felt a knot along the mastectomy scar on the right breast.  According to her, further evaluation including biopsy confirmed recurrence of cancer in the local area. She underwent further resection of her chest wall followed by complete lymph node dissection on the right side. None of the lymph nodes were involved. She cannot remember the size of the tumor but she felt definitely was less than 2 cm and after surgery, she received adjuvant radiation therapy. According to her, the breast cancer was invasive type, early stage and it was estrogen receptor positive. She received adjuvant endocrine  therapy with tamoxifen for a few months complicated by severe bone pain. She was subsequently switched to Femara and she took it for 2 years but it causes significant bone pain and she subsequently made an informed consent to discontinue adjuvant treatment.   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 She did not breast-feed any of her children. She received both control pills for approximately 6 years prior to hysterectomy. She denies taking hormone replacement therapy. There were no family history of breast cancer or GYN cancer in the family. Her mother did have esophageal cancer diagnosed around age 46 and subsequently died from it.  MEDICAL HISTORY:  Past Medical History  Diagnosis Date  . Fibromyalgia   . Arthritis   . Allergy   . Hyperlipidemia   . Hypertension   . GERD (gastroesophageal reflux disease)   . Depression   . COPD (chronic obstructive pulmonary disease)   . Anxiety   . Diverticulosis   . Breast cancer 1987 & 2006    radiation therapy  . Thyroid disease   . Osteopenia 06/12/2013    SURGICAL HISTORY: Past Surgical History  Procedure Laterality Date  . Abdominal hysterectomy    . Mastectomy  1987    bilateral f/u reconstruction  . Cataract extraction      left eye  . Appendectomy      SOCIAL HISTORY: History   Social History  . Marital Status: Married    Spouse Name: N/A    Number of Children: N/A  .  Years of Education: N/A   Occupational History  . Not on file.   Social History Main Topics  . Smoking status: Never Smoker   . Smokeless tobacco: Never Used  . Alcohol Use: No  . Drug Use: No  . Sexual Activity: Not on file   Other Topics Concern  . Not on file   Social History Narrative  . No narrative on file    FAMILY  HISTORY: Family History  Problem Relation Age of Onset  . Cancer Mother 51    esophageal   . Aneurysm Father     AAA  . Heart disease Father   . Depression Son     bipolar  . Colon cancer Neg Hx     ALLERGIES:  is allergic to seroquel; pravastatin sodium; statins; and sulfamethoxazole.  MEDICATIONS:  Current Outpatient Prescriptions  Medication Sig Dispense Refill  . ALPRAZolam (XANAX) 0.5 MG tablet Take 1 tablet (0.5 mg total) by mouth at bedtime as needed. For sleep  90 tablet  0  . amitriptyline (ELAVIL) 10 MG tablet Take 2 tablets (20 mg total) by mouth at bedtime.  180 tablet  0  . amLODipine (NORVASC) 10 MG tablet Take 1 tablet (10 mg total) by mouth daily.  90 tablet  1  . DULoxetine (CYMBALTA) 30 MG capsule Take 2 capsules (60 mg total) by mouth daily.  60 capsule  3  . esomeprazole (NEXIUM) 40 MG capsule Take 1 capsule (40 mg total) by mouth daily.  90 capsule  3  . Fluticasone-Salmeterol (ADVAIR DISKUS) 100-50 MCG/DOSE AEPB Inhale 1 puff into the lungs every 12 (twelve) hours.  180 each  3  . HYDROcodone-acetaminophen (NORCO) 10-325 MG per tablet Take 0.5-1 tablets by mouth every 8 (eight) hours as needed for pain.  30 tablet  3  . levothyroxine (LEVOTHROID) 25 MCG tablet Take 1 tablet (25 mcg total) by mouth daily.  90 tablet  1  . losartan (COZAAR) 100 MG tablet Take 1 tablet (100 mg total) by mouth daily.  90 tablet  1  . meloxicam (MOBIC) 7.5 MG tablet Take 1 tablet (7.5 mg total) by mouth daily.  90 tablet  0  . montelukast (SINGULAIR) 10 MG tablet Take 1 tablet (10 mg total) by mouth at bedtime.  90 tablet  1  . Pitavastatin Calcium 2 MG TABS Take 1 tablet (2 mg total) by mouth daily.  90 tablet  0  . potassium chloride (K-DUR) 10 MEQ tablet Take 1 tablet (10 mEq total) by mouth 2 (two) times daily.  60 tablet  2  . zolpidem (AMBIEN) 5 MG tablet Take 1 tablet (5 mg total) by mouth at bedtime as needed.  90 tablet  0  . albuterol (PROAIR HFA) 108 (90 BASE) MCG/ACT  inhaler Inhale 2 puffs into the lungs every 6 (six) hours as needed for wheezing.  18 g  5   No current facility-administered medications for this visit.    REVIEW OF SYSTEMS:  Her only main positive review of systems include diffuse musculoskeletal pain and profound fatigue Constitutional: Denies fevers, chills or abnormal night sweats Eyes: Denies blurriness of vision, double vision or watery eyes Ears, nose, mouth, throat, and face: Denies mucositis or sore throat Respiratory: Denies cough, dyspnea or wheezes Cardiovascular: Denies palpitation, chest discomfort or lower extremity swelling Gastrointestinal:  Denies nausea, heartburn or change in bowel habits Skin: Denies abnormal skin rashes Lymphatics: Denies new lymphadenopathy or easy bruising Neurological:Denies numbness, tingling or new weaknesses Behavioral/Psych: Mood is  stable, no new changes  All other systems were reviewed with the patient and are negative.  PHYSICAL EXAMINATION: ECOG PERFORMANCE STATUS: 1 - Symptomatic but completely ambulatory  Filed Vitals:   06/12/13 1026  BP: 146/72  Pulse: 90  Temp: 97.9 F (36.6 C)  Resp: 17   Filed Weights   06/12/13 1026  Weight: 165 lb 4.8 oz (74.98 kg)    GENERAL:alert, no distress and comfortable SKIN: skin color, texture, turgor are normal, no rashes or significant lesions EYES: normal, conjunctiva are pink and non-injected, sclera clear OROPHARYNX:no exudate, no erythema and lips, buccal mucosa, and tongue normal  NECK: supple, thyroid normal size, non-tender, without nodularity LYMPH:  no palpable lymphadenopathy in the cervical, axillary or inguinal LUNGS: clear to auscultation and percussion with normal breathing effort HEART: regular rate & rhythm and no murmurs and no lower extremity edema ABDOMEN:abdomen soft, non-tender and normal bowel sounds Musculoskeletal:no cyanosis of digits and no clubbing  PSYCH: alert & oriented x 3 with fluent speech NEURO: no  focal motor/sensory deficits Bilateral breast examination was performed. Well-healed mastectomy scar on both sides. Along the right mastectomy scar, there is a small 1 cm nodule of unknown etiology. It is not fixed to the chest wall. LABORATORY DATA:  I have reviewed the data as listed  RADIOGRAPHIC STUDIES: I have personally reviewed the radiological images as listed and agreed with the findings in the report. Ct Chest Wo Contrast  06/01/2013   CLINICAL DATA:  Shortness of breath with exertion, COPD, left chest/back/bilateral shoulder pain. History of right breast cancer.  EXAM: CT CHEST WITHOUT CONTRAST  TECHNIQUE: Multidetector CT imaging of the chest was performed following the standard protocol without IV contrast.  COMPARISON:  Chest radiographs dated 09/06/2012  FINDINGS: Right apical pleural parenchymal scarring, favored to reflect radiation changes.  Associated 7 mm vague nodule in the medial right upper lobe (series 4/ image 13), possibly reflecting nodular scarring.  No pleural effusion or pneumothorax.  Visualized thyroid is unremarkable.  Heart is normal in size. No pericardial effusion. Atherosclerotic calcifications of the aortic arch.  No suspicious mediastinal or axillary lymphadenopathy.  Status post bilateral mastectomy with right axillary lymph node dissection.  Visualized upper abdomen is notable for mild hepatic steatosis and vascular calcifications.  Mild degenerative changes of the visualized thoracolumbar spine.  IMPRESSION: Status post bilateral mastectomy with right axillary lymph node dissection.  Radiation changes in the right upper lobe.  Associated 7 mm vague nodule in the medial right upper lobe, possibly reflecting nodular scarring. While not overly suspicious for metastases, follow-up CT chest is suggested in 3-6 months given the clinical history.   Electronically Signed   By: Charline Bills M.D.   On: 06/01/2013 15:16    ASSESSMENT:  #1 history of recurrent breast  cancer on the right, no evidence of active disease #2 probable nodule along the mastectomy scar, likely postoperative change  PLAN:  #1 history of recurrent breast cancer on the right, no evidence of active disease #2 probable nodule along the mastectomy scar, likely postoperative change I reviewed the imaging study of her most recent CT scan of the chest and cannot appreciate significant abnormalities on the chest wall at a region that I palpated along her mastectomy scar. I suspect this nodule is likely postsurgical seroma. I recommend diagnostic ultrasound to rule out recurrence of disease or cyst I will see her back to review of test results. My suspicion for recurrence of breast cancer is low #3 diffuse bone pain  I suspect the patient may have worsening bone density. I recommend she take calcium with vitamin D supplements. I recommend rechecking a bone density #4 fatigue The patient is on a lot of medications for bone pain. Hopefully if the vitamin D supplement to help her for bone pain, she can come off some of her pain medicine.  Orders Placed This Encounter  Procedures  . DG Bone Density    EPIC ORDER PF 3 OR 4 YEARS AGO @ [facility]      Standing Status: Future     Number of Occurrences:      Standing Expiration Date: 06/12/2014    Order Specific Question:  Reason for exam:    Answer:  osteopenia    Order Specific Question:  Preferred imaging location?    Answer:  [facility]  . US Breast Right    EPIC ORDER PF 1987 [facility] BILATERAL MASTECTOMY W RECONSTRUCTION; 1993 IMPLANTS RUPTURED AND THEY WERE REMOVED; 2006 PT FOUND LUMP IN SCAR TISSUE        NO NEEDS      PT/ROSE       [facility] PT INDICATED BREAST IMAGES PURGED/CHEST CT AVAILABLE.    Standing Status: Future     Number of Occurrences:      Standing Expiration Date: 06/12/2014    Order Specific Question:  Reason for Exam (SYMPTOM  OR DIAGNOSIS REQUIRED)    Answer:  Hx mastectomy, palpable nodule along inferior  scar, r/o recurrence/cyst    Order Specific Question:  Preferred imaging location?    Answer:  [facility]    All questions were answered. The patient knows to call the clinic with any problems, questions or concerns. I spent 55 minutes counseling the patient face to face. The total time spent in the appointment was 60 minutes and more than 50% was on counseling.     Davarion Cuffee, MD 06/12/2013 4:00 PM

## 2013-06-12 NOTE — Telephone Encounter (Signed)
gave pt appt for Md, Breast U/S, and Bone Density, md moved to january per MD after scans

## 2013-06-14 ENCOUNTER — Other Ambulatory Visit: Payer: Self-pay

## 2013-06-14 MED ORDER — DULOXETINE HCL 30 MG PO CPEP: 60.0000 mg | ORAL_CAPSULE | Freq: Every day | ORAL | Status: DC

## 2013-06-27 ENCOUNTER — Ambulatory Visit: Payer: Self-pay | Admitting: Hematology and Oncology

## 2013-07-11 ENCOUNTER — Other Ambulatory Visit: Payer: Self-pay

## 2013-07-16 ENCOUNTER — Ambulatory Visit: Payer: Self-pay | Admitting: Hematology and Oncology

## 2013-07-16 ENCOUNTER — Encounter: Payer: Self-pay | Admitting: *Deleted

## 2013-07-19 ENCOUNTER — Telehealth: Payer: Self-pay | Admitting: *Deleted

## 2013-07-19 NOTE — Telephone Encounter (Signed)
Pt called to r/s her US breast and bone density scan and appt w/ Dr. Alvy Bimler.  States missed her appts due to inclement weather.  New POF sent to get these re-scheduled.

## 2013-07-19 NOTE — Telephone Encounter (Signed)
Informed pt of request sent to Scheduler to r/s appts (see below).  She verbalized understanding.

## 2013-07-20 ENCOUNTER — Telehealth: Payer: Self-pay | Admitting: Hematology and Oncology

## 2013-07-20 NOTE — Telephone Encounter (Signed)
lvm for pt regarding to Feb appts...mailed pt appt sched, avs and letter

## 2013-07-22 ENCOUNTER — Telehealth: Payer: Self-pay | Admitting: Family

## 2013-07-22 NOTE — Telephone Encounter (Signed)
[facility] ([address] [place] [address]) requesting refill of ALPRAZolam Duanne Moron) 0.5 MG tablet #90, last filled 01/30/13.

## 2013-07-23 MED ORDER — ALPRAZOLAM 0.5 MG PO TABS: 0.5000 mg | ORAL_TABLET | Freq: Every evening | ORAL | Status: DC | PRN

## 2013-07-23 NOTE — Telephone Encounter (Signed)
Rx faxed

## 2013-08-02 ENCOUNTER — Ambulatory Visit
Admission: RE | Admit: 2013-08-02 | Discharge: 2013-08-02 | Disposition: A | Discharge status: Home / Self Care | Payer: Self-pay | Source: Ambulatory Visit | Attending: Hematology and Oncology | Admitting: Hematology and Oncology

## 2013-08-02 DIAGNOSIS — Z853 Personal history of malignant neoplasm of breast: Secondary | ICD-10-CM

## 2013-08-02 DIAGNOSIS — M8580 Other specified disorders of bone density and structure, unspecified site: Secondary | ICD-10-CM

## 2013-08-07 ENCOUNTER — Ambulatory Visit: Payer: Self-pay | Admitting: Hematology and Oncology

## 2013-08-09 ENCOUNTER — Encounter: Payer: Self-pay | Admitting: Family

## 2013-08-09 ENCOUNTER — Encounter: Payer: Self-pay | Admitting: Hematology and Oncology

## 2013-08-10 ENCOUNTER — Other Ambulatory Visit: Payer: Self-pay | Admitting: Hematology and Oncology

## 2013-08-10 ENCOUNTER — Ambulatory Visit: Payer: Medicare Other | Admitting: Family

## 2013-08-10 ENCOUNTER — Other Ambulatory Visit: Payer: Self-pay | Admitting: *Deleted

## 2013-08-10 ENCOUNTER — Telehealth: Payer: Self-pay | Admitting: Hematology and Oncology

## 2013-08-10 ENCOUNTER — Ambulatory Visit: Payer: Self-pay | Admitting: Hematology and Oncology

## 2013-08-10 NOTE — Telephone Encounter (Signed)
per 08/10/13 pof ok to r/s appt for today and r/s for 5yr. lmonvm for pt re appt for 08/09/14 and mailed schedule

## 2013-08-10 NOTE — Progress Notes (Signed)
Patient sent [facility] email to cancel appts. Per Dr Alvy Bimler, ok to cancel and see back in 1 year. POF to scheduling.

## 2013-08-13 ENCOUNTER — Telehealth: Payer: Self-pay | Admitting: *Deleted

## 2013-08-30 ENCOUNTER — Telehealth: Payer: Self-pay | Admitting: Family

## 2013-08-30 NOTE — Telephone Encounter (Signed)
[facility] requesting refill of potassium chloride (K-DUR) 10 MEQ tablet

## 2013-08-31 MED ORDER — POTASSIUM CHLORIDE ER 10 MEQ PO TBCR: 10.0000 meq | EXTENDED_RELEASE_TABLET | Freq: Two times a day (BID) | ORAL | Status: DC

## 2013-08-31 NOTE — Telephone Encounter (Signed)
Sent!

## 2013-09-12 ENCOUNTER — Ambulatory Visit (INDEPENDENT_AMBULATORY_CARE_PROVIDER_SITE_OTHER): Payer: Medicare Other | Admitting: Family

## 2013-09-12 ENCOUNTER — Encounter: Payer: Self-pay | Admitting: Family

## 2013-09-12 ENCOUNTER — Telehealth: Payer: Self-pay | Admitting: Family

## 2013-09-12 VITALS — BP 110/62 | HR 100 | Temp 99.3°F | Ht 65.0 in | Wt 166.0 lb

## 2013-09-12 DIAGNOSIS — E039 Hypothyroidism, unspecified: Secondary | ICD-10-CM

## 2013-09-12 DIAGNOSIS — G4700 Insomnia, unspecified: Secondary | ICD-10-CM

## 2013-09-12 DIAGNOSIS — E876 Hypokalemia: Secondary | ICD-10-CM

## 2013-09-12 DIAGNOSIS — I10 Essential (primary) hypertension: Secondary | ICD-10-CM

## 2013-09-12 LAB — BASIC METABOLIC PANEL
BUN: 8 mg/dL (ref 6–23)
CO2: 27 mEq/L (ref 19–32)
Calcium: 9.4 mg/dL (ref 8.4–10.5)
Chloride: 104 mEq/L (ref 96–112)
Creatinine, Ser: 0.8 mg/dL (ref 0.4–1.2)
GFR: 74.49 mL/min (ref >60.00)
Glucose, Bld: 92 mg/dL (ref 70–99)
POTASSIUM: 3.4 meq/L — AB (ref 3.5–5.1)
Sodium: 141 mEq/L (ref 135–145)

## 2013-09-12 LAB — HEPATIC FUNCTION PANEL
ALBUMIN: 4.1 g/dL (ref 3.5–5.2)
ALT: 30 U/L (ref 0–35)
AST: 21 U/L (ref 0–37)
Alkaline Phosphatase: 130 U/L — ABNORMAL HIGH (ref 39–117)
Bilirubin, Direct: 0 mg/dL (ref 0.0–0.3)
Total Bilirubin: 0.5 mg/dL (ref 0.3–1.2)
Total Protein: 7.1 g/dL (ref 6.0–8.3)

## 2013-09-12 LAB — LIPID PANEL
CHOLESTEROL: 254 mg/dL — AB (ref 0–200)
HDL: 43.2 mg/dL (ref >39.00)
LDL Cholesterol: 154 mg/dL — ABNORMAL HIGH (ref 0–99)
TRIGLYCERIDES: 282 mg/dL — AB (ref 0.0–149.0)
Total CHOL/HDL Ratio: 6
VLDL: 56.4 mg/dL — ABNORMAL HIGH (ref 0.0–40.0)

## 2013-09-12 LAB — TSH: TSH: 1.56 u[IU]/mL (ref 0.35–5.50)

## 2013-09-12 MED ORDER — POTASSIUM CHLORIDE ER 10 MEQ PO TBCR: 10.0000 meq | EXTENDED_RELEASE_TABLET | Freq: Two times a day (BID) | ORAL | Status: DC

## 2013-09-12 NOTE — Telephone Encounter (Signed)
Relevant patient education assigned to patient using Emmi. ° °

## 2013-09-12 NOTE — Patient Instructions (Signed)
Hypokalemia Hypokalemia means that the amount of potassium in the blood is lower than normal.Potassium is a chemical, called an electrolyte, that helps regulate the amount of fluid in the body. It also stimulates muscle contraction and helps nerves function properly.Most of the body's potassium is inside of cells, and only a very small amount is in the blood. Because the amount in the blood is so small, minor changes can be life-threatening. CAUSES  Antibiotics.  Diarrhea or vomiting.  Using laxatives too much, which can cause diarrhea.  Chronic kidney disease.  Water pills (diuretics).  Eating disorders (bulimia).  Low magnesium level.  Sweating a lot. SIGNS AND SYMPTOMS  Weakness.  Constipation.  Fatigue.  Muscle cramps.  Mental confusion.  Skipped heartbeats or irregular heartbeat (palpitations).  Tingling or numbness. DIAGNOSIS  Your health care provider can diagnose hypokalemia with blood tests. In addition to checking your potassium level, your health care provider may also check other lab tests. TREATMENT Hypokalemia can be treated with potassium supplements taken by mouth or adjustments in your current medicines. If your potassium level is very low, you may need to get potassium through a vein (IV) and be monitored in the hospital. A diet high in potassium is also helpful. Foods high in potassium are:  Nuts, such as peanuts and pistachios.  Seeds, such as sunflower seeds and pumpkin seeds.  Peas, lentils, and lima beans.  Whole grain and bran cereals and breads.  Fresh fruit and vegetables, such as apricots, avocado, bananas, cantaloupe, kiwi, oranges, tomatoes, asparagus, and potatoes.  Orange and tomato juices.  Red meats.  Fruit yogurt. HOME CARE INSTRUCTIONS  Take all medicines as prescribed by your health care provider.  Maintain a healthy diet by including nutritious food, such as fruits, vegetables, nuts, whole grains, and lean meats.  If  you are taking a laxative, be sure to follow the directions on the label. SEEK MEDICAL CARE IF:  Your weakness gets worse.  You feel your heart pounding or racing.  You are vomiting or having diarrhea.  You are diabetic and having trouble keeping your blood glucose in the normal range. SEEK IMMEDIATE MEDICAL CARE IF:  You have chest pain, shortness of breath, or dizziness.  You are vomiting or having diarrhea for more than 2 days.  You faint. MAKE SURE YOU:   Understand these instructions.  Will watch your condition.  Will get help right away if you are not doing well or get worse. Document Released: 06/14/2005 Document Revised: 04/04/2013 Document Reviewed: 12/15/2012 Exit[employer] Patient Information 2014 [employer].  

## 2013-09-12 NOTE — Progress Notes (Signed)
Pre visit review using our clinic review tool, if applicable. No additional management support is needed unless otherwise documented below in the visit note. 

## 2013-09-12 NOTE — Progress Notes (Signed)
Subjective:    Patient ID: Gina Weber, female    DOB: Sep 25, 1945, 68 y.o.   MRN: [id number]  HPI 68 year old white female, nonsmoker is in today for recheck of hypertension, hypercholesterolemia, hypothyroidism insomnia, GERD is in today for recheck. He reports she's been off her potassium 1-1/2 weeks and has noticed an increase in palpitations or muscle aches. Is also requesting a referral to physical therapy for right arm weakness related to scar tissue from a mastectomy but she had several years ago.   Review of Systems  Constitutional: Negative.   HENT: Negative.   Respiratory: Negative.   Cardiovascular: Negative.   Gastrointestinal: Negative.   Endocrine: Negative.   Genitourinary: Negative.   Musculoskeletal: Positive for myalgias.  Skin: Negative.   Allergic/Immunologic: Negative.   Neurological: Negative.   Hematological: Negative.   Psychiatric/Behavioral: Negative.    Past Medical History  Diagnosis Date  . Fibromyalgia   . Arthritis   . Allergy   . Hyperlipidemia   . Hypertension   . GERD (gastroesophageal reflux disease)   . Depression   . COPD (chronic obstructive pulmonary disease)   . Anxiety   . Diverticulosis   . Breast cancer 1987 & 2006    radiation therapy  . Thyroid disease   . Osteopenia 06/12/2013    History   Social History  . Marital Status: Married    Spouse Name: N/A    Number of Children: N/A  . Years of Education: N/A   Occupational History  . Not on file.   Social History Main Topics  . Smoking status: Never Smoker   . Smokeless tobacco: Never Used  . Alcohol Use: No  . Drug Use: No  . Sexual Activity: Not on file   Other Topics Concern  . Not on file   Social History Narrative  . No narrative on file    Past Surgical History  Procedure Laterality Date  . Abdominal hysterectomy    . Mastectomy  1987    bilateral f/u reconstruction  . Cataract extraction      left eye  . Appendectomy      Family History    Problem Relation Age of Onset  . Cancer Mother 26    esophageal   . Aneurysm Father     AAA  . Heart disease Father   . Depression Son     bipolar  . Colon cancer Neg Hx     Allergies  Allergen Reactions  . Seroquel [Quetiapine Fumarate] Anaphylaxis  . Pravastatin Sodium     REACTION: liver enzyme abnormality---per pt's report  . Statins     REACTION: leg pain, elevates liver enzymes  . Sulfamethoxazole     REACTION: hives    Current Outpatient Prescriptions on File Prior to Visit  Medication Sig Dispense Refill  . ALPRAZolam (XANAX) 0.5 MG tablet Take 1 tablet (0.5 mg total) by mouth at bedtime as needed. For sleep  90 tablet  0  . amitriptyline (ELAVIL) 10 MG tablet Take 2 tablets (20 mg total) by mouth at bedtime.  180 tablet  0  . amLODipine (NORVASC) 10 MG tablet Take 1 tablet (10 mg total) by mouth daily.  90 tablet  1  . DULoxetine (CYMBALTA) 30 MG capsule Take 2 capsules (60 mg total) by mouth daily.  60 capsule  3  . esomeprazole (NEXIUM) 40 MG capsule Take 1 capsule (40 mg total) by mouth daily.  90 capsule  3  . Fluticasone-Salmeterol (ADVAIR DISKUS) 100-50 MCG/DOSE AEPB  Inhale 1 puff into the lungs every 12 (twelve) hours.  180 each  3  . HYDROcodone-acetaminophen (NORCO) 10-325 MG per tablet Take 0.5-1 tablets by mouth every 8 (eight) hours as needed for pain.  30 tablet  3  . levothyroxine (LEVOTHROID) 25 MCG tablet Take 1 tablet (25 mcg total) by mouth daily.  90 tablet  1  . losartan (COZAAR) 100 MG tablet Take 1 tablet (100 mg total) by mouth daily.  90 tablet  1  . meloxicam (MOBIC) 7.5 MG tablet Take 1 tablet (7.5 mg total) by mouth daily.  90 tablet  0  . montelukast (SINGULAIR) 10 MG tablet Take 1 tablet (10 mg total) by mouth at bedtime.  90 tablet  1  . Pitavastatin Calcium 2 MG TABS Take 1 tablet (2 mg total) by mouth daily.  90 tablet  0  . zolpidem (AMBIEN) 5 MG tablet Take 1 tablet (5 mg total) by mouth at bedtime as needed.  90 tablet  0  . albuterol  (PROAIR HFA) 108 (90 BASE) MCG/ACT inhaler Inhale 2 puffs into the lungs every 6 (six) hours as needed for wheezing.  18 g  5   No current facility-administered medications on file prior to visit.    BP 110/62  Pulse 100  Temp(Src) 99.3 F (37.4 C) (Oral)  Ht 5\' 5"  (1.651 m)  Wt 166 lb (75.297 kg)  BMI 27.62 kg/m2chart    Objective:   Physical Exam  Constitutional: She is oriented to person, place, and time. She appears well-developed and well-nourished.  HENT:  Right Ear: External ear normal.  Left Ear: External ear normal.  Nose: Nose normal.  Mouth/Throat: Oropharynx is clear and moist.  Neck: Normal range of motion. Neck supple.  Cardiovascular: Normal rate, regular rhythm and normal heart sounds.   Pulmonary/Chest: Breath sounds normal.  Abdominal: Soft. Bowel sounds are normal.  Musculoskeletal: Normal range of motion.  Neurological: She is alert and oriented to person, place, and time.  Skin: Skin is warm and dry.  Psychiatric: She has a normal mood and affect.          Assessment & Plan:  Gina Weber was seen today for follow-up.  Diagnoses and associated orders for this visit:  Hypokalemia - Basic Metabolic Panel - Lipid Panel - Hepatic Function Panel  Unspecified essential hypertension - Basic Metabolic Panel - Lipid Panel - Hepatic Function Panel  Insomnia - TSH - Lipid Panel - Hepatic Function Panel  Unspecified hypothyroidism - TSH - Lipid Panel - Hepatic Function Panel  Other Orders - potassium chloride (K-DUR) 10 MEQ tablet; Take 1 tablet (10 mEq total) by mouth 2 (two) times daily.   Call the office with any questions or concerns. Recheck in 4 months and sooner as needed.

## 2013-09-18 ENCOUNTER — Encounter: Payer: Self-pay | Admitting: Family

## 2013-09-19 ENCOUNTER — Other Ambulatory Visit: Payer: Self-pay

## 2013-09-19 MED ORDER — AMITRIPTYLINE HCL 10 MG PO TABS: 20.0000 mg | ORAL_TABLET | Freq: Every day | ORAL | Status: DC

## 2013-10-09 ENCOUNTER — Encounter: Payer: Self-pay | Admitting: Family

## 2013-10-10 MED ORDER — ALPRAZOLAM 0.5 MG PO TABS: 0.5000 mg | ORAL_TABLET | Freq: Every evening | ORAL | Status: DC | PRN

## 2013-10-10 MED ORDER — DULOXETINE HCL 30 MG PO CPEP: 60.0000 mg | ORAL_CAPSULE | Freq: Every day | ORAL | Status: DC

## 2013-10-10 MED ORDER — AMITRIPTYLINE HCL 10 MG PO TABS: 20.0000 mg | ORAL_TABLET | Freq: Every day | ORAL | Status: DC

## 2013-10-19 ENCOUNTER — Encounter: Payer: Self-pay | Admitting: Family

## 2013-10-24 NOTE — Progress Notes (Signed)
HPI: Mr. Gina Weber is a 68 year old former patient of Dr. Johnsie Cancel who will need to be est. with either Dr. Pennelope Bracken or Dr. Harl Bowie, we are following for ongoing assessment and management of hypertension. Most recent cardiac catheterization was completed in March of 2014 in the setting of chest pain. She was not found to have any evidence of CAD and was found to have a normal LV function. She was continued on medical regimen. She also has a history of DDD, with chronic arthritis.   She comes today with  chronic somatic complaints. GERD symptoms, tingling in her teeth, chronic back pain, and a normal stress related to a chronically ill husband. She denies any cardiac complaints and is medically compliant. Labs are followed by her primary care physician.      Allergies  Allergen Reactions  . Seroquel [Quetiapine Fumarate] Anaphylaxis  . Pravastatin Sodium     REACTION: liver enzyme abnormality---per pt's report  . Statins     REACTION: leg pain, elevates liver enzymes  . Sulfamethoxazole     REACTION: hives    Current Outpatient Prescriptions  Medication Sig Dispense Refill  . ALPRAZolam (XANAX) 0.5 MG tablet Take 1 tablet (0.5 mg total) by mouth at bedtime as needed. For sleep  90 tablet  0  . amitriptyline (ELAVIL) 10 MG tablet Take 2 tablets (20 mg total) by mouth at bedtime.  180 tablet  0  . amLODipine (NORVASC) 10 MG tablet Take 1 tablet (10 mg total) by mouth daily.  90 tablet  1  . esomeprazole (NEXIUM) 40 MG capsule Take 1 capsule (40 mg total) by mouth daily.  90 capsule  3  . Fluticasone-Salmeterol (ADVAIR DISKUS) 100-50 MCG/DOSE AEPB Inhale 1 puff into the lungs every 12 (twelve) hours.  180 each  3  . HYDROcodone-acetaminophen (NORCO) 10-325 MG per tablet Take 0.5-1 tablets by mouth every 8 (eight) hours as needed for pain.  30 tablet  3  . levothyroxine (LEVOTHROID) 25 MCG tablet Take 1 tablet (25 mcg total) by mouth daily.  90 tablet  1  . losartan (COZAAR) 100 MG tablet Take  1 tablet (100 mg total) by mouth daily.  90 tablet  1  . meloxicam (MOBIC) 7.5 MG tablet Take 1 tablet (7.5 mg total) by mouth daily.  90 tablet  0  . montelukast (SINGULAIR) 10 MG tablet Take 1 tablet (10 mg total) by mouth at bedtime.  90 tablet  1  . Pitavastatin Calcium 2 MG TABS Take 1 tablet (2 mg total) by mouth daily.  90 tablet  0  . potassium chloride (K-DUR) 10 MEQ tablet Take 1 tablet (10 mEq total) by mouth 2 (two) times daily.  60 tablet  2  . albuterol (PROAIR HFA) 108 (90 BASE) MCG/ACT inhaler Inhale 2 puffs into the lungs every 6 (six) hours as needed for wheezing.  18 g  5  . DULoxetine (CYMBALTA) 30 MG capsule Take 2 capsules (60 mg total) by mouth daily.  180 capsule  0   No current facility-administered medications for this visit.    Past Medical History  Diagnosis Date  . Fibromyalgia   . Arthritis   . Allergy   . Hyperlipidemia   . Hypertension   . GERD (gastroesophageal reflux disease)   . Depression   . COPD (chronic obstructive pulmonary disease)   . Anxiety   . Diverticulosis   . Breast cancer 1987 & 2006    radiation therapy  . Thyroid disease   .  Osteopenia 06/12/2013    Past Surgical History  Procedure Laterality Date  . Abdominal hysterectomy    . Mastectomy  1987    bilateral f/u reconstruction  . Cataract extraction      left eye  . Appendectomy      ROS:  Review of systems complete and found to be negative unless listed above  PHYSICAL EXAM BP 127/71  Pulse 93  Ht 5\' 5"  (1.651 m)  Wt 165 lb (74.844 kg)  BMI 27.46 kg/m2 General: Well developed, well nourished, in no acute distress Head: Eyes PERRLA, No xanthomas.   Normal cephalic and atramatic  Lungs: Clear bilaterally to auscultation and percussion. Heart: HRRR S1 S2, without MRG.  Pulses are 2+ & equal.            No carotid bruit. No JVD.  No abdominal bruits. No femoral bruits. Abdomen: Bowel sounds are positive, abdomen soft and non-tender without masses or                   Hernia's noted. Msk:  Back normal, normal gait. Normal strength and tone for age. Extremities: No clubbing, cyanosis or edema.  DP +1 Neuro: Alert and oriented X 3. Psych:  Good affect, responds appropriately  ASSESSMENT AND PLAN

## 2013-10-25 ENCOUNTER — Encounter: Payer: Self-pay | Admitting: Adult Health

## 2013-10-25 ENCOUNTER — Ambulatory Visit (INDEPENDENT_AMBULATORY_CARE_PROVIDER_SITE_OTHER): Payer: Medicare Other | Admitting: Adult Health

## 2013-10-25 VITALS — BP 127/71 | HR 93 | Ht 65.0 in | Wt 165.0 lb

## 2013-10-25 DIAGNOSIS — R079 Chest pain, unspecified: Secondary | ICD-10-CM

## 2013-10-25 DIAGNOSIS — I10 Essential (primary) hypertension: Secondary | ICD-10-CM

## 2013-10-25 DIAGNOSIS — E785 Hyperlipidemia, unspecified: Secondary | ICD-10-CM

## 2013-10-25 NOTE — Assessment & Plan Note (Signed)
Currently well-controlled. Would not make any changes in her medication. She will continue amlodipine. I have reminded her that the amlodipine can cause some lower extremity puffiness which she has complained about his fall. Will not make any other changes to her antihypertensive regimen. Losartan will be continued as well.
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
## 2013-10-25 NOTE — Assessment & Plan Note (Signed)
Followup labs for primary care provider.

## 2013-10-25 NOTE — Assessment & Plan Note (Signed)
She continues to have some occasional chest pain. She states that she is doing a lot of heavy lifting in yard work since her husband is ill. Catheterization was normal one year ago. 2 continue risk management. We will see her on a when necessary basis only.

## 2013-10-25 NOTE — Patient Instructions (Signed)
Your physician recommends that you schedule a follow-up appointment in: Please call our office as needed

## 2013-12-25 ENCOUNTER — Ambulatory Visit: Payer: Self-pay | Admitting: Family

## 2014-01-03 ENCOUNTER — Telehealth: Payer: Self-pay | Admitting: Family

## 2014-01-03 MED ORDER — POTASSIUM CHLORIDE ER 10 MEQ PO TBCR: 10.0000 meq | EXTENDED_RELEASE_TABLET | Freq: Two times a day (BID) | ORAL | Status: DC

## 2014-01-03 NOTE — Telephone Encounter (Signed)
Done

## 2014-01-03 NOTE — Telephone Encounter (Signed)
[place] is requesting re-fill on potassium chloride (K-DUR) 10 MEQ tablet

## 2014-01-16 ENCOUNTER — Encounter: Payer: Self-pay | Admitting: Family

## 2014-01-16 ENCOUNTER — Ambulatory Visit (INDEPENDENT_AMBULATORY_CARE_PROVIDER_SITE_OTHER): Payer: Medicare Other | Admitting: Family

## 2014-01-16 VITALS — BP 122/72 | HR 83 | Ht 65.0 in | Wt 162.0 lb

## 2014-01-16 DIAGNOSIS — I10 Essential (primary) hypertension: Secondary | ICD-10-CM

## 2014-01-16 DIAGNOSIS — K589 Irritable bowel syndrome without diarrhea: Secondary | ICD-10-CM

## 2014-01-16 DIAGNOSIS — R7309 Other abnormal glucose: Secondary | ICD-10-CM

## 2014-01-16 DIAGNOSIS — R739 Hyperglycemia, unspecified: Secondary | ICD-10-CM

## 2014-01-16 DIAGNOSIS — R197 Diarrhea, unspecified: Secondary | ICD-10-CM

## 2014-01-16 LAB — BASIC METABOLIC PANEL
BUN: 7 mg/dL (ref 6–23)
CALCIUM: 9.5 mg/dL (ref 8.4–10.5)
CO2: 26 mEq/L (ref 19–32)
CREATININE: 0.8 mg/dL (ref 0.4–1.2)
Chloride: 106 mEq/L (ref 96–112)
GFR: 75.49 mL/min (ref >60.00)
Glucose, Bld: 87 mg/dL (ref 70–99)
Potassium: 3.8 mEq/L (ref 3.5–5.1)
Sodium: 140 mEq/L (ref 135–145)

## 2014-01-16 LAB — CBC WITH DIFFERENTIAL/PLATELET
BASOS ABS: 0 10*3/uL (ref 0.0–0.1)
Basophils Relative: 0.9 % (ref 0.0–3.0)
Eosinophils Absolute: 0.1 10*3/uL (ref 0.0–0.7)
Eosinophils Relative: 2.2 % (ref 0.0–5.0)
HEMATOCRIT: 39.9 % (ref 36.0–46.0)
HEMOGLOBIN: 13.5 g/dL (ref 12.0–15.0)
LYMPHS ABS: 0.9 10*3/uL (ref 0.7–4.0)
Lymphocytes Relative: 19.4 % (ref 12.0–46.0)
MCHC: 33.9 g/dL (ref 30.0–36.0)
MCV: 88.8 fl (ref 78.0–100.0)
MONOS PCT: 9.5 % (ref 3.0–12.0)
Monocytes Absolute: 0.5 10*3/uL (ref 0.1–1.0)
NEUTROS ABS: 3.3 10*3/uL (ref 1.4–7.7)
Neutrophils Relative %: 68 % (ref 43.0–77.0)
Platelets: 197 10*3/uL (ref 150.0–400.0)
RBC: 4.49 Mil/uL (ref 3.87–5.11)
RDW: 13.5 % (ref 11.5–15.5)
WBC: 4.9 10*3/uL (ref 4.0–10.5)

## 2014-01-16 LAB — HEPATIC FUNCTION PANEL
ALBUMIN: 3.9 g/dL (ref 3.5–5.2)
ALK PHOS: 107 U/L (ref 39–117)
ALT: 21 U/L (ref 0–35)
AST: 23 U/L (ref 0–37)
BILIRUBIN DIRECT: 0.1 mg/dL (ref 0.0–0.3)
Total Bilirubin: 0.6 mg/dL (ref 0.2–1.2)
Total Protein: 6.8 g/dL (ref 6.0–8.3)

## 2014-01-16 LAB — TSH: TSH: 1.09 u[IU]/mL (ref 0.35–4.50)

## 2014-01-16 NOTE — Patient Instructions (Signed)
Diet and Irritable Bowel Syndrome  No cure has been found for irritable bowel syndrome (IBS). Many options are available to treat the symptoms. Your caregiver will give you the best treatments available for your symptoms. He or she will also encourage you to manage stress and to make changes to your diet. You need to work with your caregiver and Registered Dietician to find the best combination of medicine, diet, counseling, and support to control your symptoms. The following are some diet suggestions. FOODS THAT MAKE IBS WORSE  Fatty foods, such as [place] fries.  Milk products, such as cheese or ice cream.  Chocolate.  Alcohol.  Caffeine (found in coffee and some sodas).  Carbonated drinks, such as soda. If certain foods cause symptoms, you should eat less of them or stop eating them. FOOD JOURNAL   Keep a journal of the foods that seem to cause distress. Write down:  What you are eating during the day and when.  What problems you are having after eating.  When the symptoms occur in relation to your meals.  What foods always make you feel badly.  Take your notes with you to your caregiver to see if you should stop eating certain foods. FOODS THAT MAKE IBS BETTER Fiber reduces IBS symptoms, especially constipation, because it makes stools soft, bulky, and easier to pass. Fiber is found in bran, bread, cereal, beans, fruit, and vegetables. Examples of foods with fiber include:  Apples.  Peaches.  Pears.  Berries.  Figs.  Broccoli, raw.  Cabbage.  Carrots.  Raw peas.  Kidney beans.  Lima beans.  Whole-grain bread.  Whole-grain cereal. Add foods with fiber to your diet a little at a time. This will let your body get used to them. Too much fiber at once might cause gas and swelling of your abdomen. This can trigger symptoms in a person with IBS. Caregivers usually recommend a diet with enough fiber to produce soft, painless bowel movements. High fiber diets may  cause gas and bloating. However, these symptoms often go away within a few weeks, as your body adjusts. In many cases, dietary fiber may lessen IBS symptoms, particularly constipation. However, it may not help pain or diarrhea. High fiber diets keep the colon mildly enlarged (distended) with the added fiber. This may help prevent spasms in the colon. Some forms of fiber also keep water in the stool, thereby preventing hard stools that are difficult to pass.  Besides telling you to eat more foods with fiber, your caregiver may also tell you to get more fiber by taking a fiber pill or drinking water mixed with a special high fiber powder. An example of this is a natural fiber laxative containing psyllium seed.  TIPS  Large meals can cause cramping and diarrhea in people with IBS. If this happens to you, try eating 4 or 5 small meals a day, or try eating less at each of your usual 3 meals. It may also help if your meals are low in fat and high in carbohydrates. Examples of carbohydrates are pasta, rice, whole-grain breads and cereals, fruits, and vegetables.  If dairy products cause your symptoms to flare up, you can try eating less of those foods. You might be able to handle yogurt better than other dairy products, because it contains bacteria that helps with digestion. Dairy products are an important source of calcium and other nutrients. If you need to avoid dairy products, be sure to talk with a Registered Dietitian about getting these nutrients  through other food sources.  Drink enough water and fluids to keep your urine clear or pale yellow. This is important, especially if you have diarrhea. FOR MORE INFORMATION  International Foundation for Functional Gastrointestinal Disorders: www.iffgd.org  National Digestive Diseases Information Clearinghouse: digestive.AmenCredit.is Document Released: 09/04/2003 Document Revised: 09/06/2011 Document Reviewed: 05/22/2007 [facility] Patient Information 2015  [place], [place]. This information is not intended to replace advice given to you by your health care provider. Make sure you discuss any questions you have with your health care provider.

## 2014-01-16 NOTE — Progress Notes (Signed)
Pre visit review using our clinic review tool, if applicable. No additional management support is needed unless otherwise documented below in the visit note. 

## 2014-01-16 NOTE — Progress Notes (Signed)
Subjective:    Patient ID: Gina Weber, female    DOB: 01-20-1946, 68 y.o.   MRN: [id number]  HPI 68 year old white female, nonsmoker, with a history of hypertension, hypercholesterolemia, anxiety, and hypothyroidism. She has concerns today of loose stools over the last 3 weeks. She has a history of IBS. Reports stools are foul. Stool is 4-5 times a week 1-2 times a day. She has intermittent associated  Left lower quadrant pain.   Reports increased stress.   Also reports elevated blood glucose. No diagnosis of DM.    Review of Systems  Constitutional: Negative.   Respiratory: Negative.   Cardiovascular: Negative.   Gastrointestinal: Negative for nausea.       Loose stool  Endocrine: Negative.   Genitourinary: Negative.   Musculoskeletal: Negative.   Skin: Negative.   Neurological: Negative.   Psychiatric/Behavioral: Negative.    Past Medical History  Diagnosis Date  . Fibromyalgia   . Arthritis   . Allergy   . Hyperlipidemia   . Hypertension   . GERD (gastroesophageal reflux disease)   . Depression   . COPD (chronic obstructive pulmonary disease)   . Anxiety   . Diverticulosis   . Breast cancer 1987 & 2006    radiation therapy  . Thyroid disease   . Osteopenia 06/12/2013    History   Social History  . Marital Status: Married    Spouse Name: N/A    Number of Children: N/A  . Years of Education: N/A   Occupational History  . Not on file.   Social History Main Topics  . Smoking status: Never Smoker   . Smokeless tobacco: Never Used  . Alcohol Use: No  . Drug Use: No  . Sexual Activity: Not on file   Other Topics Concern  . Not on file   Social History Narrative  . No narrative on file    Past Surgical History  Procedure Laterality Date  . Abdominal hysterectomy    . Mastectomy  1987    bilateral f/u reconstruction  . Cataract extraction      left eye  . Appendectomy      Family History  Problem Relation Age of Onset  . Cancer Mother 32    esophageal   . Aneurysm Father     AAA  . Heart disease Father   . Depression Son     bipolar  . Colon cancer Neg Hx     Allergies  Allergen Reactions  . Seroquel [Quetiapine Fumarate] Anaphylaxis  . Pravastatin Sodium     REACTION: liver enzyme abnormality---per pt's report  . Statins     REACTION: leg pain, elevates liver enzymes  . Sulfamethoxazole     REACTION: hives    Current Outpatient Prescriptions on File Prior to Visit  Medication Sig Dispense Refill  . ALPRAZolam (XANAX) 0.5 MG tablet Take 1 tablet (0.5 mg total) by mouth at bedtime as needed. For sleep  90 tablet  0  . amitriptyline (ELAVIL) 10 MG tablet Take 2 tablets (20 mg total) by mouth at bedtime.  180 tablet  0  . amLODipine (NORVASC) 10 MG tablet Take 1 tablet (10 mg total) by mouth daily.  90 tablet  1  . DULoxetine (CYMBALTA) 30 MG capsule Take 2 capsules (60 mg total) by mouth daily.  180 capsule  0  . esomeprazole (NEXIUM) 40 MG capsule Take 1 capsule (40 mg total) by mouth daily.  90 capsule  3  . Fluticasone-Salmeterol (ADVAIR DISKUS)  100-50 MCG/DOSE AEPB Inhale 1 puff into the lungs every 12 (twelve) hours.  180 each  3  . HYDROcodone-acetaminophen (NORCO) 10-325 MG per tablet Take 0.5-1 tablets by mouth every 8 (eight) hours as needed for pain.  30 tablet  3  . levothyroxine (LEVOTHROID) 25 MCG tablet Take 1 tablet (25 mcg total) by mouth daily.  90 tablet  1  . losartan (COZAAR) 100 MG tablet Take 1 tablet (100 mg total) by mouth daily.  90 tablet  1  . meloxicam (MOBIC) 7.5 MG tablet Take 1 tablet (7.5 mg total) by mouth daily.  90 tablet  0  . montelukast (SINGULAIR) 10 MG tablet Take 1 tablet (10 mg total) by mouth at bedtime.  90 tablet  1  . Pitavastatin Calcium 2 MG TABS Take 1 tablet (2 mg total) by mouth daily.  90 tablet  0  . potassium chloride (K-DUR) 10 MEQ tablet Take 1 tablet (10 mEq total) by mouth 2 (two) times daily.  60 tablet  2  . albuterol (PROAIR HFA) 108 (90 BASE) MCG/ACT inhaler  Inhale 2 puffs into the lungs every 6 (six) hours as needed for wheezing.  18 g  5   No current facility-administered medications on file prior to visit.    BP 122/72  Pulse 83  Ht 5\' 5"  (1.651 m)  Wt 162 lb (73.483 kg)  BMI 26.96 kg/m2chart    Objective:   Physical Exam  Constitutional: She is oriented to person, place, and time. She appears well-developed and well-nourished.  Neck: Normal range of motion. Neck supple.  Cardiovascular: Normal rate, regular rhythm and normal heart sounds.   Pulmonary/Chest: Effort normal and breath sounds normal.  Abdominal: Soft. Bowel sounds are normal.  Musculoskeletal: Normal range of motion.  Neurological: She is alert and oriented to person, place, and time.  Skin: Skin is warm and dry.  Psychiatric: She has a normal mood and affect.          Assessment & Plan:  Gina Weber was seen today for diarrhea.  Diagnoses and associated orders for this visit:  Irritable bowel syndrome  Diarrhea - Basic Metabolic Panel - Hepatic Function Panel - CBC with Differential - TSH  Hyperglycemia - TSH  Unspecified essential hypertension - Basic Metabolic Panel - TSH   Call the office with any questions or concerns. Recheck as scheduled

## 2014-01-20 ENCOUNTER — Encounter: Payer: Self-pay | Admitting: Family

## 2014-01-21 ENCOUNTER — Other Ambulatory Visit: Payer: Self-pay

## 2014-01-21 MED ORDER — LEVOTHYROXINE SODIUM 25 MCG PO TABS: 25.0000 ug | ORAL_TABLET | Freq: Every day | ORAL | Status: AC

## 2014-01-21 MED ORDER — ALPRAZOLAM 0.5 MG PO TABS: 0.5000 mg | ORAL_TABLET | Freq: Every evening | ORAL | Status: DC | PRN

## 2014-01-21 MED ORDER — DULOXETINE HCL 30 MG PO CPEP: 60.0000 mg | ORAL_CAPSULE | Freq: Every day | ORAL | Status: DC

## 2014-01-21 MED ORDER — MONTELUKAST SODIUM 10 MG PO TABS: 10.0000 mg | ORAL_TABLET | Freq: Every day | ORAL | Status: DC

## 2014-01-21 MED ORDER — PITAVASTATIN CALCIUM 2 MG PO TABS: 2.0000 mg | ORAL_TABLET | Freq: Every day | ORAL | Status: DC

## 2014-01-21 MED ORDER — MELOXICAM 7.5 MG PO TABS: 7.5000 mg | ORAL_TABLET | Freq: Every day | ORAL | Status: DC

## 2014-01-21 MED ORDER — ESOMEPRAZOLE MAGNESIUM 40 MG PO CPDR: 40.0000 mg | DELAYED_RELEASE_CAPSULE | Freq: Every day | ORAL | Status: DC

## 2014-01-21 MED ORDER — AMLODIPINE BESYLATE 10 MG PO TABS: 10.0000 mg | ORAL_TABLET | Freq: Every day | ORAL | Status: DC

## 2014-01-21 MED ORDER — FLUTICASONE-SALMETEROL 100-50 MCG/DOSE IN AEPB: 1.0000 | INHALATION_SPRAY | Freq: Two times a day (BID) | RESPIRATORY_TRACT | Status: DC

## 2014-01-21 MED ORDER — AMITRIPTYLINE HCL 10 MG PO TABS: 20.0000 mg | ORAL_TABLET | Freq: Every day | ORAL | Status: AC

## 2014-01-21 MED ORDER — LOSARTAN POTASSIUM 100 MG PO TABS: 100.0000 mg | ORAL_TABLET | Freq: Every day | ORAL | Status: DC

## 2014-01-21 MED ORDER — ALBUTEROL SULFATE HFA 108 (90 BASE) MCG/ACT IN AERS: 2.0000 | INHALATION_SPRAY | Freq: Four times a day (QID) | RESPIRATORY_TRACT | Status: DC | PRN

## 2014-01-22 ENCOUNTER — Encounter: Payer: Self-pay | Admitting: Family

## 2014-01-23 ENCOUNTER — Encounter: Payer: Self-pay | Admitting: Family

## 2014-04-05 ENCOUNTER — Ambulatory Visit: Payer: Medicare Other

## 2014-04-17 ENCOUNTER — Telehealth: Payer: Self-pay | Admitting: Family

## 2014-04-17 NOTE — Telephone Encounter (Signed)
[place] is requesting re-fill on potassium chloride (K-DUR) 10 MEQ tablet

## 2014-04-18 MED ORDER — POTASSIUM CHLORIDE ER 10 MEQ PO TBCR: 10.0000 meq | EXTENDED_RELEASE_TABLET | Freq: Two times a day (BID) | ORAL | Status: DC

## 2014-04-18 NOTE — Telephone Encounter (Signed)
Done

## 2014-04-22 ENCOUNTER — Telehealth: Payer: Self-pay | Admitting: Family

## 2014-04-22 MED ORDER — ALPRAZOLAM 0.5 MG PO TABS: 0.5000 mg | ORAL_TABLET | Freq: Every evening | ORAL | Status: DC | PRN

## 2014-04-22 NOTE — Telephone Encounter (Signed)
Done

## 2014-04-22 NOTE — Telephone Encounter (Signed)
Gina Weber is requesting re-fill on ALPRAZolam (XANAX) 0.5 MG tablet

## 2014-05-22 ENCOUNTER — Telehealth: Payer: Self-pay | Admitting: Family

## 2014-05-22 NOTE — Telephone Encounter (Signed)
Pt advised a refill was sent in 04/22/14 for #90. She should call pharmacy

## 2014-05-22 NOTE — Telephone Encounter (Signed)
[place] [phone] is requesting re-fill on ALPRAZolam (XANAX) 0.5 MG tablet

## 2014-05-30 ENCOUNTER — Telehealth: Payer: Self-pay | Admitting: Hematology and Oncology

## 2014-05-30 ENCOUNTER — Telehealth: Payer: Self-pay

## 2014-05-30 NOTE — Telephone Encounter (Signed)
pt called to cx....she feels that she did not need to come back for ck-up

## 2014-05-30 NOTE — Telephone Encounter (Signed)
Refill: Alprazolam 0.5mg  tablet- Take one tablet by mouth at bedtime as needed for sleep #90  [facility], [place]

## 2014-05-31 NOTE — Telephone Encounter (Signed)
Request too soon.  Refill denied

## 2014-06-03 ENCOUNTER — Ambulatory Visit: Payer: Self-pay | Admitting: Family

## 2014-06-06 NOTE — Telephone Encounter (Signed)
Re-fill request received for the below medication.  Is it still too soon to fill??

## 2014-06-07 NOTE — Telephone Encounter (Signed)
Fax sent back to pharmacy with next re-fill date.

## 2014-06-07 NOTE — Telephone Encounter (Signed)
Refill too soon. Unable to fill until 07/23/2014

## 2014-06-24 ENCOUNTER — Encounter: Payer: Self-pay | Admitting: Hematology and Oncology

## 2014-07-08 ENCOUNTER — Ambulatory Visit (INDEPENDENT_AMBULATORY_CARE_PROVIDER_SITE_OTHER): Payer: Medicare Other | Admitting: Family

## 2014-07-08 ENCOUNTER — Encounter: Payer: Self-pay | Admitting: Family

## 2014-07-08 ENCOUNTER — Ambulatory Visit: Payer: Self-pay | Admitting: Family

## 2014-07-08 ENCOUNTER — Other Ambulatory Visit: Payer: Self-pay | Admitting: Family

## 2014-07-08 VITALS — BP 132/64 | HR 94 | Temp 98.6°F | Ht 65.0 in | Wt 156.0 lb

## 2014-07-08 DIAGNOSIS — J449 Chronic obstructive pulmonary disease, unspecified: Secondary | ICD-10-CM

## 2014-07-08 DIAGNOSIS — I10 Essential (primary) hypertension: Secondary | ICD-10-CM

## 2014-07-08 DIAGNOSIS — IMO0001 Reserved for inherently not codable concepts without codable children: Secondary | ICD-10-CM

## 2014-07-08 DIAGNOSIS — E039 Hypothyroidism, unspecified: Secondary | ICD-10-CM

## 2014-07-08 DIAGNOSIS — K219 Gastro-esophageal reflux disease without esophagitis: Secondary | ICD-10-CM

## 2014-07-08 DIAGNOSIS — E785 Hyperlipidemia, unspecified: Secondary | ICD-10-CM

## 2014-07-08 LAB — BASIC METABOLIC PANEL
BUN: 17 mg/dL (ref 6–23)
CHLORIDE: 105 meq/L (ref 96–112)
CO2: 22 mEq/L (ref 19–32)
Calcium: 9.4 mg/dL (ref 8.4–10.5)
Creatinine, Ser: 0.8 mg/dL (ref 0.4–1.2)
GFR: 71.26 mL/min (ref >60.00)
Glucose, Bld: 89 mg/dL (ref 70–99)
POTASSIUM: 3.7 meq/L (ref 3.5–5.1)
SODIUM: 138 meq/L (ref 135–145)

## 2014-07-08 LAB — LIPID PANEL
Cholesterol: 287 mg/dL — ABNORMAL HIGH (ref 0–200)
HDL: 68 mg/dL (ref >39.00)
LDL CALC: 183 mg/dL — AB (ref 0–99)
NONHDL: 219
TRIGLYCERIDES: 181 mg/dL — AB (ref 0.0–149.0)
Total CHOL/HDL Ratio: 4
VLDL: 36.2 mg/dL (ref 0.0–40.0)

## 2014-07-08 LAB — POCT URINALYSIS DIPSTICK
Bilirubin, UA: NEGATIVE
Blood, UA: NEGATIVE
Glucose, UA: NEGATIVE
KETONES UA: NEGATIVE
Leukocytes, UA: NEGATIVE
Nitrite, UA: NEGATIVE
PH UA: 7
Protein, UA: NEGATIVE
Spec Grav, UA: 1.01
Urobilinogen, UA: 0.2

## 2014-07-08 LAB — HEPATIC FUNCTION PANEL
ALK PHOS: 123 U/L — AB (ref 39–117)
ALT: 28 U/L (ref 0–35)
AST: 22 U/L (ref 0–37)
Albumin: 4.4 g/dL (ref 3.5–5.2)
BILIRUBIN TOTAL: 0.5 mg/dL (ref 0.2–1.2)
Bilirubin, Direct: 0 mg/dL (ref 0.0–0.3)
TOTAL PROTEIN: 7.6 g/dL (ref 6.0–8.3)

## 2014-07-08 LAB — CBC WITH DIFFERENTIAL/PLATELET
BASOS ABS: 0 10*3/uL (ref 0.0–0.1)
Basophils Relative: 0 % (ref 0.0–3.0)
EOS ABS: 0 10*3/uL (ref 0.0–0.7)
Eosinophils Relative: 0.2 % (ref 0.0–5.0)
HCT: 43.4 % (ref 36.0–46.0)
Hemoglobin: 14.5 g/dL (ref 12.0–15.0)
Lymphocytes Relative: 4.2 % — ABNORMAL LOW (ref 12.0–46.0)
Lymphs Abs: 0.5 10*3/uL — ABNORMAL LOW (ref 0.7–4.0)
MCHC: 33.4 g/dL (ref 30.0–36.0)
MCV: 88.8 fl (ref 78.0–100.0)
MONOS PCT: 7.3 % (ref 3.0–12.0)
Monocytes Absolute: 0.9 10*3/uL (ref 0.1–1.0)
Neutro Abs: 11 10*3/uL — ABNORMAL HIGH (ref 1.4–7.7)
Neutrophils Relative %: 88.3 % — ABNORMAL HIGH (ref 43.0–77.0)
PLATELETS: 237 10*3/uL (ref 150.0–400.0)
RBC: 4.89 Mil/uL (ref 3.87–5.11)
RDW: 14 % (ref 11.5–15.5)
WBC: 12.5 10*3/uL — ABNORMAL HIGH (ref 4.0–10.5)

## 2014-07-08 LAB — TSH: TSH: 0.74 u[IU]/mL (ref 0.35–4.50)

## 2014-07-08 MED ORDER — DOXYCYCLINE HYCLATE 100 MG PO TABS: 100.0000 mg | ORAL_TABLET | Freq: Two times a day (BID) | ORAL | Status: DC

## 2014-07-08 MED ORDER — ALPRAZOLAM 0.5 MG PO TABS: 0.5000 mg | ORAL_TABLET | Freq: Every evening | ORAL | Status: DC | PRN

## 2014-07-08 NOTE — Patient Instructions (Signed)
  Grief Reaction Grief is a normal response to the death of someone close to you. Feelings of fear, anger, and guilt can affect almost everyone who loses someone they love. Symptoms of depression are also common. These include problems with sleep, loss of appetite, and lack of energy. These grief reaction symptoms often last for weeks to months after a loss. They may also return during special times that remind you of the person you lost, such as an anniversary or birthday. Anxiety, insomnia, irritability, and deep depression may last beyond the period of normal grief. If you experience these feelings for 6 months or longer, you may have clinical depression. Clinical depression requires further medical attention. If you think that you have clinical depression, you should contact your caregiver. If you have a history of depression or a family history of depression, you are at greater risk of clinical depression. You are also at greater risk of developing clinical depression if the loss was traumatic or the loss was of someone with whom you had unresolved issues.  A grief reaction can become complicated by being blocked. This means being unable to cry or express extreme emotions. This may prolong the grieving period and worsen the emotional effects of the loss. Mourning is a natural event in human life. A healthy grief reaction is one that is not blocked. It requires a time of sadness and readjustment. It is very important to share your sorrow and fear with others, especially close friends and family. Professional counselors and clergy can also help you process your grief. Document Released: 06/14/2005 Document Revised: 10/29/2013 Document Reviewed: 02/22/2006 [facility] Patient Information 2015 [place], [place]. This information is not intended to replace advice given to you by your health care provider. Make sure you discuss any questions you have with your health care provider.

## 2014-07-08 NOTE — Progress Notes (Signed)
Pre visit review using our clinic review tool, if applicable. No additional management support is needed unless otherwise documented below in the visit note. 

## 2014-07-08 NOTE — Progress Notes (Signed)
Subjective:    Patient ID: Gina Weber, female    DOB: 1946/06/11, 70 y.o.   MRN: [id number]  HPI  69 year old white female, nonsmoker with a history of hypothyroidism, hypercholesterolemia, anxiety, depression, hypertension is in today for recheck. Reports doing well overall. Her husband passed away 2 weeks ago. He's been chronically ill. Reports she's coping well. Has a soft support system of her son and friends. Continues to be down and depressed about her daughter not attending the service. I currently exercising but attempting to follow healthy diet.  Review of Systems  Constitutional: Negative.   HENT: Negative.   Respiratory: Negative.   Cardiovascular: Negative.   Gastrointestinal: Negative.   Endocrine: Negative.   Genitourinary: Negative.   Musculoskeletal: Negative.   Allergic/Immunologic: Negative.   Neurological: Negative.   Psychiatric/Behavioral: Positive for sleep disturbance. The patient is nervous/anxious.        Takes Xanax at bedtime to help her sleep  All other systems reviewed and are negative.  Past Medical History  Diagnosis Date  . Fibromyalgia   . Arthritis   . Allergy   . Hyperlipidemia   . Hypertension   . GERD (gastroesophageal reflux disease)   . Depression   . COPD (chronic obstructive pulmonary disease)   . Anxiety   . Diverticulosis   . Breast cancer 1987 & 2006    radiation therapy  . Thyroid disease   . Osteopenia 06/12/2013    History   Social History  . Marital Status: Married    Spouse Name: N/A    Number of Children: N/A  . Years of Education: N/A   Occupational History  . Not on file.   Social History Main Topics  . Smoking status: Never Smoker   . Smokeless tobacco: Never Used  . Alcohol Use: No  . Drug Use: No  . Sexual Activity: Not on file   Other Topics Concern  . Not on file   Social History Narrative    Past Surgical History  Procedure Laterality Date  . Abdominal hysterectomy    . Mastectomy  1987    bilateral f/u reconstruction  . Cataract extraction      left eye  . Appendectomy      Family History  Problem Relation Age of Onset  . Cancer Mother 28    esophageal   . Aneurysm Father     AAA  . Heart disease Father   . Depression Son     bipolar  . Colon cancer Neg Hx     Allergies  Allergen Reactions  . Seroquel [Quetiapine Fumarate] Anaphylaxis  . Pravastatin Sodium     REACTION: liver enzyme abnormality---per pt's report  . Statins     REACTION: leg pain, elevates liver enzymes  . Sulfamethoxazole     REACTION: hives    Current Outpatient Prescriptions on File Prior to Visit  Medication Sig Dispense Refill  . albuterol (PROAIR HFA) 108 (90 BASE) MCG/ACT inhaler Inhale 2 puffs into the lungs every 6 (six) hours as needed for wheezing. 18 g 5  . amitriptyline (ELAVIL) 10 MG tablet Take 2 tablets (20 mg total) by mouth at bedtime. 180 tablet 1  . amLODipine (NORVASC) 10 MG tablet Take 1 tablet (10 mg total) by mouth daily. 90 tablet 1  . DULoxetine (CYMBALTA) 30 MG capsule Take 2 capsules (60 mg total) by mouth daily. 180 capsule 1  . esomeprazole (NEXIUM) 40 MG capsule Take 1 capsule (40 mg total) by mouth daily. 90 capsule  3  . Fluticasone-Salmeterol (ADVAIR DISKUS) 100-50 MCG/DOSE AEPB Inhale 1 puff into the lungs every 12 (twelve) hours. 180 each 3  . HYDROcodone-acetaminophen (NORCO) 10-325 MG per tablet Take 0.5-1 tablets by mouth every 8 (eight) hours as needed for pain. 30 tablet 3  . levothyroxine (LEVOTHROID) 25 MCG tablet Take 1 tablet (25 mcg total) by mouth daily. 90 tablet 1  . losartan (COZAAR) 100 MG tablet Take 1 tablet (100 mg total) by mouth daily. 90 tablet 1  . meloxicam (MOBIC) 7.5 MG tablet Take 1 tablet (7.5 mg total) by mouth daily. 90 tablet 1  . montelukast (SINGULAIR) 10 MG tablet Take 1 tablet (10 mg total) by mouth at bedtime. 90 tablet 1  . Pitavastatin Calcium 2 MG TABS Take 1 tablet (2 mg total) by mouth daily. 90 tablet 1  . potassium  chloride (K-DUR) 10 MEQ tablet Take 1 tablet (10 mEq total) by mouth 2 (two) times daily. 60 tablet 2   No current facility-administered medications on file prior to visit.    BP 132/64 mmHg  Pulse 94  Temp(Src) 98.6 F (37 C) (Oral)  Ht 5\' 5"  (1.651 m)  Wt 156 lb (70.761 kg)  BMI 25.96 kg/m2  SpO2 96%chart    Objective:   Physical Exam  Constitutional: She is oriented to person, place, and time. She appears well-developed and well-nourished.  HENT:  Right Ear: External ear normal.  Left Ear: External ear normal.  Nose: Nose normal.  Mouth/Throat: Oropharynx is clear and moist.  Neck: Normal range of motion. Neck supple. No thyromegaly present.  Cardiovascular: Normal rate and normal heart sounds.   Pulmonary/Chest: Effort normal.  Coarse breath sounds  Abdominal: Soft. Bowel sounds are normal.  Musculoskeletal: Normal range of motion.  Neurological: She is alert and oriented to person, place, and time.  Skin: Skin is warm and dry.  Psychiatric: She has a normal mood and affect.          Assessment & Plan:  Gina Weber was seen today for follow-up.  Diagnoses and associated orders for this visit:  Hyperlipemia - Lipid Panel  COPD bronchitis - CBC with Differential  Essential hypertension - Basic metabolic panel - Hepatic Function Panel - POCT urinalysis dipstick  Gastroesophageal reflux disease without esophagitis  Hypothyroidism, unspecified hypothyroidism type - TSH - CBC with Differential  Other Orders - ALPRAZolam (XANAX) 0.5 MG tablet; Take 1 tablet (0.5 mg total) by mouth at bedtime as needed. For sleep    Continue current medications. Obtain labs. Consider counseling increased therapy. Follow-up in 4-5 months and sooner as needed. We'll call her and advised pneumonia and influenza vaccine.

## 2014-07-09 ENCOUNTER — Encounter: Payer: Self-pay | Admitting: Family

## 2014-07-10 ENCOUNTER — Other Ambulatory Visit: Payer: Self-pay | Admitting: Family

## 2014-07-10 DIAGNOSIS — R0602 Shortness of breath: Secondary | ICD-10-CM

## 2014-07-12 ENCOUNTER — Ambulatory Visit (INDEPENDENT_AMBULATORY_CARE_PROVIDER_SITE_OTHER): Payer: Medicare Other | Admitting: Adult Health

## 2014-07-12 ENCOUNTER — Encounter: Payer: Self-pay | Admitting: Adult Health

## 2014-07-12 VITALS — BP 142/78 | HR 74 | Ht 65.0 in | Wt 156.0 lb

## 2014-07-12 DIAGNOSIS — J438 Other emphysema: Secondary | ICD-10-CM

## 2014-07-12 DIAGNOSIS — I10 Essential (primary) hypertension: Secondary | ICD-10-CM

## 2014-07-12 NOTE — Assessment & Plan Note (Signed)
Shortness of breath is likely related to her COPD.  She does not smoke or [place] is around secondhand smoke.  Going to refer her to pulmonology for further evaluation and treatment and need for changes in medication or other testing.  For now.  Her primary care physician has placed her on albuterol and Advair.she is requesting to be seen by a pulmonologist.  I will refer her to Dr. Luan Pulling.

## 2014-07-12 NOTE — Patient Instructions (Addendum)
Your physician wants you to follow-up in: 6 months with Jory Sims NP You will receive a reminder letter in the mail two months in advance. If you don't receive a letter, please call our office to schedule the follow-up appointment.    Your physician recommends that you continue on your current medications as directed. Please refer to the Current Medication list given to you today.      You have been referred to Dr.Edward [facility] pulmonologist [phone]      Thank you for choosing [facility] !

## 2014-07-12 NOTE — Progress Notes (Signed)
HPI: Gina Weber is a 69 year old patient to be established with Dr. Bronson Ing or Dr.Branch, formerly seen by Dr. Roosvelt Harps, that we follow for ongoing assessment and management of hypertension.  She was last seen in the office in April of 2015 with multiple somatic complaints.  She was stable from a cardiac standpoint.  Blood pressure was well controlled.  She is here today at her request for her shortness of breath.  Of note, she had cardiac catheterization in March of 2014 in the setting of chest pain and was found not to have any evidence of CAD, with a normal LV function.   He comes today having recently buried her husband last week.  He died after a long illness.  Her main complaint is complete exhaustion and shortness of breath.  Her family is advised to see [place] today to make sure this is not cardiac related.  She talks at length about her symptoms, along with her husband's recent illness, and death.  Allergies  Allergen Reactions  . Seroquel [Quetiapine Fumarate] Anaphylaxis  . Pravastatin Sodium     REACTION: liver enzyme abnormality---per pt's report  . Statins     REACTION: leg pain, elevates liver enzymes  . Sulfamethoxazole     REACTION: hives    Current Outpatient Prescriptions  Medication Sig Dispense Refill  . albuterol (PROAIR HFA) 108 (90 BASE) MCG/ACT inhaler Inhale 2 puffs into the lungs every 6 (six) hours as needed for wheezing. 18 g 5  . ALPRAZolam (XANAX) 0.5 MG tablet Take 1 tablet (0.5 mg total) by mouth at bedtime as needed. For sleep 90 tablet 0  . amitriptyline (ELAVIL) 10 MG tablet Take 2 tablets (20 mg total) by mouth at bedtime. 180 tablet 1  . amLODipine (NORVASC) 10 MG tablet Take 1 tablet (10 mg total) by mouth daily. 90 tablet 1  . doxycycline (VIBRA-TABS) 100 MG tablet Take 1 tablet (100 mg total) by mouth 2 (two) times daily. 20 tablet 0  . DULoxetine (CYMBALTA) 30 MG capsule Take 2 capsules (60 mg total) by mouth daily. 180 capsule 1  . esomeprazole  (NEXIUM) 40 MG capsule Take 1 capsule (40 mg total) by mouth daily. 90 capsule 3  . Fluticasone-Salmeterol (ADVAIR DISKUS) 100-50 MCG/DOSE AEPB Inhale 1 puff into the lungs every 12 (twelve) hours. 180 each 3  . HYDROcodone-acetaminophen (NORCO) 10-325 MG per tablet Take 0.5-1 tablets by mouth every 8 (eight) hours as needed for pain. 30 tablet 3  . levothyroxine (LEVOTHROID) 25 MCG tablet Take 1 tablet (25 mcg total) by mouth daily. 90 tablet 1  . losartan (COZAAR) 100 MG tablet Take 1 tablet (100 mg total) by mouth daily. 90 tablet 1  . meloxicam (MOBIC) 7.5 MG tablet Take 1 tablet (7.5 mg total) by mouth daily. 90 tablet 1  . montelukast (SINGULAIR) 10 MG tablet Take 1 tablet (10 mg total) by mouth at bedtime. 90 tablet 1  . Pitavastatin Calcium 2 MG TABS Take 1 tablet (2 mg total) by mouth daily. 90 tablet 1  . potassium chloride (K-DUR) 10 MEQ tablet Take 1 tablet (10 mEq total) by mouth 2 (two) times daily. 60 tablet 2   No current facility-administered medications for this visit.    Past Medical History  Diagnosis Date  . Fibromyalgia   . Arthritis   . Allergy   . Hyperlipidemia   . Hypertension   . GERD (gastroesophageal reflux disease)   . Depression   . COPD (chronic obstructive pulmonary disease)   .  Anxiety   . Diverticulosis   . Breast cancer 1987 & 2006    radiation therapy  . Thyroid disease   . Osteopenia 06/12/2013    Past Surgical History  Procedure Laterality Date  . Abdominal hysterectomy    . Mastectomy  1987    bilateral f/u reconstruction  . Cataract extraction      left eye  . Appendectomy      ROS: Complete review of systems performed and found to be negative unless outlined above  PHYSICAL EXAM There were no vitals taken for this visit.  General: Well developed, well nourished, in no acute distress Head: Eyes PERRLA, No xanthomas.   Normal cephalic and atramatic  Lungs: Clear bilaterally to auscultation and percussion. Heart: HRRR S1 S2,  without MRG.  Pulses are 2+ & equal.            No carotid bruit. No JVD.  No abdominal bruits. No femoral bruits. Abdomen: Bowel sounds are positive, abdomen soft and non-tender without masses or                  Hernia's noted. Msk:  Back normal, normal gait. Normal strength and tone for age. Extremities: No clubbing, cyanosis or edema.  DP +1 Neuro: Alert and oriented X 3. Psych:  Good affect, responds appropriately   EKG: NSR with rate of 71 bpm  ASSESSMENT AND PLAN

## 2014-07-12 NOTE — Assessment & Plan Note (Signed)
Blood pressure is currently well-controlled.  Will not make any changes in her medication regimen.

## 2014-07-12 NOTE — Assessment & Plan Note (Signed)
I think this is multiplied by the recent death of her husband.she is on medications for this and also for anxiety.  I have advised to seek every counseling to assist her during this time of morning.  Her sauces, likely related to this as well.  She is asking [place] to help with her son about this who is in the waiting room.  I brought him back to the clinic room and talked with both of them concerning her current health status . I also advised her son to support her and seeking out recalcitrance services.  They live in [place], [place], and are advised to seek out counseling services locally.

## 2014-07-12 NOTE — Progress Notes (Deleted)
Name: Gina Weber    DOB: [birth year]  Age: 69 y.o.  MR#: [id number]       PCP:  Donia Ast, FNP      Insurance: Payor: MEDICARE / Plan: MEDICARE PART A AND B / Product Type: *No Product type* /   CC:    Chief Complaint  Patient presents with  . Hypertension    VS Filed Vitals:   07/12/14 1353  BP: 142/78  Pulse: 74  Height: '5\' 5"'  (1.651 m)  Weight: 156 lb (70.761 kg)  SpO2: 96%    Weights Current Weight  07/12/14 156 lb (70.761 kg)  07/08/14 156 lb (70.761 kg)  01/16/14 162 lb (73.483 kg)    Blood Pressure  BP Readings from Last 3 Encounters:  07/12/14 142/78  07/08/14 132/64  01/16/14 122/72     Admit date:  (Not on file) Last encounter with RMR:  Visit date not found   Allergy Seroquel; Pravastatin sodium; Statins; and Sulfamethoxazole  Current Outpatient Prescriptions  Medication Sig Dispense Refill  . albuterol (PROAIR HFA) 108 (90 BASE) MCG/ACT inhaler Inhale 2 puffs into the lungs every 6 (six) hours as needed for wheezing. 18 g 5  . ALPRAZolam (XANAX) 0.5 MG tablet Take 1 tablet (0.5 mg total) by mouth at bedtime as needed. For sleep 90 tablet 0  . amitriptyline (ELAVIL) 10 MG tablet Take 2 tablets (20 mg total) by mouth at bedtime. 180 tablet 1  . amLODipine (NORVASC) 10 MG tablet Take 1 tablet (10 mg total) by mouth daily. 90 tablet 1  . doxycycline (VIBRA-TABS) 100 MG tablet Take 1 tablet (100 mg total) by mouth 2 (two) times daily. 20 tablet 0  . DULoxetine (CYMBALTA) 30 MG capsule Take 2 capsules (60 mg total) by mouth daily. 180 capsule 1  . esomeprazole (NEXIUM) 40 MG capsule Take 1 capsule (40 mg total) by mouth daily. 90 capsule 3  . Fluticasone-Salmeterol (ADVAIR DISKUS) 100-50 MCG/DOSE AEPB Inhale 1 puff into the lungs every 12 (twelve) hours. 180 each 3  . levothyroxine (LEVOTHROID) 25 MCG tablet Take 1 tablet (25 mcg total) by mouth daily. 90 tablet 1  . losartan (COZAAR) 100 MG tablet Take 1 tablet (100 mg total) by mouth daily. 90  tablet 1  . meloxicam (MOBIC) 7.5 MG tablet Take 1 tablet (7.5 mg total) by mouth daily. 90 tablet 1  . montelukast (SINGULAIR) 10 MG tablet Take 1 tablet (10 mg total) by mouth at bedtime. 90 tablet 1  . potassium chloride (K-DUR) 10 MEQ tablet Take 1 tablet (10 mEq total) by mouth 2 (two) times daily. 60 tablet 2   No current facility-administered medications for this visit.    Discontinued Meds:    Medications Discontinued During This Encounter  Medication Reason  . HYDROcodone-acetaminophen (NORCO) 10-325 MG per tablet Error  . Pitavastatin Calcium 2 MG TABS Error    Patient Active Problem List   Diagnosis Date Noted  . Osteopenia 06/12/2013  . Pure hypercholesterolemia 01/17/2013  . Chest pain 09/06/2012  . Anxiety 08/02/2012  . Hypothyroidism 04/01/2012  . RLS (restless legs syndrome) 12/29/2010  . DIVERTICULOSIS OF COLON 02/05/2010  . COPD 10/31/2009  . HYPERLIPIDEMIA 01/30/2008  . DEPRESSION 01/30/2008  . HYPERTENSION 11/27/2007  . ALLERGIC RHINITIS 11/27/2007  . GERD 11/27/2007  . BREAST CANCER, HX OF 11/27/2007    LABS    Component Value Date/Time   NA 138 07/08/2014 1106   NA 140 01/16/2014 1157   NA 141 09/12/2013 1017  K 3.7 07/08/2014 1106   K 3.8 01/16/2014 1157   K 3.4* 09/12/2013 1017   CL 105 07/08/2014 1106   CL 106 01/16/2014 1157   CL 104 09/12/2013 1017   CO2 22 07/08/2014 1106   CO2 26 01/16/2014 1157   CO2 27 09/12/2013 1017   GLUCOSE 89 07/08/2014 1106   GLUCOSE 87 01/16/2014 1157   GLUCOSE 92 09/12/2013 1017   BUN 17 07/08/2014 1106   BUN 7 01/16/2014 1157   BUN 8 09/12/2013 1017   CREATININE 0.8 07/08/2014 1106   CREATININE 0.8 01/16/2014 1157   CREATININE 0.8 09/12/2013 1017   CREATININE 0.73 09/06/2012 1341   CALCIUM 9.4 07/08/2014 1106   CALCIUM 9.5 01/16/2014 1157   CALCIUM 9.4 09/12/2013 1017   GFRNONAA 87* 07/14/2011 1517   GFRNONAA >60 10/15/2010 1349   GFRNONAA 81.01 04/03/2010 1207   GFRAA >90 07/14/2011 1517    GFRAA  10/15/2010 1349    >60        The eGFR has been calculated using the MDRD equation. This calculation has not been validated in all clinical situations. eGFR's persistently <60 mL/min signify possible Chronic Kidney Disease.   GFRAA 108 08/19/2008 1013   CMP     Component Value Date/Time   NA 138 07/08/2014 1106   K 3.7 07/08/2014 1106   CL 105 07/08/2014 1106   CO2 22 07/08/2014 1106   GLUCOSE 89 07/08/2014 1106   BUN 17 07/08/2014 1106   CREATININE 0.8 07/08/2014 1106   CREATININE 0.73 09/06/2012 1341   CALCIUM 9.4 07/08/2014 1106   PROT 7.6 07/08/2014 1106   ALBUMIN 4.4 07/08/2014 1106   AST 22 07/08/2014 1106   ALT 28 07/08/2014 1106   ALKPHOS 123* 07/08/2014 1106   BILITOT 0.5 07/08/2014 1106   GFRNONAA 87* 07/14/2011 1517   GFRAA >90 07/14/2011 1517       Component Value Date/Time   WBC 12.5* 07/08/2014 1106   WBC 4.9 01/16/2014 1157   WBC 6.1 09/06/2012 1341   HGB 14.5 07/08/2014 1106   HGB 13.5 01/16/2014 1157   HGB 13.7 09/06/2012 1341   HCT 43.4 07/08/2014 1106   HCT 39.9 01/16/2014 1157   HCT 40.0 09/06/2012 1341   MCV 88.8 07/08/2014 1106   MCV 88.8 01/16/2014 1157   MCV 86.8 09/06/2012 1341    Lipid Panel     Component Value Date/Time   CHOL 287* 07/08/2014 1106   TRIG 181.0* 07/08/2014 1106   HDL 68.00 07/08/2014 1106   CHOLHDL 4 07/08/2014 1106   VLDL 36.2 07/08/2014 1106   LDLCALC 183* 07/08/2014 1106   LDLDIRECT 181.8 03/02/2013 1123    ABG No results found for: PHART, PCO2ART, PO2ART, HCO3, TCO2, ACIDBASEDEF, O2SAT   Lab Results  Component Value Date   TSH 0.74 07/08/2014   BNP (last 3 results) No results for input(s): PROBNP in the last 8760 hours. Cardiac Panel (last 3 results) No results for input(s): CKTOTAL, CKMB, TROPONINI, RELINDX in the last 72 hours.  Iron/TIBC/Ferritin/ %Sat No results found for: IRON, TIBC, FERRITIN, IRONPCTSAT   EKG Orders placed or performed in visit on 10/05/12  . EKG 12-Lead      Prior Assessment and Plan Problem List as of 07/12/2014      Cardiovascular and Mediastinum   HYPERTENSION   Last Assessment & Plan 10/25/2013 Office Visit Written 10/25/2013  1:59 PM by Lendon Colonel, NP    Currently well-controlled. Would not make any changes in her medication. She will  continue amlodipine. I have reminded her that the amlodipine can cause some lower extremity puffiness which she has complained about his fall. Will not make any other changes to her antihypertensive regimen. Losartan will be continued as well.        Respiratory   ALLERGIC RHINITIS   COPD     Digestive   GERD   DIVERTICULOSIS OF COLON     Endocrine   Hypothyroidism     Musculoskeletal and Integument   Osteopenia     Other   HYPERLIPIDEMIA   Last Assessment & Plan 10/25/2013 Office Visit Written 10/25/2013  2:00 PM by Lendon Colonel, NP    Followup labs for primary care provider.      DEPRESSION   BREAST CANCER, HX OF   Last Assessment & Plan 07/30/2011 Office Visit Written 07/30/2011  9:40 AM by Lisabeth Pick, MD    Given hx of breast cancer and new onset thoracic pain I'll check an xray      RLS (restless legs syndrome)   Last Assessment & Plan 12/29/2010 Office Visit Written 12/29/2010 11:21 AM by Lisabeth Pick, MD    Reviewed sleep study Start mirapex --side effects discussed      Anxiety   Chest pain   Last Assessment & Plan 10/25/2013 Office Visit Written 10/25/2013  2:00 PM by Lendon Colonel, NP    She continues to have some occasional chest pain. She states that she is doing a lot of heavy lifting in yard work since her husband is ill. Catheterization was normal one year ago. 2 continue risk management. We will see her on a when necessary basis only.      Pure hypercholesterolemia       Imaging: No results found.

## 2014-07-16 ENCOUNTER — Telehealth: Payer: Self-pay | Admitting: Family

## 2014-07-16 NOTE — Telephone Encounter (Signed)
Pt following up on referral request  to dr ed Luan Pulling for COPD. In [place]. Pls advise

## 2014-07-19 ENCOUNTER — Ambulatory Visit: Payer: Self-pay | Admitting: Family

## 2014-07-22 ENCOUNTER — Other Ambulatory Visit: Payer: Self-pay

## 2014-07-22 MED ORDER — POTASSIUM CHLORIDE ER 10 MEQ PO TBCR: 10.0000 meq | EXTENDED_RELEASE_TABLET | Freq: Two times a day (BID) | ORAL | Status: DC

## 2014-07-22 MED ORDER — MELOXICAM 7.5 MG PO TABS: 7.5000 mg | ORAL_TABLET | Freq: Every day | ORAL | Status: AC

## 2014-07-24 ENCOUNTER — Ambulatory Visit: Payer: Medicare Other | Admitting: Family

## 2014-07-31 ENCOUNTER — Ambulatory Visit: Payer: Self-pay | Admitting: Internal Medicine

## 2014-08-01 ENCOUNTER — Other Ambulatory Visit: Payer: Self-pay

## 2014-08-01 MED ORDER — LOSARTAN POTASSIUM 100 MG PO TABS: 100.0000 mg | ORAL_TABLET | Freq: Every day | ORAL | Status: DC

## 2014-08-01 MED ORDER — DULOXETINE HCL 30 MG PO CPEP: 60.0000 mg | ORAL_CAPSULE | Freq: Every day | ORAL | Status: AC

## 2014-08-01 NOTE — Telephone Encounter (Signed)
Rx request for losartan potassium 100 mg tablets-Take 1 tablet by mouth daily #90

## 2014-08-01 NOTE — Telephone Encounter (Signed)
Rx request for Duloxetine HCL ER 30 mg-Take 2 capsules by mouth daily #60  Pharm:  [facility] advise.

## 2014-08-09 ENCOUNTER — Ambulatory Visit: Payer: Self-pay | Admitting: Hematology and Oncology

## 2014-08-30 ENCOUNTER — Other Ambulatory Visit (HOSPITAL_COMMUNITY): Payer: Self-pay | Admitting: Radiology

## 2014-08-30 DIAGNOSIS — J449 Chronic obstructive pulmonary disease, unspecified: Secondary | ICD-10-CM

## 2014-09-11 ENCOUNTER — Ambulatory Visit (HOSPITAL_COMMUNITY)
Admission: RE | Admit: 2014-09-11 | Discharge: 2014-09-11 | Disposition: A | Discharge status: Home / Self Care | Payer: Medicare Other | Source: Ambulatory Visit | Attending: Pulmonary Disease | Admitting: Pulmonary Disease

## 2014-09-11 DIAGNOSIS — J449 Chronic obstructive pulmonary disease, unspecified: Secondary | ICD-10-CM | POA: Diagnosis not present

## 2014-09-11 MED ORDER — ALBUTEROL SULFATE (2.5 MG/3ML) 0.083% IN NEBU
2.5000 mg | INHALATION_SOLUTION | Freq: Once | RESPIRATORY_TRACT | Status: AC
  Administered 2014-09-11: 2.5 mg via RESPIRATORY_TRACT

## 2014-09-22 LAB — PULMONARY FUNCTION TEST
DL/VA % pred: 110 %
DL/VA: 5.29 ml/min/mmHg/L
DLCO UNC % PRED: 95 %
DLCO UNC: 23.06 ml/min/mmHg
DLCO cor % pred: 95 %
DLCO cor: 23.06 ml/min/mmHg
FEF 25-75 Post: 1.65 L/sec
FEF 25-75 Pre: 1.21 L/sec
FEF2575-%Change-Post: 36 %
FEF2575-%Pred-Post: 86 %
FEF2575-%Pred-Pre: 63 %
FEV1-%Change-Post: 6 %
FEV1-%Pred-Post: 85 %
FEV1-%Pred-Pre: 80 %
FEV1-Post: 1.94 L
FEV1-Pre: 1.82 L
FEV1FVC-%Change-Post: 6 %
FEV1FVC-%Pred-Pre: 92 %
FEV6-%Change-Post: -1 %
FEV6-%PRED-PRE: 89 %
FEV6-%Pred-Post: 88 %
FEV6-POST: 2.52 L
FEV6-PRE: 2.57 L
FEV6FVC-%Change-Post: 0 %
FEV6FVC-%PRED-POST: 103 %
FEV6FVC-%PRED-PRE: 103 %
FVC-%CHANGE-POST: 0 %
FVC-%PRED-POST: 86 %
FVC-%PRED-PRE: 86 %
FVC-POST: 2.6 L
FVC-Pre: 2.59 L
PRE FEV6/FVC RATIO: 99 %
Post FEV1/FVC ratio: 75 %
Post FEV6/FVC ratio: 100 %
Pre FEV1/FVC ratio: 70 %
RV % pred: 107 %
RV: 2.36 L
TLC % PRED: 98 %
TLC: 4.97 L

## 2014-09-23 ENCOUNTER — Other Ambulatory Visit: Payer: Self-pay | Admitting: Family Medicine

## 2014-09-23 MED ORDER — MONTELUKAST SODIUM 10 MG PO TABS: 10.0000 mg | ORAL_TABLET | Freq: Every day | ORAL | Status: DC

## 2014-09-23 NOTE — Telephone Encounter (Signed)
I sent script e-scribe. 

## 2014-09-23 NOTE — Telephone Encounter (Signed)
Refill request for Montelukast 10 mg and send to [facility].

## 2014-09-24 ENCOUNTER — Other Ambulatory Visit: Payer: Self-pay | Admitting: Family Medicine

## 2014-09-24 MED ORDER — AMLODIPINE BESYLATE 10 MG PO TABS: 10.0000 mg | ORAL_TABLET | Freq: Every day | ORAL | Status: DC

## 2014-09-24 NOTE — Telephone Encounter (Signed)
Refill request for Amlodipine 10 mg take 1 po qd and send to [facility]. I sent script e-scribe.

## 2014-10-15 ENCOUNTER — Telehealth: Payer: Self-pay

## 2014-10-15 MED ORDER — ALPRAZOLAM 0.5 MG PO TABS: 0.5000 mg | ORAL_TABLET | Freq: Every evening | ORAL | Status: DC | PRN

## 2014-10-15 NOTE — Telephone Encounter (Signed)
Rx phoned in.   

## 2014-10-15 NOTE — Telephone Encounter (Signed)
Refill request for Xnanax 0.5mg  #90. Gina Weber

## 2014-10-31 ENCOUNTER — Other Ambulatory Visit: Payer: Self-pay

## 2014-10-31 MED ORDER — POTASSIUM CHLORIDE ER 10 MEQ PO TBCR: 10.0000 meq | EXTENDED_RELEASE_TABLET | Freq: Two times a day (BID) | ORAL | Status: AC

## 2014-11-08 ENCOUNTER — Telehealth: Payer: Self-pay

## 2014-11-08 NOTE — Telephone Encounter (Signed)
Home phone is not in service. Left message on cell to call back. I need to know if she was told she does not need another mammogram due to bilat mastectomy

## 2014-12-04 IMAGING — CR DG CHEST 2V
2 series · 2 of 2 positions shown · non-contrast
Comparison: 07/30/2011

CLINICAL DATA: Chest pain.

CHEST - 2 VIEW

[view not recorded (1 of 2)]
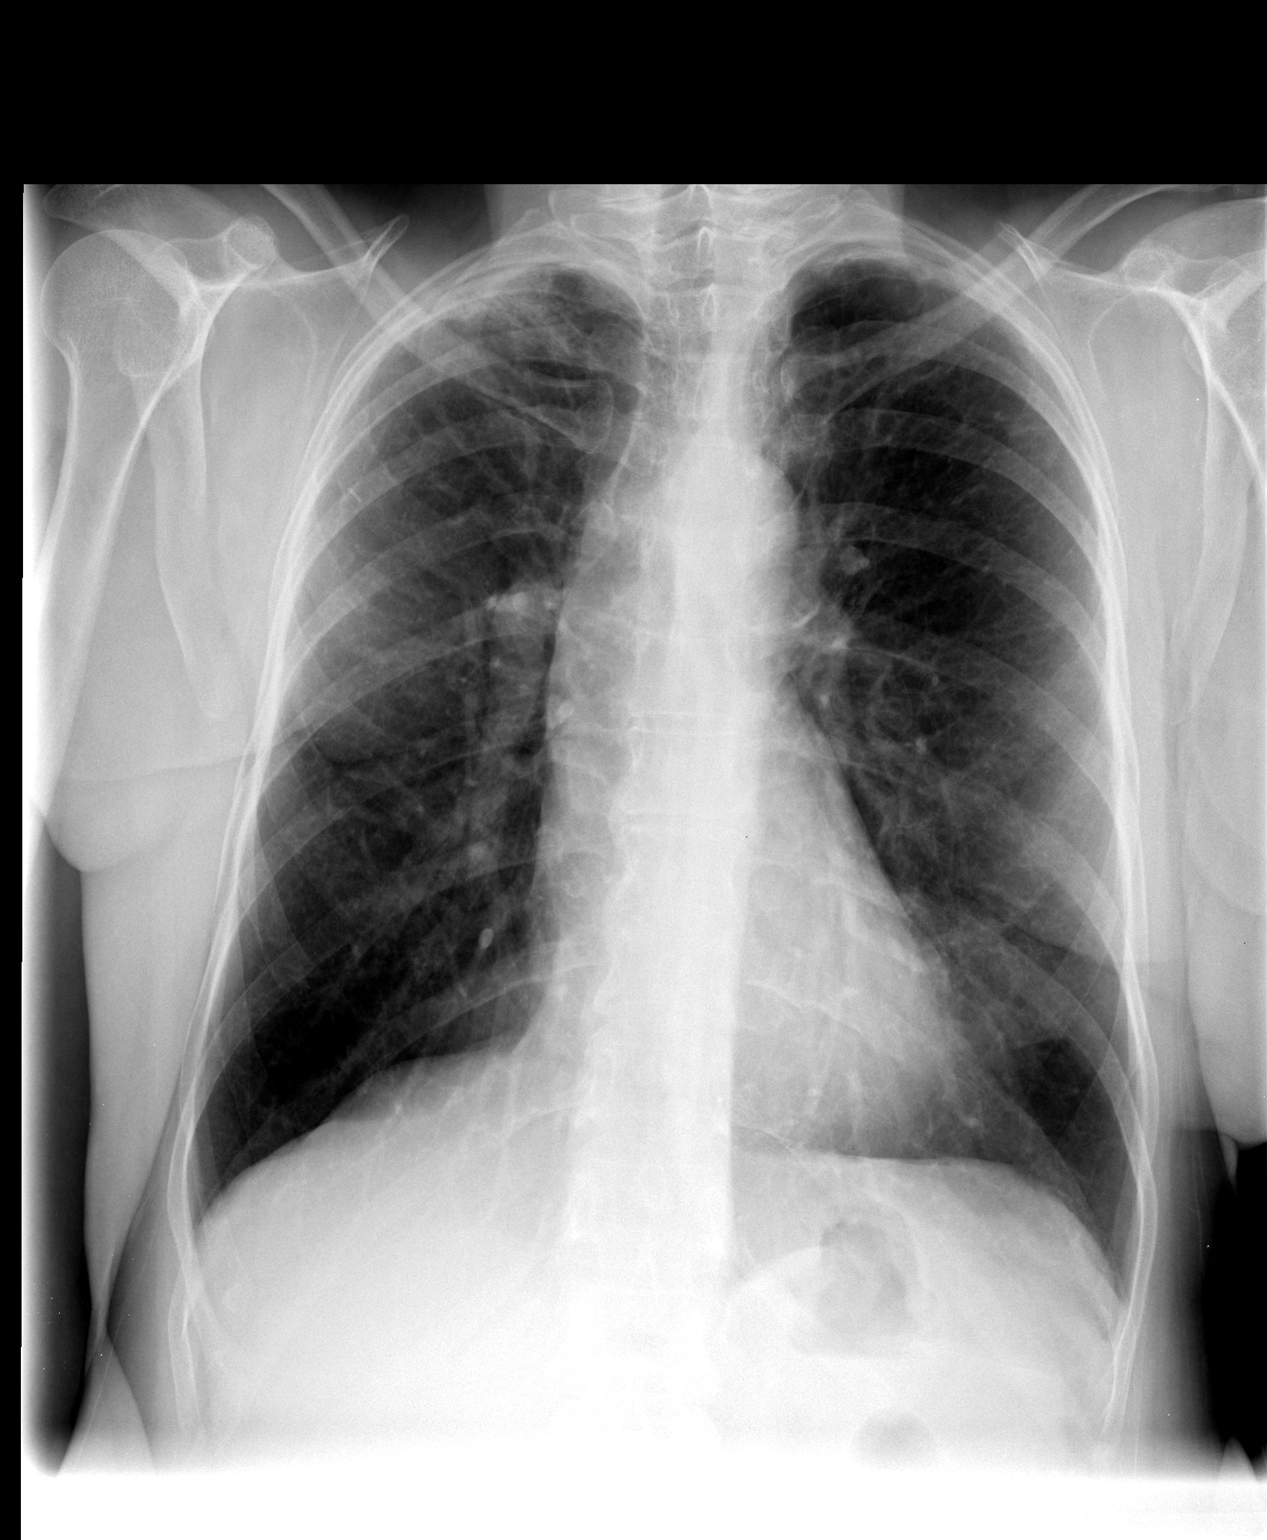

[view not recorded (2 of 2)]
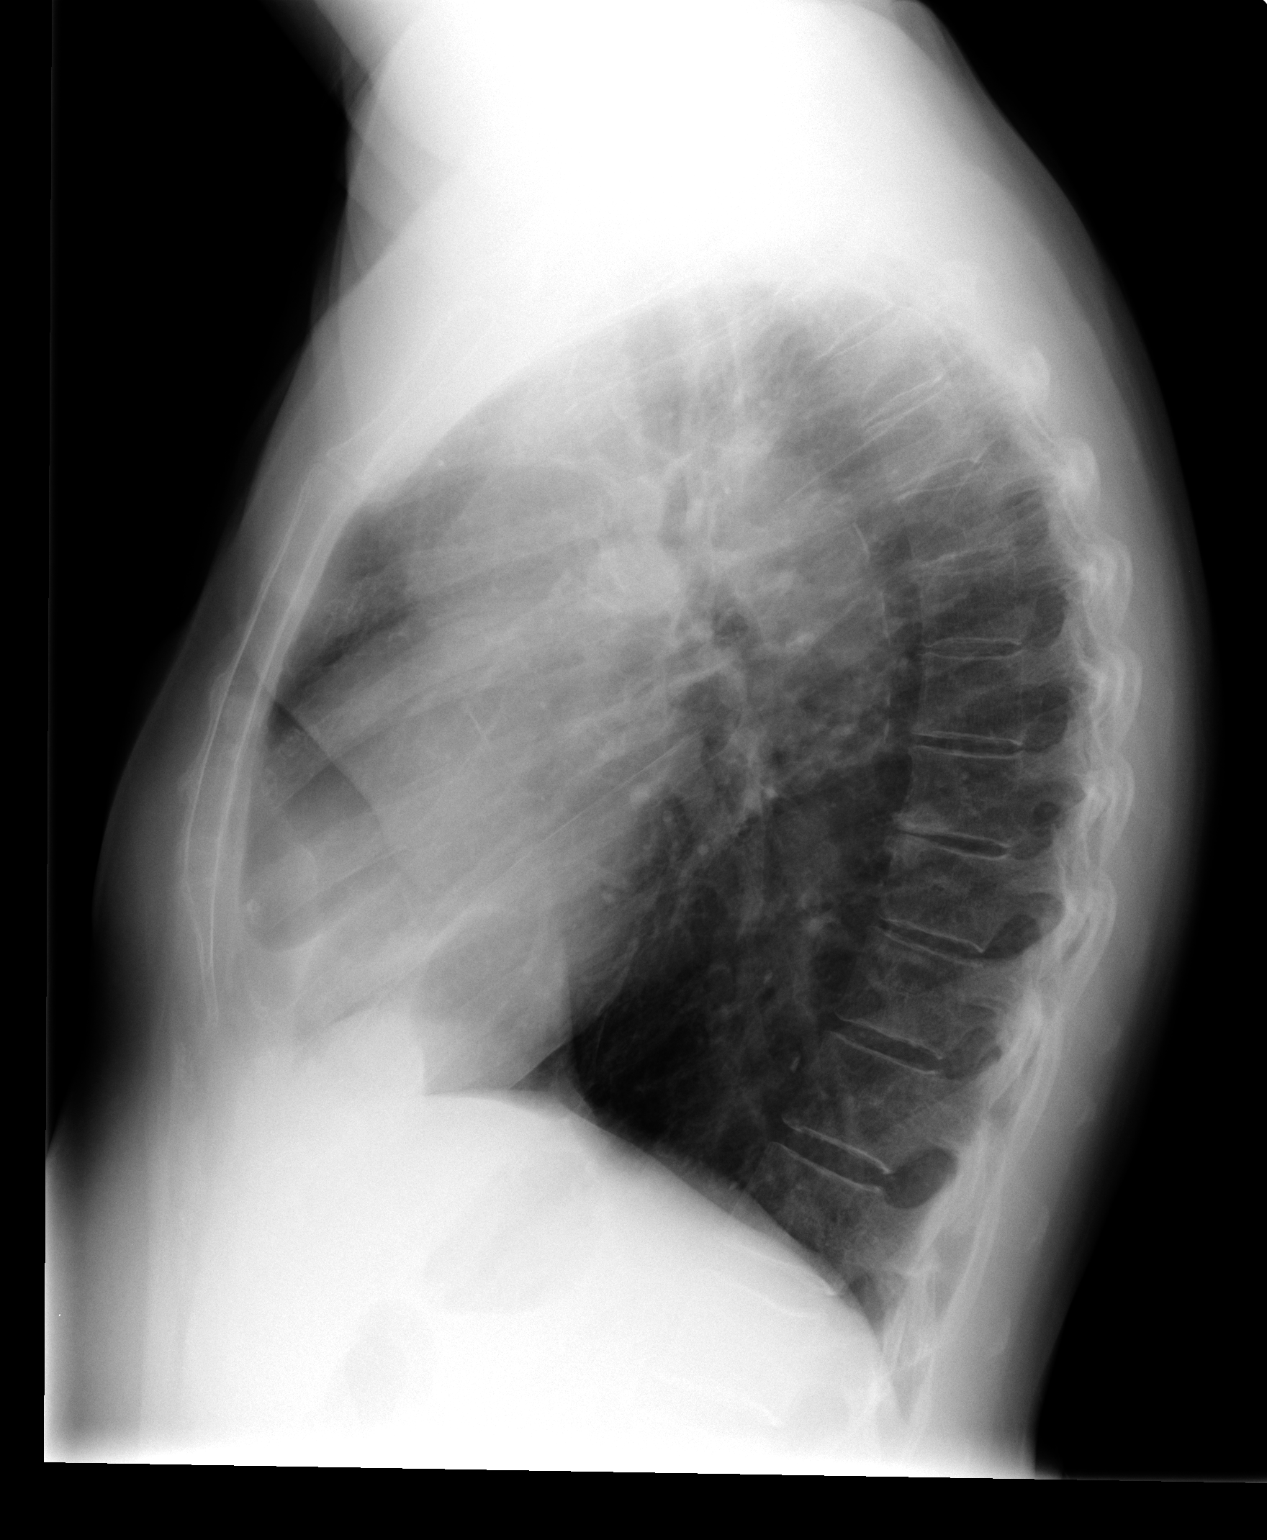

[2 of 2 positions shown; findings below may reference images not displayed]

FINDINGS: The heart size and pulmonary vascularity are normal.
Slight scarring in the right lung apex, stable. Minimal scarring in
the left upper lobe.  Lungs are otherwise clear.  No acute
abnormalities.
IMPRESSION: No acute abnormalities.

## 2015-01-03 ENCOUNTER — Other Ambulatory Visit: Payer: Self-pay | Admitting: *Deleted

## 2015-01-03 MED ORDER — AMLODIPINE BESYLATE 10 MG PO TABS: 10.0000 mg | ORAL_TABLET | Freq: Every day | ORAL | Status: DC

## 2015-01-06 ENCOUNTER — Other Ambulatory Visit: Payer: Self-pay | Admitting: *Deleted

## 2015-01-06 MED ORDER — MONTELUKAST SODIUM 10 MG PO TABS: 10.0000 mg | ORAL_TABLET | Freq: Every day | ORAL | Status: DC

## 2015-01-16 ENCOUNTER — Telehealth: Payer: Self-pay | Admitting: *Deleted

## 2015-01-16 NOTE — Telephone Encounter (Signed)
Patient is requesting a refill of ALPRAZolam (XANAX) 0.5 MG tablet [facility], [address] [place] [address] Phone [phone] fax [phone]

## 2015-01-17 ENCOUNTER — Encounter (INDEPENDENT_AMBULATORY_CARE_PROVIDER_SITE_OTHER): Payer: Medicare Other

## 2015-01-17 ENCOUNTER — Ambulatory Visit (INDEPENDENT_AMBULATORY_CARE_PROVIDER_SITE_OTHER): Payer: Medicare Other | Admitting: Adult Health

## 2015-01-17 ENCOUNTER — Encounter: Payer: Self-pay | Admitting: Adult Health

## 2015-01-17 VITALS — BP 110/64 | HR 84 | Ht 65.0 in | Wt 152.2 lb

## 2015-01-17 DIAGNOSIS — I10 Essential (primary) hypertension: Secondary | ICD-10-CM

## 2015-01-17 DIAGNOSIS — R002 Palpitations: Secondary | ICD-10-CM

## 2015-01-17 NOTE — Progress Notes (Signed)
Cardiology Office Note   Date:  01/17/2015   ID:  Gina, Weber [birth year], MRN [id number]  PCP:  Kennyth Arnold, FNP  Cardiologist:  Est. with Dr. Cloria Spring, NP   Chief Complaint  Patient presents with  . Hypertension      History of Present Illness: Gina Weber is a 69 y.o. female who presents for assessment and management of hypertension.  The patient is yet to be established with Dr. Harl Bowie.  She was last seen in January 2015 with multiple somatic complaints, but was stable from a cardiac standpoint.  Other history includes COPD, and depression.  She was referred to pulmonology for further evaluation for COPD, and need for changes in medications.  Her blood pressure was controlled.  A copy of her labs was sent to [place] which were drawn on 01/15/2015.  Sodium 142, calcium 9.1, glucose 100, CO2 25, creatinine 0.73, BUN 7, LFTs within normal limits.  Hemoglobin 13.7, hematocrit 40.9.  EKG dated 01/13/2005 he revealed normal sinus rhythm.  Right atrial enlargement was noted with mild LVH.  Rate was 85 beats per minute.  And she comes today with multiple somatic complaints and psychosocial issues.  She is complaining of frequent palpitations, heart racing.  Primary care physician, her strong blood, and requests a cardiac monitor replaced on the patient.  She is under a great deal of stress due to family issues.  She has been in detail concerning all of them. She admits to drinking a lot of caffeine, iced tea throughout the day.   Past Medical History  Diagnosis Date  . Fibromyalgia   . Arthritis   . Allergy   . Hyperlipidemia   . Hypertension   . GERD (gastroesophageal reflux disease)   . Depression   . COPD (chronic obstructive pulmonary disease)   . Anxiety   . Diverticulosis   . Breast cancer 1987 & 2006    radiation therapy  . Thyroid disease   . Osteopenia 06/12/2013    Past Surgical History  Procedure Laterality Date  . Abdominal hysterectomy    .  Mastectomy  1987    bilateral f/u reconstruction  . Cataract extraction      left eye  . Appendectomy       Current Outpatient Prescriptions  Medication Sig Dispense Refill  . albuterol (PROAIR HFA) 108 (90 BASE) MCG/ACT inhaler Inhale 2 puffs into the lungs every 6 (six) hours as needed for wheezing. 18 g 5  . ALPRAZolam (XANAX) 0.5 MG tablet Take 1 tablet (0.5 mg total) by mouth at bedtime as needed. For sleep 90 tablet 0  . amitriptyline (ELAVIL) 10 MG tablet Take 2 tablets (20 mg total) by mouth at bedtime. 180 tablet 1  . amLODipine (NORVASC) 10 MG tablet Take 1 tablet (10 mg total) by mouth daily. 90 tablet 1  . doxycycline (VIBRA-TABS) 100 MG tablet Take 1 tablet (100 mg total) by mouth 2 (two) times daily. 20 tablet 0  . DULoxetine (CYMBALTA) 30 MG capsule Take 2 capsules (60 mg total) by mouth daily. 180 capsule 1  . esomeprazole (NEXIUM) 40 MG capsule Take 1 capsule (40 mg total) by mouth daily. 90 capsule 3  . Fluticasone-Salmeterol (ADVAIR DISKUS) 100-50 MCG/DOSE AEPB Inhale 1 puff into the lungs every 12 (twelve) hours. 180 each 3  . levothyroxine (LEVOTHROID) 25 MCG tablet Take 1 tablet (25 mcg total) by mouth daily. 90 tablet 1  . losartan (COZAAR) 100 MG tablet Take 1 tablet (100 mg total)  by mouth daily. 90 tablet 1  . meloxicam (MOBIC) 7.5 MG tablet Take 1 tablet (7.5 mg total) by mouth daily. 90 tablet 1  . montelukast (SINGULAIR) 10 MG tablet Take 1 tablet (10 mg total) by mouth at bedtime. 90 tablet 0  . potassium chloride (K-DUR) 10 MEQ tablet Take 1 tablet (10 mEq total) by mouth 2 (two) times daily. 60 tablet 2   No current facility-administered medications for this visit.    Allergies:   Seroquel; Pravastatin sodium; Statins; and Sulfamethoxazole    Social History:  The patient  reports that she has never smoked. She has never used smokeless tobacco. She reports that she does not drink alcohol or use illicit drugs.   Family History:  The patient's family  history includes Aneurysm in her father; Cancer (age of onset: 79) in her mother; Depression in her son; Heart disease in her father. There is no history of Colon cancer.    ROS: .   All other systems are reviewed and negative.Unless otherwise mentioned in H&P above.   PHYSICAL EXAM: VS:  BP 110/64 mmHg  Pulse 84  Ht 5\' 5"  (1.651 m)  Wt 152 lb 3.2 oz (69.037 kg)  BMI 25.33 kg/m2  SpO2 98% , BMI Body mass index is 25.33 kg/(m^2). GEN: Well nourished, well developed, in no acute distress HEENT: normal Neck: no JVD, carotid bruits, or masses Cardiac: RRR; no murmurs, rubs, or gallops,no edema  Respiratory:  clear to auscultation bilaterally, normal work of breathing GI: soft, nontender, nondistended, + BS MS: no deformity or atrophy Skin: warm and dry, no rash Neuro:  Strength and sensation are intact Psych: euthymic mood, full affect   EKG:  Normal sinus rhythm, rate of 77 beats per minute without acute ST-T wave changes. Recent Labs: 07/08/2014: ALT 28; BUN 17; Creatinine, Ser 0.8; Hemoglobin 14.5; Platelets 237.0; Potassium 3.7; Sodium 138; TSH 0.74    Lipid Panel    Component Value Date/Time   CHOL 287* 07/08/2014 1106   TRIG 181.0* 07/08/2014 1106   HDL 68.00 07/08/2014 1106   CHOLHDL 4 07/08/2014 1106   VLDL 36.2 07/08/2014 1106   LDLCALC 183* 07/08/2014 1106   LDLDIRECT 181.8 03/02/2013 1123      Wt Readings from Last 3 Encounters:  01/17/15 152 lb 3.2 oz (69.037 kg)  07/12/14 156 lb (70.761 kg)  07/08/14 156 lb (70.761 kg)      Other studies Reviewed: Additional studies/ records that were reviewed today include: None Review of the above records demonstrates: N/A   ASSESSMENT AND PLAN:  1.  Frequent palpitations.  The patient states that her heart rate will race and then become normal and do a lot of skipping.  She is under a great deal of stress in her personal life is also drink a lot of caffeine.  I note that this will probably contribute to her current  palpitations.  However, I will place a cardiac monitor to evaluate, frequency, and morphology of her palpitations.  The patient is not on any AV nodal blockers at this time.  May need to consider that.  2. Hypertension: blood pressure is well-controlled currently.  Will continue amlodipine and losartan.  No changes on any medications at this time.  3. Depression and anxiety: I have encouraged the patient to seek psychological counseling.  She states that she on my advice has made an appointment with a counselor.  The first week of August.  I have encouraged her to keep that appointment.  Current medicines are reviewed at length with the patient today.    Labs/ tests ordered today include: Cardiac monitor for 2 weeks No orders of the defined types were placed in this encounter.     Disposition:   FU with  1 month  Signed, Jory Sims, NP  01/17/2015 1:34 PM    [facility] [address], [place], [place] [address] Phone: [phone]; Fax: [phone]

## 2015-01-17 NOTE — Telephone Encounter (Signed)
Ok to call in

## 2015-01-17 NOTE — Progress Notes (Deleted)
Name: Gina Weber    DOB: [birth year]  Age: 69 y.o.  MR#: [id number]       PCP:  Kennyth Arnold, FNP      Insurance: Payor: MEDICARE / Plan: MEDICARE PART A AND B / Product Type: *No Product type* /   CC:    Chief Complaint  Patient presents with  . Hypertension    VS Filed Vitals:   01/17/15 1327  BP: 110/64  Pulse: 84  Height: '5\' 5"'  (1.651 m)  Weight: 152 lb 3.2 oz (69.037 kg)  SpO2: 98%    Weights Current Weight  01/17/15 152 lb 3.2 oz (69.037 kg)  07/12/14 156 lb (70.761 kg)  07/08/14 156 lb (70.761 kg)    Blood Pressure  BP Readings from Last 3 Encounters:  01/17/15 110/64  07/12/14 142/78  07/08/14 132/64     Admit date:  (Not on file) Last encounter with RMR:  Visit date not found   Allergy Seroquel; Pravastatin sodium; Statins; and Sulfamethoxazole  Current Outpatient Prescriptions  Medication Sig Dispense Refill  . albuterol (PROAIR HFA) 108 (90 BASE) MCG/ACT inhaler Inhale 2 puffs into the lungs every 6 (six) hours as needed for wheezing. 18 g 5  . ALPRAZolam (XANAX) 0.5 MG tablet Take 1 tablet (0.5 mg total) by mouth at bedtime as needed. For sleep 90 tablet 0  . amitriptyline (ELAVIL) 10 MG tablet Take 2 tablets (20 mg total) by mouth at bedtime. 180 tablet 1  . amLODipine (NORVASC) 10 MG tablet Take 1 tablet (10 mg total) by mouth daily. 90 tablet 1  . doxycycline (VIBRA-TABS) 100 MG tablet Take 1 tablet (100 mg total) by mouth 2 (two) times daily. 20 tablet 0  . DULoxetine (CYMBALTA) 30 MG capsule Take 2 capsules (60 mg total) by mouth daily. 180 capsule 1  . esomeprazole (NEXIUM) 40 MG capsule Take 1 capsule (40 mg total) by mouth daily. 90 capsule 3  . Fluticasone-Salmeterol (ADVAIR DISKUS) 100-50 MCG/DOSE AEPB Inhale 1 puff into the lungs every 12 (twelve) hours. 180 each 3  . levothyroxine (LEVOTHROID) 25 MCG tablet Take 1 tablet (25 mcg total) by mouth daily. 90 tablet 1  . losartan (COZAAR) 100 MG tablet Take 1 tablet (100 mg total) by mouth daily. 90  tablet 1  . meloxicam (MOBIC) 7.5 MG tablet Take 1 tablet (7.5 mg total) by mouth daily. 90 tablet 1  . montelukast (SINGULAIR) 10 MG tablet Take 1 tablet (10 mg total) by mouth at bedtime. 90 tablet 0  . potassium chloride (K-DUR) 10 MEQ tablet Take 1 tablet (10 mEq total) by mouth 2 (two) times daily. 60 tablet 2   No current facility-administered medications for this visit.    Discontinued Meds:   There are no discontinued medications.  Patient Active Problem List   Diagnosis Date Noted  . Osteopenia 06/12/2013  . Pure hypercholesterolemia 01/17/2013  . Chest pain 09/06/2012  . Anxiety 08/02/2012  . Hypothyroidism 04/01/2012  . RLS (restless legs syndrome) 12/29/2010  . DIVERTICULOSIS OF COLON 02/05/2010  . COPD 10/31/2009  . HYPERLIPIDEMIA 01/30/2008  . DEPRESSION 01/30/2008  . HYPERTENSION 11/27/2007  . ALLERGIC RHINITIS 11/27/2007  . GERD 11/27/2007  . BREAST CANCER, HX OF 11/27/2007    LABS    Component Value Date/Time   NA 138 07/08/2014 1106   NA 140 01/16/2014 1157   NA 141 09/12/2013 1017   K 3.7 07/08/2014 1106   K 3.8 01/16/2014 1157   K 3.4* 09/12/2013 1017   CL  105 07/08/2014 1106   CL 106 01/16/2014 1157   CL 104 09/12/2013 1017   CO2 22 07/08/2014 1106   CO2 26 01/16/2014 1157   CO2 27 09/12/2013 1017   GLUCOSE 89 07/08/2014 1106   GLUCOSE 87 01/16/2014 1157   GLUCOSE 92 09/12/2013 1017   BUN 17 07/08/2014 1106   BUN 7 01/16/2014 1157   BUN 8 09/12/2013 1017   CREATININE 0.8 07/08/2014 1106   CREATININE 0.8 01/16/2014 1157   CREATININE 0.8 09/12/2013 1017   CREATININE 0.73 09/06/2012 1341   CALCIUM 9.4 07/08/2014 1106   CALCIUM 9.5 01/16/2014 1157   CALCIUM 9.4 09/12/2013 1017   GFRNONAA 87* 07/14/2011 1517   GFRNONAA >60 10/15/2010 1349   GFRNONAA 81.01 04/03/2010 1207   GFRAA >90 07/14/2011 1517   GFRAA  10/15/2010 1349    >60        The eGFR has been calculated using the MDRD equation. This calculation has not been validated in all  clinical situations. eGFR's persistently <60 mL/min signify possible Chronic Kidney Disease.   GFRAA 108 08/19/2008 1013   CMP     Component Value Date/Time   NA 138 07/08/2014 1106   K 3.7 07/08/2014 1106   CL 105 07/08/2014 1106   CO2 22 07/08/2014 1106   GLUCOSE 89 07/08/2014 1106   BUN 17 07/08/2014 1106   CREATININE 0.8 07/08/2014 1106   CREATININE 0.73 09/06/2012 1341   CALCIUM 9.4 07/08/2014 1106   PROT 7.6 07/08/2014 1106   ALBUMIN 4.4 07/08/2014 1106   AST 22 07/08/2014 1106   ALT 28 07/08/2014 1106   ALKPHOS 123* 07/08/2014 1106   BILITOT 0.5 07/08/2014 1106   GFRNONAA 87* 07/14/2011 1517   GFRAA >90 07/14/2011 1517       Component Value Date/Time   WBC 12.5* 07/08/2014 1106   WBC 4.9 01/16/2014 1157   WBC 6.1 09/06/2012 1341   HGB 14.5 07/08/2014 1106   HGB 13.5 01/16/2014 1157   HGB 13.7 09/06/2012 1341   HCT 43.4 07/08/2014 1106   HCT 39.9 01/16/2014 1157   HCT 40.0 09/06/2012 1341   MCV 88.8 07/08/2014 1106   MCV 88.8 01/16/2014 1157   MCV 86.8 09/06/2012 1341    Lipid Panel     Component Value Date/Time   CHOL 287* 07/08/2014 1106   TRIG 181.0* 07/08/2014 1106   HDL 68.00 07/08/2014 1106   CHOLHDL 4 07/08/2014 1106   VLDL 36.2 07/08/2014 1106   LDLCALC 183* 07/08/2014 1106   LDLDIRECT 181.8 03/02/2013 1123    ABG No results found for: PHART, PCO2ART, PO2ART, HCO3, TCO2, ACIDBASEDEF, O2SAT   Lab Results  Component Value Date   TSH 0.74 07/08/2014   BNP (last 3 results) No results for input(s): BNP in the last 8760 hours.  ProBNP (last 3 results) No results for input(s): PROBNP in the last 8760 hours.  Cardiac Panel (last 3 results) No results for input(s): CKTOTAL, CKMB, TROPONINI, RELINDX in the last 72 hours.  Iron/TIBC/Ferritin/ %Sat No results found for: IRON, TIBC, FERRITIN, IRONPCTSAT   EKG Orders placed or performed in visit on 07/12/14  . EKG 12-Lead     Prior Assessment and Plan Problem List as of 01/17/2015       Cardiovascular and Mediastinum   HYPERTENSION   Last Assessment & Plan 07/12/2014 Office Visit Written 07/12/2014  2:42 PM by Lendon Colonel, NP    Blood pressure is currently well-controlled.  Will not make any changes in her medication regimen.  Respiratory   ALLERGIC RHINITIS   COPD   Last Assessment & Plan 07/12/2014 Office Visit Written 07/12/2014  2:41 PM by Lendon Colonel, NP    Shortness of breath is likely related to her COPD.  She does not smoke or [place] is around secondhand smoke.  Going to refer her to pulmonology for further evaluation and treatment and need for changes in medication or other testing.  For now.  Her primary care physician has placed her on albuterol and Advair.she is requesting to be seen by a pulmonologist.  I will refer her to Dr. Luan Pulling.        Digestive   GERD   DIVERTICULOSIS OF COLON     Endocrine   Hypothyroidism     Musculoskeletal and Integument   Osteopenia     Other   HYPERLIPIDEMIA   Last Assessment & Plan 10/25/2013 Office Visit Written 10/25/2013  2:00 PM by Lendon Colonel, NP    Followup labs for primary care provider.      DEPRESSION   Last Assessment & Plan 07/12/2014 Office Visit Written 07/12/2014  2:43 PM by Lendon Colonel, NP    I think this is multiplied by the recent death of her husband.she is on medications for this and also for anxiety.  I have advised to seek every counseling to assist her during this time of morning.  Her sauces, likely related to this as well.  She is asking [place] to help with her son about this who is in the waiting room.  I brought him back to the clinic room and talked with both of them concerning her current health status . I also advised her son to support her and seeking out recalcitrance services.  They live in [place], [place], and are advised to seek out counseling services locally.      BREAST CANCER, HX OF   Last Assessment & Plan 07/30/2011 Office Visit Written 07/30/2011  9:40  AM by Lisabeth Pick, MD    Given hx of breast cancer and new onset thoracic pain I'll check an xray      RLS (restless legs syndrome)   Last Assessment & Plan 12/29/2010 Office Visit Written 12/29/2010 11:21 AM by Lisabeth Pick, MD    Reviewed sleep study Start mirapex --side effects discussed      Anxiety   Chest pain   Last Assessment & Plan 10/25/2013 Office Visit Written 10/25/2013  2:00 PM by Lendon Colonel, NP    She continues to have some occasional chest pain. She states that she is doing a lot of heavy lifting in yard work since her husband is ill. Catheterization was normal one year ago. 2 continue risk management. We will see her on a when necessary basis only.      Pure hypercholesterolemia       Imaging: No results found.

## 2015-01-17 NOTE — Patient Instructions (Signed)
Your physician recommends that you schedule a follow-up appointment in:  1 month with Arnold Long, NP.   Your physician recommends that you continue on your current medications as directed. Please refer to the Current Medication list given to you today.  Your physician has recommended that you wear an event monitor. Event monitors are medical devices that record the heart's electrical activity. Doctors most often [place] these monitors to diagnose arrhythmias. Arrhythmias are problems with the speed or rhythm of the heartbeat. The monitor is a small, portable device. You can wear one while you do your normal daily activities. This is usually used to diagnose what is causing palpitations/syncope (passing out).   Thank you for choosing [facility]!

## 2015-01-17 NOTE — Telephone Encounter (Signed)
Attempted to call Rx in but line busy

## 2015-01-20 MED ORDER — ALPRAZOLAM 0.5 MG PO TABS: 0.5000 mg | ORAL_TABLET | Freq: Every evening | ORAL | Status: AC | PRN

## 2015-01-20 NOTE — Telephone Encounter (Signed)
Rx called in 

## 2015-01-22 ENCOUNTER — Encounter: Payer: Self-pay | Admitting: Gastroenterology

## 2015-01-27 ENCOUNTER — Telehealth: Payer: Self-pay | Admitting: Adult Health

## 2015-01-27 NOTE — Telephone Encounter (Signed)
Had radiation to chest,cannot tolerate electrodes,took monitor off after 1 week and mailed back

## 2015-01-27 NOTE — Telephone Encounter (Signed)
Patient states that she is having reaction pads for event monitor / tg

## 2015-01-29 ENCOUNTER — Telehealth (HOSPITAL_COMMUNITY): Payer: Self-pay | Admitting: *Deleted

## 2015-01-29 NOTE — Telephone Encounter (Addendum)
Opened in Error.

## 2015-01-31 ENCOUNTER — Ambulatory Visit (HOSPITAL_COMMUNITY): Payer: Self-pay | Admitting: Psychiatry

## 2015-02-04 ENCOUNTER — Telehealth: Payer: Self-pay | Admitting: Family Medicine

## 2015-02-04 ENCOUNTER — Other Ambulatory Visit: Payer: Self-pay | Admitting: Family Medicine

## 2015-02-04 MED ORDER — ESOMEPRAZOLE MAGNESIUM 40 MG PO CPDR: 40.0000 mg | DELAYED_RELEASE_CAPSULE | Freq: Every day | ORAL | Status: AC

## 2015-02-04 MED ORDER — LOSARTAN POTASSIUM 100 MG PO TABS: 100.0000 mg | ORAL_TABLET | Freq: Every day | ORAL | Status: AC

## 2015-02-04 NOTE — Telephone Encounter (Signed)
Pt is past due for her follow up with Dutch Quint.  Please help her to make an appointment.

## 2015-02-04 NOTE — Telephone Encounter (Signed)
Filled for 90 days.  Last OV note by Justin Mend stated pt should follow up in 4-5 months.  Pt now past due for follow up.  Will send a message to scheduling.

## 2015-02-06 NOTE — Telephone Encounter (Signed)
Pt has a new provider.

## 2015-02-14 ENCOUNTER — Ambulatory Visit (INDEPENDENT_AMBULATORY_CARE_PROVIDER_SITE_OTHER): Payer: Medicare Other | Admitting: Adult Health

## 2015-02-14 ENCOUNTER — Encounter: Payer: Self-pay | Admitting: Adult Health

## 2015-02-14 VITALS — BP 122/68 | HR 93 | Ht 64.0 in | Wt 152.0 lb

## 2015-02-14 DIAGNOSIS — F419 Anxiety disorder, unspecified: Secondary | ICD-10-CM

## 2015-02-14 DIAGNOSIS — I10 Essential (primary) hypertension: Secondary | ICD-10-CM | POA: Diagnosis not present

## 2015-02-14 DIAGNOSIS — R002 Palpitations: Secondary | ICD-10-CM | POA: Diagnosis not present

## 2015-02-14 MED ORDER — AMLODIPINE BESYLATE 5 MG PO TABS: 5.0000 mg | ORAL_TABLET | Freq: Every day | ORAL | Status: AC

## 2015-02-14 MED ORDER — METOPROLOL TARTRATE 25 MG PO TABS: 12.5000 mg | ORAL_TABLET | Freq: Two times a day (BID) | ORAL | Status: DC

## 2015-02-14 NOTE — Progress Notes (Signed)
Name: Gina Weber    DOB: 1945-07-22  Age: 69 y.o.  MR#: [id number]       PCP:  Kennyth Arnold, FNP      Insurance: Payor: MEDICARE / Plan: MEDICARE PART A AND B / Product Type: *No Product type* /   CC:   No chief complaint on file.   VS Filed Vitals:   02/14/15 1337  BP: 122/68  Pulse: 93  Height: _0  (1.626 m)  Weight: 152 lb (68.947 kg)  SpO2: 97%    Weights Current Weight  02/14/15 152 lb (68.947 kg)  01/17/15 152 lb 3.2 oz (69.037 kg)  07/12/14 156 lb (70.761 kg)    Blood Pressure  BP Readings from Last 3 Encounters:  02/14/15 122/68  01/17/15 110/64  07/12/14 142/78     Admit date:  (Not on file) Last encounter with RMR:  01/27/2015   Allergy Seroquel; Pravastatin sodium; Statins; and Sulfamethoxazole  Current Outpatient Prescriptions  Medication Sig Dispense Refill  . albuterol (PROAIR HFA) 108 (90 BASE) MCG/ACT inhaler Inhale 2 puffs into the lungs every 6 (six) hours as needed for wheezing. 18 g 5  . ALPRAZolam (XANAX) 0.5 MG tablet Take 1 tablet (0.5 mg total) by mouth at bedtime as needed. For sleep 90 tablet 0  . amitriptyline (ELAVIL) 10 MG tablet Take 2 tablets (20 mg total) by mouth at bedtime. 180 tablet 1  . amLODipine (NORVASC) 10 MG tablet Take 1 tablet (10 mg total) by mouth daily. 90 tablet 1  . ANORO ELLIPTA 62.5-25 MCG/INH AEPB     . ARNUITY ELLIPTA 100 MCG/ACT AEPB     . DULoxetine (CYMBALTA) 30 MG capsule Take 2 capsules (60 mg total) by mouth daily. 180 capsule 1  . esomeprazole (NEXIUM) 40 MG capsule Take 1 capsule (40 mg total) by mouth daily. 90 capsule 0  . levothyroxine (LEVOTHROID) 25 MCG tablet Take 1 tablet (25 mcg total) by mouth daily. 90 tablet 1  . losartan (COZAAR) 100 MG tablet Take 1 tablet (100 mg total) by mouth daily. 90 tablet 0  . meloxicam (MOBIC) 7.5 MG tablet Take 1 tablet (7.5 mg total) by mouth daily. 90 tablet 1  . potassium chloride (K-DUR) 10 MEQ tablet Take 1 tablet (10 mEq total) by mouth 2 (two) times daily. 60  tablet 2   No current facility-administered medications for this visit.    Discontinued Meds:    Medications Discontinued During This Encounter  Medication Reason  . doxycycline (VIBRA-TABS) 100 MG tablet Error  . Fluticasone-Salmeterol (ADVAIR DISKUS) 100-50 MCG/DOSE AEPB Error  . montelukast (SINGULAIR) 10 MG tablet Error    Patient Active Problem List   Diagnosis Date Noted  . Osteopenia 06/12/2013  . Pure hypercholesterolemia 01/17/2013  . Chest pain 09/06/2012  . Anxiety 08/02/2012  . Hypothyroidism 04/01/2012  . RLS (restless legs syndrome) 12/29/2010  . DIVERTICULOSIS OF COLON 02/05/2010  . COPD 10/31/2009  . HYPERLIPIDEMIA 01/30/2008  . DEPRESSION 01/30/2008  . HYPERTENSION 11/27/2007  . ALLERGIC RHINITIS 11/27/2007  . GERD 11/27/2007  . BREAST CANCER, HX OF 11/27/2007    LABS    Component Value Date/Time   NA 138 07/08/2014 1106   NA 140 01/16/2014 1157   NA 141 09/12/2013 1017   K 3.7 07/08/2014 1106   K 3.8 01/16/2014 1157   K 3.4* 09/12/2013 1017   CL 105 07/08/2014 1106   CL 106 01/16/2014 1157   CL 104 09/12/2013 1017   CO2 22 07/08/2014 1106  CO2 26 01/16/2014 1157   CO2 27 09/12/2013 1017   GLUCOSE 89 07/08/2014 1106   GLUCOSE 87 01/16/2014 1157   GLUCOSE 92 09/12/2013 1017   BUN 17 07/08/2014 1106   BUN 7 01/16/2014 1157   BUN 8 09/12/2013 1017   CREATININE 0.8 07/08/2014 1106   CREATININE 0.8 01/16/2014 1157   CREATININE 0.8 09/12/2013 1017   CREATININE 0.73 09/06/2012 1341   CALCIUM 9.4 07/08/2014 1106   CALCIUM 9.5 01/16/2014 1157   CALCIUM 9.4 09/12/2013 1017   GFRNONAA 87* 07/14/2011 1517   GFRNONAA >60 10/15/2010 1349   GFRNONAA 81.01 04/03/2010 1207   GFRAA >90 07/14/2011 1517   GFRAA  10/15/2010 1349    >60        The eGFR has been calculated using the MDRD equation. This calculation has not been validated in all clinical situations. eGFR's persistently <60 mL/min signify possible Chronic Kidney Disease.   GFRAA 108  08/19/2008 1013   CMP     Component Value Date/Time   NA 138 07/08/2014 1106   K 3.7 07/08/2014 1106   CL 105 07/08/2014 1106   CO2 22 07/08/2014 1106   GLUCOSE 89 07/08/2014 1106   BUN 17 07/08/2014 1106   CREATININE 0.8 07/08/2014 1106   CREATININE 0.73 09/06/2012 1341   CALCIUM 9.4 07/08/2014 1106   PROT 7.6 07/08/2014 1106   ALBUMIN 4.4 07/08/2014 1106   AST 22 07/08/2014 1106   ALT 28 07/08/2014 1106   ALKPHOS 123* 07/08/2014 1106   BILITOT 0.5 07/08/2014 1106   GFRNONAA 87* 07/14/2011 1517   GFRAA >90 07/14/2011 1517       Component Value Date/Time   WBC 12.5* 07/08/2014 1106   WBC 4.9 01/16/2014 1157   WBC 6.1 09/06/2012 1341   HGB 14.5 07/08/2014 1106   HGB 13.5 01/16/2014 1157   HGB 13.7 09/06/2012 1341   HCT 43.4 07/08/2014 1106   HCT 39.9 01/16/2014 1157   HCT 40.0 09/06/2012 1341   MCV 88.8 07/08/2014 1106   MCV 88.8 01/16/2014 1157   MCV 86.8 09/06/2012 1341    Lipid Panel     Component Value Date/Time   CHOL 287* 07/08/2014 1106   TRIG 181.0* 07/08/2014 1106   HDL 68.00 07/08/2014 1106   CHOLHDL 4 07/08/2014 1106   VLDL 36.2 07/08/2014 1106   LDLCALC 183* 07/08/2014 1106   LDLDIRECT 181.8 03/02/2013 1123    ABG No results found for: PHART, PCO2ART, PO2ART, HCO3, TCO2, ACIDBASEDEF, O2SAT   Lab Results  Component Value Date   TSH 0.74 07/08/2014   BNP (last 3 results) No results for input(s): BNP in the last 8760 hours.  ProBNP (last 3 results) No results for input(s): PROBNP in the last 8760 hours.  Cardiac Panel (last 3 results) No results for input(s): CKTOTAL, CKMB, TROPONINI, RELINDX in the last 72 hours.  Iron/TIBC/Ferritin/ %Sat No results found for: IRON, TIBC, FERRITIN, IRONPCTSAT   EKG Orders placed or performed in visit on 01/17/15  . EKG 12-Lead  . EKG 12-Lead     Prior Assessment and Plan Problem List as of 02/14/2015      Cardiovascular and Mediastinum   HYPERTENSION   Last Assessment & Plan 07/12/2014 Office  Visit Written 07/12/2014  2:42 PM by Lendon Colonel, NP    Blood pressure is currently well-controlled.  Will not make any changes in her medication regimen.        Respiratory   ALLERGIC RHINITIS   COPD   Last Assessment &  Plan 07/12/2014 Office Visit Written 07/12/2014  2:41 PM by Lendon Colonel, NP    Shortness of breath is likely related to her COPD.  She does not smoke or [place] is around secondhand smoke.  Going to refer her to pulmonology for further evaluation and treatment and need for changes in medication or other testing.  For now.  Her primary care physician has placed her on albuterol and Advair.she is requesting to be seen by a pulmonologist.  I will refer her to Dr. Luan Pulling.        Digestive   GERD   DIVERTICULOSIS OF COLON     Endocrine   Hypothyroidism     Musculoskeletal and Integument   Osteopenia     Other   HYPERLIPIDEMIA   Last Assessment & Plan 10/25/2013 Office Visit Written 10/25/2013  2:00 PM by Lendon Colonel, NP    Followup labs for primary care provider.      DEPRESSION   Last Assessment & Plan 07/12/2014 Office Visit Written 07/12/2014  2:43 PM by Lendon Colonel, NP    I think this is multiplied by the recent death of her husband.she is on medications for this and also for anxiety.  I have advised to seek every counseling to assist her during this time of morning.  Her sauces, likely related to this as well.  She is asking [place] to help with her son about this who is in the waiting room.  I brought him back to the clinic room and talked with both of them concerning her current health status . I also advised her son to support her and seeking out recalcitrance services.  They live in [place], [place], and are advised to seek out counseling services locally.      BREAST CANCER, HX OF   Last Assessment & Plan 07/30/2011 Office Visit Written 07/30/2011  9:40 AM by Lisabeth Pick, MD    Given hx of breast cancer and new onset thoracic pain I'll  check an xray      RLS (restless legs syndrome)   Last Assessment & Plan 12/29/2010 Office Visit Written 12/29/2010 11:21 AM by Lisabeth Pick, MD    Reviewed sleep study Start mirapex --side effects discussed      Anxiety   Chest pain   Last Assessment & Plan 10/25/2013 Office Visit Written 10/25/2013  2:00 PM by Lendon Colonel, NP    She continues to have some occasional chest pain. She states that she is doing a lot of heavy lifting in yard work since her husband is ill. Catheterization was normal one year ago. 2 continue risk management. We will see her on a when necessary basis only.      Pure hypercholesterolemia       Imaging: No results found.

## 2015-02-14 NOTE — Progress Notes (Signed)
Cardiology Office Note   Date:  02/14/2015   ID:  Gina Weber, DOB Aug 27, 1945, MRN [id number]  PCP:  Gina Arnold, FNP  Cardiologist:   Jory Sims, NP   No chief complaint on file.     History of Present Illness: Gina Weber is a 69 y.o. female who presents for r assessment and management of hypertension. The patient is yet to be established with Dr. Harl Bowie. On last office visit she had complaints of palpitations and various somatic complaints. She is also being followed by [facility]. She admitted to drinking a lot of caffeine. Cardiac monitor was placed.    Sinus rhythm and sinus tachycardia with isolated PAC's and PVC's.  Paroxysmal SVT, sustained run seen. Correlated with symptoms.  One episode of atrial tachyarrhythmia, possibly rapid atrial fibrillation seen. Correlated with symptoms.   She comes today with continued palpitations, usually associated with stress and interaction with her son who is in prison. She has yet to be established with a psychotherapist.   Past Medical History  Diagnosis Date  . Fibromyalgia   . Arthritis   . Allergy   . Hyperlipidemia   . Hypertension   . GERD (gastroesophageal reflux disease)   . Depression   . COPD (chronic obstructive pulmonary disease)   . Anxiety   . Diverticulosis   . Breast cancer 1987 & 2006    radiation therapy  . Thyroid disease   . Osteopenia 06/12/2013    Past Surgical History  Procedure Laterality Date  . Abdominal hysterectomy    . Mastectomy  1987    bilateral f/u reconstruction  . Cataract extraction      left eye  . Appendectomy       Current Outpatient Prescriptions  Medication Sig Dispense Refill  . albuterol (PROAIR HFA) 108 (90 BASE) MCG/ACT inhaler Inhale 2 puffs into the lungs every 6 (six) hours as needed for wheezing. 18 g 5  . ALPRAZolam (XANAX) 0.5 MG tablet Take 1 tablet (0.5 mg total) by mouth at bedtime as needed. For sleep 90 tablet 0  . amitriptyline (ELAVIL) 10 MG  tablet Take 2 tablets (20 mg total) by mouth at bedtime. 180 tablet 1  . amLODipine (NORVASC) 5 MG tablet Take 1 tablet (5 mg total) by mouth daily. 30 tablet 6  . ANORO ELLIPTA 62.5-25 MCG/INH AEPB     . ARNUITY ELLIPTA 100 MCG/ACT AEPB     . DULoxetine (CYMBALTA) 30 MG capsule Take 2 capsules (60 mg total) by mouth daily. 180 capsule 1  . esomeprazole (NEXIUM) 40 MG capsule Take 1 capsule (40 mg total) by mouth daily. 90 capsule 0  . levothyroxine (LEVOTHROID) 25 MCG tablet Take 1 tablet (25 mcg total) by mouth daily. 90 tablet 1  . losartan (COZAAR) 100 MG tablet Take 1 tablet (100 mg total) by mouth daily. 90 tablet 0  . meloxicam (MOBIC) 7.5 MG tablet Take 1 tablet (7.5 mg total) by mouth daily. 90 tablet 1  . potassium chloride (K-DUR) 10 MEQ tablet Take 1 tablet (10 mEq total) by mouth 2 (two) times daily. 60 tablet 2  . metoprolol tartrate (LOPRESSOR) 25 MG tablet Take 0.5 tablets (12.5 mg total) by mouth 2 (two) times daily. 30 tablet 6   No current facility-administered medications for this visit.    Allergies:   Seroquel; Pravastatin sodium; Statins; and Sulfamethoxazole    Social History:  The patient  reports that she has never smoked. She has never used smokeless tobacco. She reports that  she does not drink alcohol or use illicit drugs.   Family History:  The patient's family history includes Aneurysm in her father; Cancer (age of onset: 40) in her mother; Depression in her son; Heart disease in her father. There is no history of Colon cancer.    ROS: .   All other systems are reviewed and negative.Unless otherwise mentioned in H&P above.   PHYSICAL EXAM: VS:  BP 122/68 mmHg  Pulse 93  Ht 5\' 4"  (1.626 m)  Wt 152 lb (68.947 kg)  BMI 26.08 kg/m2  SpO2 97% , BMI Body mass index is 26.08 kg/(m^2). GEN: Well nourished, well developed, in no acute distress HEENT: normal Cardiac: RRR; no murmurs, rubs, or gallops,no edema  Respiratory:  clear to auscultation bilaterally,  normal work of breathing Psych: euthymic mood, full affect   Recent Labs: 07/08/2014: ALT 28; BUN 17; Creatinine, Ser 0.8; Hemoglobin 14.5; Platelets 237.0; Potassium 3.7; Sodium 138; TSH 0.74    Lipid Panel    Component Value Date/Time   CHOL 287* 07/08/2014 1106   TRIG 181.0* 07/08/2014 1106   HDL 68.00 07/08/2014 1106   CHOLHDL 4 07/08/2014 1106   VLDL 36.2 07/08/2014 1106   LDLCALC 183* 07/08/2014 1106   LDLDIRECT 181.8 03/02/2013 1123      Wt Readings from Last 3 Encounters:  02/14/15 152 lb (68.947 kg)  01/17/15 152 lb 3.2 oz (69.037 kg)  07/12/14 156 lb (70.761 kg)      Other studies Reviewed: Additional studies/ records that were reviewed today include: None Review of the above records demonstrates: N/A   ASSESSMENT AND PLAN:  1. SVT:  She continues to have palpitations usually associated with stress and anxiety.  I will begin metoprolol 12.5 mg BID. Will decrease amlodipine to 5 mg daily. Will see her again in 6 months.   2. Hypertension: Continue ARB. Reduce stress if possible and adhere to low sodium diet.   3. Anxiety and Depression: She is again encouraged to see psychotherapy. She requested xanax refills. I referred her back to PCP for this. She verbalizes understanding.    Current medicines are reviewed at length with the patient today.    Labs/ tests ordered today include: None No orders of the defined types were placed in this encounter.     Disposition:   FU with 6 months.  Signed, Jory Sims, NP  02/14/2015 2:21 PM    [facility] [address] [place], [place] [address] Phone: [phone]; Fax: [phone]

## 2015-02-14 NOTE — Patient Instructions (Signed)
Your physician wants you to follow-up in: 6 months with Arnold Long, NP. You will receive a reminder letter in the mail two months in advance. If you don't receive a letter, please call our office to schedule the follow-up appointment.  Your physician has recommended you make the following change in your medication:   Decrease Amlodipine to 5 mg Daily   Start Taking Metoprolol 12.5 mg Two Times Daily   Thank you for choosing [facility]!

## 2015-02-23 ENCOUNTER — Encounter: Payer: Self-pay | Admitting: Internal Medicine

## 2015-03-12 ENCOUNTER — Ambulatory Visit (INDEPENDENT_AMBULATORY_CARE_PROVIDER_SITE_OTHER): Payer: Medicare Other | Admitting: Psychiatry

## 2015-03-12 ENCOUNTER — Encounter (HOSPITAL_COMMUNITY): Payer: Self-pay | Admitting: Psychiatry

## 2015-03-12 DIAGNOSIS — F32A Depression, unspecified: Secondary | ICD-10-CM

## 2015-03-12 DIAGNOSIS — F329 Major depressive disorder, single episode, unspecified: Secondary | ICD-10-CM
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
## 2015-03-12 NOTE — Patient Instructions (Signed)
Discussed orally 

## 2015-03-18 ENCOUNTER — Encounter (HOSPITAL_COMMUNITY): Payer: Self-pay | Admitting: Psychiatry

## 2015-03-18 ENCOUNTER — Ambulatory Visit (HOSPITAL_COMMUNITY): Payer: Self-pay | Admitting: Psychiatry

## 2015-03-18 ENCOUNTER — Ambulatory Visit (INDEPENDENT_AMBULATORY_CARE_PROVIDER_SITE_OTHER): Payer: Medicare Other | Admitting: Psychiatry

## 2015-03-18 DIAGNOSIS — F329 Major depressive disorder, single episode, unspecified: Secondary | ICD-10-CM | POA: Diagnosis not present

## 2015-03-18 DIAGNOSIS — F32A Depression, unspecified: Secondary | ICD-10-CM
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
## 2015-03-18 NOTE — Patient Instructions (Signed)
Discussed orally 

## 2015-04-09 ENCOUNTER — Ambulatory Visit (HOSPITAL_COMMUNITY): Payer: Self-pay | Admitting: Psychiatry

## 2015-04-23 ENCOUNTER — Ambulatory Visit (HOSPITAL_COMMUNITY): Payer: Self-pay | Admitting: Psychiatry

## 2015-08-25 ENCOUNTER — Ambulatory Visit: Payer: Self-pay | Admitting: Adult Health

## 2015-08-25 ENCOUNTER — Encounter: Payer: Self-pay | Admitting: Adult Health

## 2015-08-25 ENCOUNTER — Ambulatory Visit (INDEPENDENT_AMBULATORY_CARE_PROVIDER_SITE_OTHER): Payer: Medicare Other | Admitting: Adult Health

## 2015-08-25 VITALS — BP 124/74 | HR 77 | Ht 65.0 in | Wt 158.0 lb

## 2015-08-25 DIAGNOSIS — R002 Palpitations: Secondary | ICD-10-CM

## 2015-08-25 DIAGNOSIS — I10 Essential (primary) hypertension: Secondary | ICD-10-CM | POA: Diagnosis not present

## 2015-08-25 MED ORDER — METOPROLOL TARTRATE 25 MG PO TABS: 12.5000 mg | ORAL_TABLET | Freq: Two times a day (BID) | ORAL | Status: AC

## 2015-08-25 NOTE — Progress Notes (Signed)
Name: Gina Weber    DOB: 1946-01-28  Age: 70 y.o.  MR#: [id number]       PCP:  Kennyth Arnold, FNP      Insurance: Payor: MEDICARE / Plan: MEDICARE PART A AND B / Product Type: *No Product type* /   CC:   No chief complaint on file.   VS Filed Vitals:   08/25/15 1540  BP: 124/74  Pulse: 77  Height: '5\' 5"'  (1.651 m)  Weight: 158 lb (71.668 kg)  SpO2: 97%    Weights Current Weight  08/25/15 158 lb (71.668 kg)  02/14/15 152 lb (68.947 kg)  01/17/15 152 lb 3.2 oz (69.037 kg)    Blood Pressure  BP Readings from Last 3 Encounters:  08/25/15 124/74  02/14/15 122/68  01/17/15 110/64     Admit date:  (Not on file) Last encounter with RMR:  Visit date not found   Allergy Seroquel; Pravastatin sodium; Statins; and Sulfamethoxazole  Current Outpatient Prescriptions  Medication Sig Dispense Refill  . ALPRAZolam (XANAX) 0.5 MG tablet Take 1 tablet (0.5 mg total) by mouth at bedtime as needed. For sleep 90 tablet 0  . amitriptyline (ELAVIL) 10 MG tablet Take 2 tablets (20 mg total) by mouth at bedtime. 180 tablet 1  . amLODipine (NORVASC) 5 MG tablet Take 1 tablet (5 mg total) by mouth daily. 30 tablet 6  . ANORO ELLIPTA 62.5-25 MCG/INH AEPB     . ARNUITY ELLIPTA 100 MCG/ACT AEPB     . esomeprazole (NEXIUM) 40 MG capsule Take 1 capsule (40 mg total) by mouth daily. 90 capsule 0  . levothyroxine (LEVOTHROID) 25 MCG tablet Take 1 tablet (25 mcg total) by mouth daily. 90 tablet 1  . losartan (COZAAR) 100 MG tablet Take 1 tablet (100 mg total) by mouth daily. 90 tablet 0  . meloxicam (MOBIC) 7.5 MG tablet Take 1 tablet (7.5 mg total) by mouth daily. 90 tablet 1  . metoprolol tartrate (LOPRESSOR) 25 MG tablet Take 0.5 tablets (12.5 mg total) by mouth 2 (two) times daily. 30 tablet 6  . potassium chloride (K-DUR) 10 MEQ tablet Take 1 tablet (10 mEq total) by mouth 2 (two) times daily. 60 tablet 2  . albuterol (PROAIR HFA) 108 (90 BASE) MCG/ACT inhaler Inhale 2 puffs into the lungs every 6  (six) hours as needed for wheezing. 18 g 5  . DULoxetine (CYMBALTA) 30 MG capsule Take 2 capsules (60 mg total) by mouth daily. 180 capsule 1   No current facility-administered medications for this visit.    Discontinued Meds:   There are no discontinued medications.  Patient Active Problem List   Diagnosis Date Noted  . Osteopenia 06/12/2013  . Pure hypercholesterolemia 01/17/2013  . Chest pain 09/06/2012  . Anxiety 08/02/2012  . Hypothyroidism 04/01/2012  . RLS (restless legs syndrome) 12/29/2010  . DIVERTICULOSIS OF COLON 02/05/2010  . COPD 10/31/2009  . HYPERLIPIDEMIA 01/30/2008  . DEPRESSION 01/30/2008  . HYPERTENSION 11/27/2007  . ALLERGIC RHINITIS 11/27/2007  . GERD 11/27/2007  . BREAST CANCER, HX OF 11/27/2007    LABS    Component Value Date/Time   NA 138 07/08/2014 1106   NA 140 01/16/2014 1157   NA 141 09/12/2013 1017   K 3.7 07/08/2014 1106   K 3.8 01/16/2014 1157   K 3.4* 09/12/2013 1017   CL 105 07/08/2014 1106   CL 106 01/16/2014 1157   CL 104 09/12/2013 1017   CO2 22 07/08/2014 1106   CO2 26 01/16/2014 1157  CO2 27 09/12/2013 1017   GLUCOSE 89 07/08/2014 1106   GLUCOSE 87 01/16/2014 1157   GLUCOSE 92 09/12/2013 1017   BUN 17 07/08/2014 1106   BUN 7 01/16/2014 1157   BUN 8 09/12/2013 1017   CREATININE 0.8 07/08/2014 1106   CREATININE 0.8 01/16/2014 1157   CREATININE 0.8 09/12/2013 1017   CREATININE 0.73 09/06/2012 1341   CALCIUM 9.4 07/08/2014 1106   CALCIUM 9.5 01/16/2014 1157   CALCIUM 9.4 09/12/2013 1017   GFRNONAA 87* 07/14/2011 1517   GFRNONAA >60 10/15/2010 1349   GFRNONAA 81.01 04/03/2010 1207   GFRAA >90 07/14/2011 1517   GFRAA  10/15/2010 1349    >60        The eGFR has been calculated using the MDRD equation. This calculation has not been validated in all clinical situations. eGFR's persistently <60 mL/min signify possible Chronic Kidney Disease.   GFRAA 108 08/19/2008 1013   CMP     Component Value Date/Time   NA 138  07/08/2014 1106   K 3.7 07/08/2014 1106   CL 105 07/08/2014 1106   CO2 22 07/08/2014 1106   GLUCOSE 89 07/08/2014 1106   BUN 17 07/08/2014 1106   CREATININE 0.8 07/08/2014 1106   CREATININE 0.73 09/06/2012 1341   CALCIUM 9.4 07/08/2014 1106   PROT 7.6 07/08/2014 1106   ALBUMIN 4.4 07/08/2014 1106   AST 22 07/08/2014 1106   ALT 28 07/08/2014 1106   ALKPHOS 123* 07/08/2014 1106   BILITOT 0.5 07/08/2014 1106   GFRNONAA 87* 07/14/2011 1517   GFRAA >90 07/14/2011 1517       Component Value Date/Time   WBC 12.5* 07/08/2014 1106   WBC 4.9 01/16/2014 1157   WBC 6.1 09/06/2012 1341   HGB 14.5 07/08/2014 1106   HGB 13.5 01/16/2014 1157   HGB 13.7 09/06/2012 1341   HCT 43.4 07/08/2014 1106   HCT 39.9 01/16/2014 1157   HCT 40.0 09/06/2012 1341   MCV 88.8 07/08/2014 1106   MCV 88.8 01/16/2014 1157   MCV 86.8 09/06/2012 1341    Lipid Panel     Component Value Date/Time   CHOL 287* 07/08/2014 1106   TRIG 181.0* 07/08/2014 1106   HDL 68.00 07/08/2014 1106   CHOLHDL 4 07/08/2014 1106   VLDL 36.2 07/08/2014 1106   LDLCALC 183* 07/08/2014 1106   LDLDIRECT 181.8 03/02/2013 1123    ABG No results found for: PHART, PCO2ART, PO2ART, HCO3, TCO2, ACIDBASEDEF, O2SAT   Lab Results  Component Value Date   TSH 0.74 07/08/2014   BNP (last 3 results) No results for input(s): BNP in the last 8760 hours.  ProBNP (last 3 results) No results for input(s): PROBNP in the last 8760 hours.  Cardiac Panel (last 3 results) No results for input(s): CKTOTAL, CKMB, TROPONINI, RELINDX in the last 72 hours.  Iron/TIBC/Ferritin/ %Sat No results found for: IRON, TIBC, FERRITIN, IRONPCTSAT   EKG Orders placed or performed in visit on 01/17/15  . EKG 12-Lead  . EKG 12-Lead     Prior Assessment and Plan Problem List as of 08/25/2015      Cardiovascular and Mediastinum   HYPERTENSION   Last Assessment & Plan 07/12/2014 Office Visit Written 07/12/2014  2:42 PM by Lendon Colonel, NP    Blood  pressure is currently well-controlled.  Will not make any changes in her medication regimen.        Respiratory   ALLERGIC RHINITIS   COPD   Last Assessment & Plan 07/12/2014 Office Visit Written  07/12/2014  2:41 PM by Lendon Colonel, NP    Shortness of breath is likely related to her COPD.  She does not smoke or [place] is around secondhand smoke.  Going to refer her to pulmonology for further evaluation and treatment and need for changes in medication or other testing.  For now.  Her primary care physician has placed her on albuterol and Advair.she is requesting to be seen by a pulmonologist.  I will refer her to Dr. Luan Pulling.        Digestive   GERD   DIVERTICULOSIS OF COLON     Endocrine   Hypothyroidism     Musculoskeletal and Integument   Osteopenia     Other   HYPERLIPIDEMIA   Last Assessment & Plan 10/25/2013 Office Visit Written 10/25/2013  2:00 PM by Lendon Colonel, NP    Followup labs for primary care provider.      DEPRESSION   Last Assessment & Plan 07/12/2014 Office Visit Written 07/12/2014  2:43 PM by Lendon Colonel, NP    I think this is multiplied by the recent death of her husband.she is on medications for this and also for anxiety.  I have advised to seek every counseling to assist her during this time of morning.  Her sauces, likely related to this as well.  She is asking [place] to help with her son about this who is in the waiting room.  I brought him back to the clinic room and talked with both of them concerning her current health status . I also advised her son to support her and seeking out recalcitrance services.  They live in [place], [place], and are advised to seek out counseling services locally.      BREAST CANCER, HX OF   Last Assessment & Plan 07/30/2011 Office Visit Written 07/30/2011  9:40 AM by Lisabeth Pick, MD    Given hx of breast cancer and new onset thoracic pain I'll check an xray      RLS (restless legs syndrome)   Last Assessment &  Plan 12/29/2010 Office Visit Written 12/29/2010 11:21 AM by Lisabeth Pick, MD    Reviewed sleep study Start mirapex --side effects discussed      Anxiety   Chest pain   Last Assessment & Plan 10/25/2013 Office Visit Written 10/25/2013  2:00 PM by Lendon Colonel, NP    She continues to have some occasional chest pain. She states that she is doing a lot of heavy lifting in yard work since her husband is ill. Catheterization was normal one year ago. 2 continue risk management. We will see her on a when necessary basis only.      Pure hypercholesterolemia       Imaging: No results found.

## 2015-08-25 NOTE — Progress Notes (Signed)
Cardiology Office Note   Date:  08/25/2015   ID:  Andrienne, Groller 01/10/1946, MRN [id number]  PCP:  Kennyth Arnold, FNP  Cardiologist:  Est with Branch/ Jory Sims, NP   No chief complaint on file.     History of Present Illness: Gina Weber is a 70 y.o. female who presents for ongoing assessment and management of hypertension, frequent palpitations, with history of depression, being followed by behavioral health.  She was last seen in the office in August of 2016, and has yet to be established with Dr. Harl Bowie.  At that time we reviewed caardiac monitor, which revealed sinus rhythm, and sinus tachycardia with isolated PACs, and PVCs.  She had one episode of SVT.she was started on metoprolol 12.5 mg twice a day.  Amlodipine was decreased from 10-5 mg daily.  She was continued on her ARB.  She was referred for psychotherapy.  She is without complaint today.  She tried going back to caffeinated coffee in the morning and begin him palpitations, so.  She has changed back to decaf.  The metoprolol, appears to be working well for her and she has had no recurrent rapid heart rhythm or palpitations.  She has been seen by behavioral health and has felt much better on her medication regimen, and with counseling.  She plans on getting married in the next several months.  Past Medical History  Diagnosis Date  . Fibromyalgia   . Arthritis   . Allergy   . Hyperlipidemia   . Hypertension   . GERD (gastroesophageal reflux disease)   . Depression   . COPD (chronic obstructive pulmonary disease) ([place])   . Anxiety   . Diverticulosis   . Breast cancer [facility]) 1987 & 2006    radiation therapy  . Thyroid disease   . Osteopenia 06/12/2013    Past Surgical History  Procedure Laterality Date  . Abdominal hysterectomy    . Mastectomy  1987    bilateral f/u reconstruction  . Cataract extraction      left eye  . Appendectomy       Current Outpatient Prescriptions  Medication Sig Dispense Refill   . ALPRAZolam (XANAX) 0.5 MG tablet Take 1 tablet (0.5 mg total) by mouth at bedtime as needed. For sleep 90 tablet 0  . amitriptyline (ELAVIL) 10 MG tablet Take 2 tablets (20 mg total) by mouth at bedtime. 180 tablet 1  . amLODipine (NORVASC) 5 MG tablet Take 1 tablet (5 mg total) by mouth daily. 30 tablet 6  . ANORO ELLIPTA 62.5-25 MCG/INH AEPB     . ARNUITY ELLIPTA 100 MCG/ACT AEPB     . esomeprazole (NEXIUM) 40 MG capsule Take 1 capsule (40 mg total) by mouth daily. 90 capsule 0  . levothyroxine (LEVOTHROID) 25 MCG tablet Take 1 tablet (25 mcg total) by mouth daily. 90 tablet 1  . losartan (COZAAR) 100 MG tablet Take 1 tablet (100 mg total) by mouth daily. 90 tablet 0  . meloxicam (MOBIC) 7.5 MG tablet Take 1 tablet (7.5 mg total) by mouth daily. 90 tablet 1  . metoprolol tartrate (LOPRESSOR) 25 MG tablet Take 0.5 tablets (12.5 mg total) by mouth 2 (two) times daily. 30 tablet 6  . potassium chloride (K-DUR) 10 MEQ tablet Take 1 tablet (10 mEq total) by mouth 2 (two) times daily. 60 tablet 2  . albuterol (PROAIR HFA) 108 (90 BASE) MCG/ACT inhaler Inhale 2 puffs into the lungs every 6 (six) hours as needed for wheezing. 18 g 5  .  DULoxetine (CYMBALTA) 30 MG capsule Take 2 capsules (60 mg total) by mouth daily. 180 capsule 1   No current facility-administered medications for this visit.    Allergies:   Seroquel; Pravastatin sodium; Statins; and Sulfamethoxazole    Social History:  The patient  reports that she has never smoked. She has never used smokeless tobacco. She reports that she does not drink alcohol or use illicit drugs.   Family History:  The patient's family history includes Aneurysm in her father; Cancer (age of onset: 63) in her mother; Depression in her paternal aunt and son; Heart disease in her father. There is no history of Colon cancer.    ROS: All other systems are reviewed and negative. Unless otherwise mentioned in H&P    PHYSICAL EXAM: VS:  BP 124/74 mmHg  Pulse  77  Ht 5\' 5"  (1.651 m)  Wt 158 lb (71.668 kg)  BMI 26.29 kg/m2  SpO2 97% , BMI Body mass index is 26.29 kg/(m^2). GEN: Well nourished, well developed, in no acute distress HEENT: normal Neck: no JVD, carotid bruits, or masses Cardiac: RRR; no murmurs, rubs, or gallops,no edema  Respiratory:  clear to auscultation bilaterally, normal work of breathing GI: soft, nontender, nondistended, + BS MS: no deformity or atrophy Skin: warm and dry, no rash Neuro:  Strength and sensation are intact Psych: euthymic mood, full affect  Recent Labs: No results found for requested labs within last 365 days.    Lipid Panel    Component Value Date/Time   CHOL 287* 07/08/2014 1106   TRIG 181.0* 07/08/2014 1106   HDL 68.00 07/08/2014 1106   CHOLHDL 4 07/08/2014 1106   VLDL 36.2 07/08/2014 1106   LDLCALC 183* 07/08/2014 1106   LDLDIRECT 181.8 03/02/2013 1123      Wt Readings from Last 3 Encounters:  08/25/15 158 lb (71.668 kg)  02/14/15 152 lb (68.947 kg)  01/17/15 152 lb 3.2 oz (69.037 kg)     ASSESSMENT AND PLAN:  1. Palpitations: She is doing much better on metoprolol twice a day.  She realizes that she is very sensitive to caffeine and has now begun to avoided altogether.  I will not make any changes in her medication regimen.  She will see [place] again in one year and at that, time, we will definitely need to be established with Dr. Harl Bowie.  2. Hypertension: blood pressure is currently well-controlled.  I will not make any changes in her current medication regimen.  She will continue amlodipine and losartan.  She will followup with her primary care physician, with labs as she sees [place] provide her more often.  Current medicines are reviewed at length with the patient today.    Labs/ tests ordered today include:  No orders of the defined types were placed in this encounter.     Disposition:   FU with Dr. Harl Bowie TO BE ESTABLISHED ONE YEAR  Signed, Jory Sims, NP  08/25/2015 3:46 PM     [facility] [address] [place], [place] [address] Phone: [phone]; Fax: [phone]

## 2015-08-25 NOTE — Patient Instructions (Signed)
Your physician wants you to follow-up in: 1 Year with Dr. Branch. You will receive a reminder letter in the mail two months in advance. If you don't receive a letter, please call our office to schedule the follow-up appointment.   Your physician recommends that you continue on your current medications as directed. Please refer to the Current Medication list given to you today.  If you need a refill on your cardiac medications before your next appointment, please call your pharmacy.  Thank you for choosing [facility]!   

## 2015-08-30 ENCOUNTER — Emergency Department (HOSPITAL_COMMUNITY)
Admission: EM | Admit: 2015-08-30 | Discharge: 2015-08-30 | Disposition: A | Discharge status: Home / Self Care | Payer: Medicare Other | Attending: Emergency Medicine | Admitting: Emergency Medicine

## 2015-08-30 ENCOUNTER — Emergency Department (HOSPITAL_COMMUNITY): Payer: Medicare Other

## 2015-08-30 ENCOUNTER — Encounter (HOSPITAL_COMMUNITY): Payer: Self-pay | Admitting: Emergency Medicine

## 2015-08-30 DIAGNOSIS — M1990 Unspecified osteoarthritis, unspecified site: Secondary | ICD-10-CM | POA: Diagnosis not present

## 2015-08-30 DIAGNOSIS — F329 Major depressive disorder, single episode, unspecified: Secondary | ICD-10-CM | POA: Diagnosis not present

## 2015-08-30 DIAGNOSIS — Z791 Long term (current) use of non-steroidal anti-inflammatories (NSAID): Secondary | ICD-10-CM | POA: Insufficient documentation

## 2015-08-30 DIAGNOSIS — R0602 Shortness of breath: Secondary | ICD-10-CM | POA: Diagnosis present

## 2015-08-30 DIAGNOSIS — J441 Chronic obstructive pulmonary disease with (acute) exacerbation: Secondary | ICD-10-CM | POA: Insufficient documentation

## 2015-08-30 DIAGNOSIS — K219 Gastro-esophageal reflux disease without esophagitis: Secondary | ICD-10-CM | POA: Insufficient documentation

## 2015-08-30 DIAGNOSIS — E079 Disorder of thyroid, unspecified: Secondary | ICD-10-CM | POA: Insufficient documentation

## 2015-08-30 DIAGNOSIS — Z853 Personal history of malignant neoplasm of breast: Secondary | ICD-10-CM | POA: Insufficient documentation

## 2015-08-30 DIAGNOSIS — M797 Fibromyalgia: Secondary | ICD-10-CM | POA: Diagnosis not present

## 2015-08-30 DIAGNOSIS — F419 Anxiety disorder, unspecified: Secondary | ICD-10-CM | POA: Insufficient documentation

## 2015-08-30 DIAGNOSIS — I10 Essential (primary) hypertension: Secondary | ICD-10-CM | POA: Insufficient documentation

## 2015-08-30 DIAGNOSIS — Z79899 Other long term (current) drug therapy: Secondary | ICD-10-CM | POA: Diagnosis not present

## 2015-08-30 HISTORY — DX: Palpitations: R00.2

## 2015-08-30 LAB — HEPATIC FUNCTION PANEL
ALK PHOS: 134 U/L — AB (ref 38–126)
ALT: 57 U/L — AB (ref 14–54)
AST: 66 U/L — ABNORMAL HIGH (ref 15–41)
Albumin: 3.7 g/dL (ref 3.5–5.0)
BILIRUBIN DIRECT: 0.2 mg/dL (ref 0.1–0.5)
BILIRUBIN INDIRECT: 0.6 mg/dL (ref 0.3–0.9)
BILIRUBIN TOTAL: 0.8 mg/dL (ref 0.3–1.2)
Total Protein: 6.5 g/dL (ref 6.5–8.1)

## 2015-08-30 LAB — CBC
HEMATOCRIT: 40.4 % (ref 36.0–46.0)
Hemoglobin: 13.9 g/dL (ref 12.0–15.0)
MCH: 30.5 pg (ref 26.0–34.0)
MCHC: 34.4 g/dL (ref 30.0–36.0)
MCV: 88.8 fL (ref 78.0–100.0)
Platelets: 184 10*3/uL (ref 150–400)
RBC: 4.55 MIL/uL (ref 3.87–5.11)
RDW: 13.4 % (ref 11.5–15.5)
WBC: 10 10*3/uL (ref 4.0–10.5)

## 2015-08-30 LAB — BASIC METABOLIC PANEL
Anion gap: 8 (ref 5–15)
BUN: 9 mg/dL (ref 6–20)
CO2: 26 mmol/L (ref 22–32)
Calcium: 9.2 mg/dL (ref 8.9–10.3)
Chloride: 107 mmol/L (ref 101–111)
Creatinine, Ser: 0.75 mg/dL (ref 0.44–1.00)
GFR calc Af Amer: >60 mL/min (ref >60)
GLUCOSE: 110 mg/dL — AB (ref 65–99)
POTASSIUM: 3.9 mmol/L (ref 3.5–5.1)
Sodium: 141 mmol/L (ref 135–145)

## 2015-08-30 LAB — LIPASE, BLOOD: LIPASE: 27 U/L (ref 11–51)

## 2015-08-30 LAB — TROPONIN I: Troponin I: <0.03 ng/mL (ref <0.031)

## 2015-08-30 MED ORDER — ACETAMINOPHEN 500 MG PO TABS
1000.0000 mg | ORAL_TABLET | Freq: Once | ORAL | Status: AC
  Administered 2015-08-30: 1000 mg via ORAL
  Filled 2015-08-30: qty 2

## 2015-08-30 MED ORDER — DEXTROSE 50 % IV SOLN
INTRAVENOUS | Status: AC
  Filled 2015-08-30: qty 50

## 2015-08-30 MED ORDER — ALBUTEROL SULFATE (2.5 MG/3ML) 0.083% IN NEBU
2.5000 mg | INHALATION_SOLUTION | Freq: Once | RESPIRATORY_TRACT | Status: AC
  Administered 2015-08-30: 2.5 mg via RESPIRATORY_TRACT
  Filled 2015-08-30: qty 3

## 2015-08-30 MED ORDER — IPRATROPIUM-ALBUTEROL 0.5-2.5 (3) MG/3ML IN SOLN
3.0000 mL | Freq: Once | RESPIRATORY_TRACT | Status: AC
  Administered 2015-08-30: 3 mL via RESPIRATORY_TRACT
  Filled 2015-08-30: qty 3

## 2015-08-30 MED ORDER — DOXYCYCLINE HYCLATE 100 MG PO TABS: 100.0000 mg | ORAL_TABLET | Freq: Two times a day (BID) | ORAL | Status: DC

## 2015-08-30 MED ORDER — PREDNISONE 20 MG PO TABS: 40.0000 mg | ORAL_TABLET | Freq: Every day | ORAL | Status: DC

## 2015-08-30 MED ORDER — BENZONATATE 100 MG PO CAPS: 100.0000 mg | ORAL_CAPSULE | Freq: Three times a day (TID) | ORAL | Status: DC | PRN

## 2015-08-30 MED ORDER — PREDNISONE 50 MG PO TABS
60.0000 mg | ORAL_TABLET | Freq: Once | ORAL | Status: AC
  Administered 2015-08-30: 60 mg via ORAL
  Filled 2015-08-30: qty 1

## 2015-08-30 NOTE — ED Notes (Signed)
Ambulated patient around the nurses station 2x stats stayed at 99%. Made nurse aware.

## 2015-08-30 NOTE — ED Provider Notes (Signed)
CSN: [id number]     Arrival date & time 08/30/15  1606 History   First MD Initiated Contact with Patient 08/30/15 1753     Chief Complaint  Patient presents with  . Shortness of Breath      HPI Pt was seen at 1825.  Per pt, c/o gradual onset and worsening of persistent cough and wheezing for the past 2 days. Pt states she had similar symptoms 2 weeks ago, used her MDI and took a course of prednisone with relief.  Pt states she felt well until yesterday when she began to "cough and wheeze again." Has been associated with constant lower "aching" chest pain since last night. Denies palpitations, no back pain, no abd pain, no N/V/D, no fevers, no rash.     Past Medical History  Diagnosis Date  . Fibromyalgia   . Arthritis   . Allergy   . Hyperlipidemia   . Hypertension   . GERD (gastroesophageal reflux disease)   . Depression   . COPD (chronic obstructive pulmonary disease) ([place])   . Anxiety   . Diverticulosis   . Breast cancer [facility]) 1987 & 2006    radiation therapy  . Thyroid disease   . Osteopenia 06/12/2013  . Palpitations    Past Surgical History  Procedure Laterality Date  . Abdominal hysterectomy    . Mastectomy  1987    bilateral f/u reconstruction  . Cataract extraction      left eye  . Appendectomy     Family History  Problem Relation Age of Onset  . Cancer Mother 80    esophageal   . Aneurysm Father     AAA  . Heart disease Father   . Depression Son     bipolar  . Colon cancer Neg Hx   . Depression Paternal Aunt    Social History  Substance Use Topics  . Smoking status: Never Smoker   . Smokeless tobacco: Never Used  . Alcohol Use: No    Review of Systems ROS: Statement: All systems negative except as marked or noted in the HPI; Constitutional: Negative for fever and chills. ; ; Eyes: Negative for eye pain, redness and discharge. ; ; ENMT: Negative for ear pain, hoarseness, nasal congestion, sinus pressure and sore throat. ; ; Cardiovascular: Negative  for palpitations, diaphoresis, dyspnea and peripheral edema. ; ; Respiratory: +cough, wheezing, CP. Negative for stridor. ; ; Gastrointestinal: Negative for nausea, vomiting, diarrhea, abdominal pain, blood in stool, hematemesis, jaundice and rectal bleeding. . ; ; Genitourinary: Negative for dysuria, flank pain and hematuria. ; ; Musculoskeletal: Negative for back pain and neck pain. Negative for swelling and trauma.; ; Skin: Negative for pruritus, rash, abrasions, blisters, bruising and skin lesion.; ; Neuro: Negative for headache, lightheadedness and neck stiffness. Negative for weakness, altered level of consciousness , altered mental status, extremity weakness, paresthesias, involuntary movement, seizure and syncope.      Allergies  Seroquel; Pravastatin sodium; Statins; and Sulfamethoxazole  Home Medications   Prior to Admission medications   Medication Sig Start Date End Date Taking? Authorizing Provider  albuterol (PROAIR HFA) 108 (90 BASE) MCG/ACT inhaler Inhale 2 puffs into the lungs every 6 (six) hours as needed for wheezing. 01/21/14 05/04/15  Kennyth Arnold, FNP  ALPRAZolam Duanne Moron) 0.5 MG tablet Take 1 tablet (0.5 mg total) by mouth at bedtime as needed. For sleep 01/20/15   Kennyth Arnold, FNP  amitriptyline (ELAVIL) 10 MG tablet Take 2 tablets (20 mg total) by mouth at bedtime.  01/21/14   Kennyth Arnold, FNP  amLODipine (NORVASC) 5 MG tablet Take 1 tablet (5 mg total) by mouth daily. 02/14/15   Lendon Colonel, NP  Jearl Klinefelter ELLIPTA 62.5-25 MCG/INH AEPB  11/28/14   Historical Provider, MD  ARNUITY ELLIPTA 100 MCG/ACT AEPB  12/26/14   Historical Provider, MD  DULoxetine (CYMBALTA) 30 MG capsule Take 2 capsules (60 mg total) by mouth daily. 08/01/14 08/01/15  Kennyth Arnold, FNP  esomeprazole (NEXIUM) 40 MG capsule Take 1 capsule (40 mg total) by mouth daily. 02/04/15   Kennyth Arnold, FNP  levothyroxine (LEVOTHROID) 25 MCG tablet Take 1 tablet (25 mcg total) by mouth daily. 01/21/14   Kennyth Arnold, FNP  losartan (COZAAR) 100 MG tablet Take 1 tablet (100 mg total) by mouth daily. 02/04/15   Kennyth Arnold, FNP  meloxicam (MOBIC) 7.5 MG tablet Take 1 tablet (7.5 mg total) by mouth daily. 07/22/14   Kennyth Arnold, FNP  metoprolol tartrate (LOPRESSOR) 25 MG tablet Take 0.5 tablets (12.5 mg total) by mouth 2 (two) times daily. 08/25/15   Lendon Colonel, NP  potassium chloride (K-DUR) 10 MEQ tablet Take 1 tablet (10 mEq total) by mouth 2 (two) times daily. 10/31/14   Kennyth Arnold, FNP   BP 127/76 mmHg  Pulse 78  Temp(Src) 98.1 F (36.7 C) (Oral)  Resp 18  Ht 5\' 5"  (1.651 m)  Wt 158 lb (71.668 kg)  BMI 26.29 kg/m2  SpO2 100% Physical Exam  1830: Physical examination:  Nursing notes reviewed; Vital signs and O2 SAT reviewed;  Constitutional: Well developed, Well nourished, Well hydrated, In no acute distress; Head:  Normocephalic, atraumatic; Eyes: EOMI, PERRL, No scleral icterus; ENMT: Mouth and pharynx normal, Mucous membranes moist; Neck: Supple, Full range of motion, No lymphadenopathy; Cardiovascular: Regular rate and rhythm, No gallop; Respiratory: Breath sounds diminished & equal bilaterally, faint wheezes. No audible wheezing. Speaking full sentences with ease, Normal respiratory effort/excursion; Chest: Nontender, Movement normal; Abdomen: Soft, Nontender, Nondistended, Normal bowel sounds; Genitourinary: No CVA tenderness; Extremities: Pulses normal, No tenderness, No edema, No calf edema or asymmetry.; Neuro: AA&Ox3, Major CN grossly intact.  Speech clear. No gross focal motor or sensory deficits in extremities.; Skin: Color normal, Warm, Dry.   ED Course  Procedures (including critical care time) Labs Review  Imaging Review  I have personally reviewed and evaluated these images and lab results as part of my medical decision-making.   EKG Interpretation   Date/Time:  Saturday August 30 2015 16:39:06 EST Ventricular Rate:  86 PR Interval:  142 QRS Duration: 76 QT  Interval:  366 QTC Calculation: 437 R Axis:   62 Text Interpretation:  Normal sinus rhythm Normal ECG When compared with  ECG of 10/15/2010 No significant change was found Confirmed by [facility]  MD,  Nunzio Cory [phone]) on 08/30/2015 6:55:03 PM      MDM  MDM Reviewed: previous chart, nursing note and vitals Reviewed previous: labs and ECG Interpretation: labs, ECG and x-ray     Results for orders placed or performed during the hospital encounter of [id number]  Basic metabolic panel  Result Value Ref Range   Sodium 141 135 - 145 mmol/L   Potassium 3.9 3.5 - 5.1 mmol/L   Chloride 107 101 - 111 mmol/L   CO2 26 22 - 32 mmol/L   Glucose, Bld 110 (H) 65 - 99 mg/dL   BUN 9 6 - 20 mg/dL   Creatinine, Ser 0.75 0.44 - 1.00 mg/dL  Calcium 9.2 8.9 - 10.3 mg/dL   GFR calc non Af Amer >60 >60 mL/min   GFR calc Af Amer >60 >60 mL/min   Anion gap 8 5 - 15  CBC  Result Value Ref Range   WBC 10.0 4.0 - 10.5 K/uL   RBC 4.55 3.87 - 5.11 MIL/uL   Hemoglobin 13.9 12.0 - 15.0 g/dL   HCT 40.4 36.0 - 46.0 %   MCV 88.8 78.0 - 100.0 fL   MCH 30.5 26.0 - 34.0 pg   MCHC 34.4 30.0 - 36.0 g/dL   RDW 13.4 11.5 - 15.5 %   Platelets 184 150 - 400 K/uL  Troponin I  Result Value Ref Range   Troponin I <0.03 <0.031 ng/mL  Lipase, blood  Result Value Ref Range   Lipase 27 11 - 51 U/L  Hepatic function panel  Result Value Ref Range   Total Protein 6.5 6.5 - 8.1 g/dL   Albumin 3.7 3.5 - 5.0 g/dL   AST 66 (H) 15 - 41 U/L   ALT 57 (H) 14 - 54 U/L   Alkaline Phosphatase 134 (H) 38 - 126 U/L   Total Bilirubin 0.8 0.3 - 1.2 mg/dL   Bilirubin, Direct 0.2 0.1 - 0.5 mg/dL   Indirect Bilirubin 0.6 0.3 - 0.9 mg/dL   Dg Chest 2 View 08/30/2015  CLINICAL DATA:  Cough, shortness of breath, recent treatment with prednisone and inhaler/nebulizer at home, epigastric and chest pain with movement, tachypnea, hypertension, COPD, breast cancer EXAM: CHEST  2 VIEW COMPARISON:  09/06/2012 FINDINGS: Normal heart size,  mediastinal contours, and pulmonary vascularity. Calcified adenopathy RIGHT hilum. Emphysematous and minimal bronchitic changes consistent with COPD. Biapical scarring greater on RIGHT. Bibasilar atelectasis. No acute infiltrate, pleural effusion or pneumothorax. Bones demineralized. IMPRESSION: COPD changes with biapical scarring and bibasilar atelectasis. No acute infiltrate. Electronically Signed   By: Lavonia Dana M.D.   On: 08/30/2015 17:18    1946:  Pt states she "feels better" after neb and steroid.  NAD, lungs CTA bilat, no wheezing, resps easy, speaking full sentences, Sats 98-100% R/A.  Pt ambulated around the ED with Sats remaining 99 % R/A, resps easy, NAD.  Wants to go home now. Doubt PE as cause for symptoms with low risk [place].  Doubt ACS as cause for symptoms with normal troponin and unchanged EKG from previous after 2 days of constant symptoms. Tx COPD exacerbation symptomatically at this time. Dx and testing d/w pt.  Questions answered.  Verb understanding, agreeable to d/c home with outpt f/u.     Francine Graven, DO 09/03/15 [phone]

## 2015-08-30 NOTE — Discharge Instructions (Signed)
Take the prescriptions as directed.  Use your albuterol inhaler (2 to 4 puffs) every 4 hours for the next 7 days, then as needed for cough, wheezing, or shortness of breath. Your liver function tests were mildly elevated today. Avoid tylenol, tylenol containing products and alcohol until you are seen in follow up.  Call your regular medical doctor Monday morning to schedule a follow up appointment within the next 3 days and to re-check your liver function tests.  Return to the Emergency Department immediately sooner if worsening.

## 2015-08-30 NOTE — ED Notes (Signed)
Patient verbalizes understanding of discharge instructions, prescription medications, home care, and follow up care if needed. Patient out of department at this time.

## 2015-08-30 NOTE — ED Notes (Signed)
Patient with c/o cough and shortness of breath. Recent Rx for prednisone and has inhalers and nebs at home and uses when needed. C/o epigastric pain and chest pain with movement and position changes. Alert/oriented. Tachypnea, but unlabored at present.

## 2016-07-06 ENCOUNTER — Encounter: Payer: Self-pay | Admitting: Adult Health

## 2016-07-06 ENCOUNTER — Ambulatory Visit (INDEPENDENT_AMBULATORY_CARE_PROVIDER_SITE_OTHER): Payer: Medicare Other | Admitting: Adult Health

## 2016-07-06 VITALS — BP 126/72 | HR 79 | Ht 65.0 in | Wt 158.0 lb

## 2016-07-06 DIAGNOSIS — R079 Chest pain, unspecified: Secondary | ICD-10-CM | POA: Diagnosis not present

## 2016-07-06 DIAGNOSIS — F320 Major depressive disorder, single episode, mild: Secondary | ICD-10-CM | POA: Diagnosis not present

## 2016-07-06 DIAGNOSIS — I10 Essential (primary) hypertension: Secondary | ICD-10-CM

## 2016-07-06 NOTE — Progress Notes (Signed)
Name: Gina Weber    DOB: 01/21/1946  Age: 71 y.o.  MR#: [id number]       PCP:  Kennyth Arnold, FNP      Insurance: Payor: MEDICARE / Plan: MEDICARE PART A AND B / Product Type: *No Product type* /   CC:   No chief complaint on file.   VS Vitals:   07/06/16 1429  BP: 126/72  Pulse: 79  SpO2: 98%  Weight: 158 lb (71.7 kg)  Height: '5\' 5"'  (1.651 m)    Weights Current Weight  07/06/16 158 lb (71.7 kg)  08/30/15 158 lb (71.7 kg)  08/25/15 158 lb (71.7 kg)    Blood Pressure  BP Readings from Last 3 Encounters:  07/06/16 126/72  08/30/15 127/76  08/25/15 124/74     Admit date:  (Not on file) Last encounter with RMR:  Visit date not found   Allergy Seroquel [quetiapine fumarate]; Pravastatin sodium; Statins; and Sulfamethoxazole  Current Outpatient Prescriptions  Medication Sig Dispense Refill  . albuterol (PROAIR HFA) 108 (90 BASE) MCG/ACT inhaler Inhale 2 puffs into the lungs every 6 (six) hours as needed for wheezing. 18 g 5  . ALPRAZolam (XANAX) 0.5 MG tablet Take 1 tablet (0.5 mg total) by mouth at bedtime as needed. For sleep 90 tablet 0  . amitriptyline (ELAVIL) 10 MG tablet Take 2 tablets (20 mg total) by mouth at bedtime. 180 tablet 1  . amLODipine (NORVASC) 5 MG tablet Take 1 tablet (5 mg total) by mouth daily. 30 tablet 6  . ANORO ELLIPTA 62.5-25 MCG/INH AEPB     . ARNUITY ELLIPTA 100 MCG/ACT AEPB     . DULoxetine (CYMBALTA) 30 MG capsule Take 2 capsules (60 mg total) by mouth daily. 180 capsule 1  . esomeprazole (NEXIUM) 40 MG capsule Take 1 capsule (40 mg total) by mouth daily. 90 capsule 0  . levothyroxine (LEVOTHROID) 25 MCG tablet Take 1 tablet (25 mcg total) by mouth daily. 90 tablet 1  . losartan (COZAAR) 100 MG tablet Take 1 tablet (100 mg total) by mouth daily. 90 tablet 0  . meloxicam (MOBIC) 7.5 MG tablet Take 1 tablet (7.5 mg total) by mouth daily. 90 tablet 1  . metoprolol tartrate (LOPRESSOR) 25 MG tablet Take 0.5 tablets (12.5 mg total) by mouth 2 (two)  times daily. 30 tablet 6  . potassium chloride (K-DUR) 10 MEQ tablet Take 1 tablet (10 mEq total) by mouth 2 (two) times daily. 60 tablet 2   No current facility-administered medications for this visit.     Discontinued Meds:    Medications Discontinued During This Encounter  Medication Reason  . benzonatate (TESSALON) 100 MG capsule Error  . doxycycline (VIBRA-TABS) 100 MG tablet Error  . predniSONE (DELTASONE) 20 MG tablet Error    Patient Active Problem List   Diagnosis Date Noted  . Osteopenia 06/12/2013  . Pure hypercholesterolemia 01/17/2013  . Chest pain 09/06/2012  . Anxiety 08/02/2012  . Hypothyroidism 04/01/2012  . RLS (restless legs syndrome) 12/29/2010  . DIVERTICULOSIS OF COLON 02/05/2010  . COPD 10/31/2009  . HYPERLIPIDEMIA 01/30/2008  . DEPRESSION 01/30/2008  . HYPERTENSION 11/27/2007  . ALLERGIC RHINITIS 11/27/2007  . GERD 11/27/2007  . BREAST CANCER, HX OF 11/27/2007    LABS    Component Value Date/Time   NA 141 08/30/2015 1655   NA 138 07/08/2014 1106   NA 140 01/16/2014 1157   K 3.9 08/30/2015 1655   K 3.7 07/08/2014 1106   K 3.8 01/16/2014 1157  CL 107 08/30/2015 1655   CL 105 07/08/2014 1106   CL 106 01/16/2014 1157   CO2 26 08/30/2015 1655   CO2 22 07/08/2014 1106   CO2 26 01/16/2014 1157   GLUCOSE 110 (H) 08/30/2015 1655   GLUCOSE 89 07/08/2014 1106   GLUCOSE 87 01/16/2014 1157   BUN 9 08/30/2015 1655   BUN 17 07/08/2014 1106   BUN 7 01/16/2014 1157   CREATININE 0.75 08/30/2015 1655   CREATININE 0.8 07/08/2014 1106   CREATININE 0.8 01/16/2014 1157   CREATININE 0.73 09/06/2012 1341   CALCIUM 9.2 08/30/2015 1655   CALCIUM 9.4 07/08/2014 1106   CALCIUM 9.5 01/16/2014 1157   GFRNONAA >60 08/30/2015 1655   GFRNONAA 87 (L) 07/14/2011 1517   GFRNONAA >60 10/15/2010 1349   GFRAA >60 08/30/2015 1655   GFRAA >90 07/14/2011 1517   GFRAA  10/15/2010 1349    >60        The eGFR has been calculated using the MDRD equation. This  calculation has not been validated in all clinical situations. eGFR's persistently <60 mL/min signify possible Chronic Kidney Disease.   CMP     Component Value Date/Time   NA 141 08/30/2015 1655   K 3.9 08/30/2015 1655   CL 107 08/30/2015 1655   CO2 26 08/30/2015 1655   GLUCOSE 110 (H) 08/30/2015 1655   BUN 9 08/30/2015 1655   CREATININE 0.75 08/30/2015 1655   CREATININE 0.73 09/06/2012 1341   CALCIUM 9.2 08/30/2015 1655   PROT 6.5 08/30/2015 1655   ALBUMIN 3.7 08/30/2015 1655   AST 66 (H) 08/30/2015 1655   ALT 57 (H) 08/30/2015 1655   ALKPHOS 134 (H) 08/30/2015 1655   BILITOT 0.8 08/30/2015 1655   GFRNONAA >60 08/30/2015 1655   GFRAA >60 08/30/2015 1655       Component Value Date/Time   WBC 10.0 08/30/2015 1655   WBC 12.5 (H) 07/08/2014 1106   WBC 4.9 01/16/2014 1157   HGB 13.9 08/30/2015 1655   HGB 14.5 07/08/2014 1106   HGB 13.5 01/16/2014 1157   HCT 40.4 08/30/2015 1655   HCT 43.4 07/08/2014 1106   HCT 39.9 01/16/2014 1157   MCV 88.8 08/30/2015 1655   MCV 88.8 07/08/2014 1106   MCV 88.8 01/16/2014 1157    Lipid Panel     Component Value Date/Time   CHOL 287 (H) 07/08/2014 1106   TRIG 181.0 (H) 07/08/2014 1106   HDL 68.00 07/08/2014 1106   CHOLHDL 4 07/08/2014 1106   VLDL 36.2 07/08/2014 1106   LDLCALC 183 (H) 07/08/2014 1106   LDLDIRECT 181.8 03/02/2013 1123    ABG No results found for: PHART, PCO2ART, PO2ART, HCO3, TCO2, ACIDBASEDEF, O2SAT   Lab Results  Component Value Date   TSH 0.74 07/08/2014   BNP (last 3 results) No results for input(s): BNP in the last 8760 hours.  ProBNP (last 3 results) No results for input(s): PROBNP in the last 8760 hours.  Cardiac Panel (last 3 results) No results for input(s): CKTOTAL, CKMB, TROPONINI, RELINDX in the last 72 hours.  Iron/TIBC/Ferritin/ %Sat No results found for: IRON, TIBC, FERRITIN, IRONPCTSAT   EKG Orders placed or performed in visit on 07/06/16  . EKG 12-Lead     Prior Assessment and  Plan Problem List as of 07/06/2016 Reviewed: 08/25/2015  4:02 PM by Jory Sims, NP     Cardiovascular and Mediastinum   HYPERTENSION   Last Assessment & Plan 07/12/2014 Office Visit Written 07/12/2014  2:42 PM by Lendon Colonel,  NP    Blood pressure is currently well-controlled.  Will not make any changes in her medication regimen.        Respiratory   ALLERGIC RHINITIS   COPD   Last Assessment & Plan 07/12/2014 Office Visit Written 07/12/2014  2:41 PM by Lendon Colonel, NP    Shortness of breath is likely related to her COPD.  She does not smoke or [place] is around secondhand smoke.  Going to refer her to pulmonology for further evaluation and treatment and need for changes in medication or other testing.  For now.  Her primary care physician has placed her on albuterol and Advair.she is requesting to be seen by a pulmonologist.  I will refer her to Dr. Luan Pulling.        Digestive   GERD   DIVERTICULOSIS OF COLON     Endocrine   Hypothyroidism     Musculoskeletal and Integument   Osteopenia     Other   HYPERLIPIDEMIA   Last Assessment & Plan 10/25/2013 Office Visit Written 10/25/2013  2:00 PM by Lendon Colonel, NP    Followup labs for primary care provider.      DEPRESSION   Last Assessment & Plan 07/12/2014 Office Visit Written 07/12/2014  2:43 PM by Lendon Colonel, NP    I think this is multiplied by the recent death of her husband.she is on medications for this and also for anxiety.  I have advised to seek every counseling to assist her during this time of morning.  Her sauces, likely related to this as well.  She is asking [place] to help with her son about this who is in the waiting room.  I brought him back to the clinic room and talked with both of them concerning her current health status . I also advised her son to support her and seeking out recalcitrance services.  They live in [place], [place], and are advised to seek out counseling services locally.       BREAST CANCER, HX OF   Last Assessment & Plan 07/30/2011 Office Visit Written 07/30/2011  9:40 AM by Lisabeth Pick, MD    Given hx of breast cancer and new onset thoracic pain I'll check an xray      RLS (restless legs syndrome)   Last Assessment & Plan 12/29/2010 Office Visit Written 12/29/2010 11:21 AM by Lisabeth Pick, MD    Reviewed sleep study Start mirapex --side effects discussed      Anxiety   Chest pain   Last Assessment & Plan 10/25/2013 Office Visit Written 10/25/2013  2:00 PM by Lendon Colonel, NP    She continues to have some occasional chest pain. She states that she is doing a lot of heavy lifting in yard work since her husband is ill. Catheterization was normal one year ago. 2 continue risk management. We will see her on a when necessary basis only.      Pure hypercholesterolemia       Imaging: No results found.

## 2016-07-06 NOTE — Patient Instructions (Signed)
Your physician wants you to follow-up in: 6 Months with Dr. Branch. You will receive a reminder letter in the mail two months in advance. If you don't receive a letter, please call our office to schedule the follow-up appointment.  Your physician recommends that you continue on your current medications as directed. Please refer to the Current Medication list given to you today.  If you need a refill on your cardiac medications before your next appointment, please call your pharmacy.  Thank you for choosing [facility]!   

## 2016-07-06 NOTE — Progress Notes (Signed)
Cardiology Office Note   Date:  07/06/2016   ID:  Gina Weber, DOB 1946-04-11, MRN [id number]  PCP:  Gina Arnold, FNP  Cardiologist: To be established with Branch/  Gina Sims, NP   No chief complaint on file.    History of Present Illness: Gina Weber is a 71 y.o. female who presents for ongoing assessment and management of hypertension, frequent palpitations, with history of depression, being followed by behavioral health. We .reviewed caardiac monitor, which revealed sinus rhythm, and sinus tachycardia with isolated PACs, and PVCs.  She had one episode of SVT.she was started on metoprolol 12.5 mg twice a day.  Amlodipine was decreased from 10-5 mg daily.  She was continued on her ARB.  She was referred for psychotherapy on last office visit.   On last office visit the patient was doing well without any complaints. She was looking toward getting married. She is being followed by behavioral health. The patient has been seen in the emergency room in March 2017 for COPD exacerbation treated and released.  She comes today with complaints of chest discomfort. Usually associated with stress and anxiety. She talks at length about her family problems. She has stopped seeing her psychologist and has run out of her anti-depressant medications.  Past Medical History:  Diagnosis Date  . Allergy   . Anxiety   . Arthritis   . Breast cancer [facility]) 1987 & 2006   radiation therapy  . COPD (chronic obstructive pulmonary disease) ([place])   . Depression   . Diverticulosis   . Fibromyalgia   . GERD (gastroesophageal reflux disease)   . Hyperlipidemia   . Hypertension   . Osteopenia 06/12/2013  . Palpitations   . Thyroid disease     Past Surgical History:  Procedure Laterality Date  . ABDOMINAL HYSTERECTOMY    . APPENDECTOMY    . CATARACT EXTRACTION     left eye  . MASTECTOMY  1987   bilateral f/u reconstruction     Current Outpatient Prescriptions  Medication Sig Dispense Refill  .  albuterol (PROAIR HFA) 108 (90 BASE) MCG/ACT inhaler Inhale 2 puffs into the lungs every 6 (six) hours as needed for wheezing. 18 g 5  . ALPRAZolam (XANAX) 0.5 MG tablet Take 1 tablet (0.5 mg total) by mouth at bedtime as needed. For sleep 90 tablet 0  . amitriptyline (ELAVIL) 10 MG tablet Take 2 tablets (20 mg total) by mouth at bedtime. 180 tablet 1  . amLODipine (NORVASC) 5 MG tablet Take 1 tablet (5 mg total) by mouth daily. 30 tablet 6  . ANORO ELLIPTA 62.5-25 MCG/INH AEPB     . ARNUITY ELLIPTA 100 MCG/ACT AEPB     . DULoxetine (CYMBALTA) 30 MG capsule Take 2 capsules (60 mg total) by mouth daily. 180 capsule 1  . esomeprazole (NEXIUM) 40 MG capsule Take 1 capsule (40 mg total) by mouth daily. 90 capsule 0  . levothyroxine (LEVOTHROID) 25 MCG tablet Take 1 tablet (25 mcg total) by mouth daily. 90 tablet 1  . losartan (COZAAR) 100 MG tablet Take 1 tablet (100 mg total) by mouth daily. 90 tablet 0  . meloxicam (MOBIC) 7.5 MG tablet Take 1 tablet (7.5 mg total) by mouth daily. 90 tablet 1  . metoprolol tartrate (LOPRESSOR) 25 MG tablet Take 0.5 tablets (12.5 mg total) by mouth 2 (two) times daily. 30 tablet 6  . potassium chloride (K-DUR) 10 MEQ tablet Take 1 tablet (10 mEq total) by mouth 2 (two) times daily. 60 tablet  2   No current facility-administered medications for this visit.     Allergies:   Seroquel [quetiapine fumarate]; Pravastatin sodium; Statins; and Sulfamethoxazole    Social History:  The patient  reports that she has never smoked. She has never used smokeless tobacco. She reports that she does not drink alcohol or use drugs.   Family History:  The patient's family history includes Aneurysm in her father; Cancer (age of onset: 93) in her mother; Depression in her paternal aunt and son; Heart disease in her father.    ROS: All other systems are reviewed and negative. Unless otherwise mentioned in H&P    PHYSICAL EXAM: VS:  BP 126/72   Pulse 79   Ht 5\' 5"  (1.651 m)    Wt 158 lb (71.7 kg)   SpO2 98%   BMI 26.29 kg/m  , BMI Body mass index is 26.29 kg/m. GEN: Well nourished, well developed, in no acute distress  HEENT: normal  Neck: no JVD, carotid bruits, or masses Cardiac: RRR; no murmurs, rubs, or gallops,no edema  Respiratory:  Clear to auscultation bilaterally, Mild inspiratory wheezing, normal work of breathing GI: soft, nontender, nondistended, + BS MS: no deformity or atrophy  Skin: warm and dry, no rash Neuro:  Strength and sensation are intact Psych: euthymic mood, full affect   EKG: Normal sinus rhythm, with short PR rate is 72 bpm  Recent Labs: 08/30/2015: ALT 57; BUN 9; Creatinine, Ser 0.75; Hemoglobin 13.9; Platelets 184; Potassium 3.9; Sodium 141    Lipid Panel    Component Value Date/Time   CHOL 287 (H) 07/08/2014 1106   TRIG 181.0 (H) 07/08/2014 1106   HDL 68.00 07/08/2014 1106   CHOLHDL 4 07/08/2014 1106   VLDL 36.2 07/08/2014 1106   LDLCALC 183 (H) 07/08/2014 1106   LDLDIRECT 181.8 03/02/2013 1123      Wt Readings from Last 3 Encounters:  07/06/16 158 lb (71.7 kg)  08/30/15 158 lb (71.7 kg)  08/25/15 158 lb (71.7 kg)      Other studies Reviewed:   ASSESSMENT AND PLAN:  1.  Chest Pain:  Atypical, associated with stress and anxiety. She states that she is caring for her boyfriend who has a lot of medical problems, and also has a lot of family issues. The patient's talks at length about all of her family problems. No cardiac testing is planned today. I've advised her to continue risk modification, low sodium diet and keep her blood pressure under control.  2. Hypertension: Currently controlled on metoprolol and losartan. Continue current medication regimen.  3. Stress and anxiety: Associated depression. The patient had been on Celexa but ran out when she did not see her psychologist, due to boyfriend's illnesses. I will give her one or scription for Celexa 20 mg daily, but she will not receive any refills and will  need to follow-up with her psychologist for ongoing management of this along with Xanax. Cardiology will not manage these medicines. She verbalizes understanding.   Current medicines are reviewed at length with the patient today.    Labs/ tests ordered today include:   Orders Placed This Encounter  Procedures  . EKG 12-Lead     Disposition:   FU with 6 months  Signed, Gina Sims, NP  07/06/2016 3:38 PM    [facility] [address] [place], [place] [address] Phone: [phone]; Fax: [phone]

## 2016-08-17 ENCOUNTER — Encounter: Payer: Self-pay | Admitting: Cardiology

## 2016-08-17 ENCOUNTER — Ambulatory Visit: Payer: Self-pay | Admitting: Cardiology

## 2016-08-17 NOTE — Progress Notes (Deleted)
Clinical Summary Gina Weber is a 71 y.o.female last seen by NP Purcell Nails, this is our first visit together. She is seen for the following medical problems  1. HTN   2. Palpitations - previous monitor showed sinus tach, PACs, PVCs. Isolated episode of SVT - started on lopressor.   3. Depression   4. Chest pain - reported symptoms at last visit. Primarily associated with stress and anxiety.   Past Medical History:  Diagnosis Date  . Allergy   . Anxiety   . Arthritis   . Breast cancer [facility]) 1987 & 2006   radiation therapy  . COPD (chronic obstructive pulmonary disease) ([place])   . Depression   . Diverticulosis   . Fibromyalgia   . GERD (gastroesophageal reflux disease)   . Hyperlipidemia   . Hypertension   . Osteopenia 06/12/2013  . Palpitations   . Thyroid disease      Allergies  Allergen Reactions  . Seroquel [Quetiapine Fumarate] Anaphylaxis  . Pravastatin Sodium     REACTION: liver enzyme abnormality---per pt's report  . Statins     REACTION: leg pain, elevates liver enzymes  . Sulfamethoxazole     REACTION: hives     Current Outpatient Prescriptions  Medication Sig Dispense Refill  . albuterol (PROAIR HFA) 108 (90 BASE) MCG/ACT inhaler Inhale 2 puffs into the lungs every 6 (six) hours as needed for wheezing. 18 g 5  . ALPRAZolam (XANAX) 0.5 MG tablet Take 1 tablet (0.5 mg total) by mouth at bedtime as needed. For sleep 90 tablet 0  . amitriptyline (ELAVIL) 10 MG tablet Take 2 tablets (20 mg total) by mouth at bedtime. 180 tablet 1  . amLODipine (NORVASC) 5 MG tablet Take 1 tablet (5 mg total) by mouth daily. 30 tablet 6  . ANORO ELLIPTA 62.5-25 MCG/INH AEPB     . ARNUITY ELLIPTA 100 MCG/ACT AEPB     . DULoxetine (CYMBALTA) 30 MG capsule Take 2 capsules (60 mg total) by mouth daily. 180 capsule 1  . esomeprazole (NEXIUM) 40 MG capsule Take 1 capsule (40 mg total) by mouth daily. 90 capsule 0  . levothyroxine (LEVOTHROID) 25 MCG tablet Take 1 tablet (25  mcg total) by mouth daily. 90 tablet 1  . losartan (COZAAR) 100 MG tablet Take 1 tablet (100 mg total) by mouth daily. 90 tablet 0  . meloxicam (MOBIC) 7.5 MG tablet Take 1 tablet (7.5 mg total) by mouth daily. 90 tablet 1  . metoprolol tartrate (LOPRESSOR) 25 MG tablet Take 0.5 tablets (12.5 mg total) by mouth 2 (two) times daily. 30 tablet 6  . potassium chloride (K-DUR) 10 MEQ tablet Take 1 tablet (10 mEq total) by mouth 2 (two) times daily. 60 tablet 2   No current facility-administered medications for this visit.      Past Surgical History:  Procedure Laterality Date  . ABDOMINAL HYSTERECTOMY    . APPENDECTOMY    . CATARACT EXTRACTION     left eye  . MASTECTOMY  1987   bilateral f/u reconstruction     Allergies  Allergen Reactions  . Seroquel [Quetiapine Fumarate] Anaphylaxis  . Pravastatin Sodium     REACTION: liver enzyme abnormality---per pt's report  . Statins     REACTION: leg pain, elevates liver enzymes  . Sulfamethoxazole     REACTION: hives      Family History  Problem Relation Age of Onset  . Cancer Mother 48    esophageal   . Aneurysm Father  AAA  . Heart disease Father   . Depression Son     bipolar  . Depression Paternal Aunt   . Colon cancer Neg Hx      Social History Gina Weber reports that she has never smoked. She has never used smokeless tobacco. Gina Weber reports that she does not drink alcohol.   Review of Systems CONSTITUTIONAL: No weight loss, fever, chills, weakness or fatigue.  HEENT: Eyes: No visual loss, blurred vision, double vision or yellow sclerae.No hearing loss, sneezing, congestion, runny nose or sore throat.  SKIN: No rash or itching.  CARDIOVASCULAR:  RESPIRATORY: No shortness of breath, cough or sputum.  GASTROINTESTINAL: No anorexia, nausea, vomiting or diarrhea. No abdominal pain or blood.  GENITOURINARY: No burning on urination, no polyuria NEUROLOGICAL: No headache, dizziness, syncope, paralysis, ataxia,  numbness or tingling in the extremities. No change in bowel or bladder control.  MUSCULOSKELETAL: No muscle, back pain, joint pain or stiffness.  LYMPHATICS: No enlarged nodes. No history of splenectomy.  PSYCHIATRIC: No history of depression or anxiety.  ENDOCRINOLOGIC: No reports of sweating, cold or heat intolerance. No polyuria or polydipsia.  Marland Kitchen   Physical Examination There were no vitals filed for this visit. There were no vitals filed for this visit.  Gen: resting comfortably, no acute distress HEENT: no scleral icterus, pupils equal round and reactive, no palptable cervical adenopathy,  CV Resp: Clear to auscultation bilaterally GI: abdomen is soft, non-tender, non-distended, normal bowel sounds, no hepatosplenomegaly MSK: extremities are warm, no edema.  Skin: warm, no rash Neuro:  no focal deficits Psych: appropriate affect   Diagnostic Studies     Assessment and Plan        Arnoldo Lenis, M.D., F.A.C.C.

## 2017-03-10 ENCOUNTER — Ambulatory Visit: Payer: Self-pay | Admitting: Adult Health

## 2017-03-18 ENCOUNTER — Ambulatory Visit: Payer: Self-pay | Admitting: Adult Health

## 2017-04-22 ENCOUNTER — Emergency Department (HOSPITAL_COMMUNITY)
Admission: EM | Admit: 2017-04-22 | Discharge: 2017-04-22 | Disposition: A | Discharge status: Home / Self Care | Payer: Medicare Other | Attending: Emergency Medicine | Admitting: Emergency Medicine

## 2017-04-22 ENCOUNTER — Emergency Department (HOSPITAL_COMMUNITY)
Admission: EM | Admit: 2017-04-22 | Discharge: 2017-04-22 | Discharge status: Against Medical Advice | Payer: Medicare Other | Source: Home / Self Care | Attending: Emergency Medicine | Admitting: Emergency Medicine

## 2017-04-22 ENCOUNTER — Encounter (HOSPITAL_COMMUNITY): Payer: Self-pay

## 2017-04-22 ENCOUNTER — Emergency Department (HOSPITAL_COMMUNITY): Payer: Medicare Other

## 2017-04-22 DIAGNOSIS — M549 Dorsalgia, unspecified: Secondary | ICD-10-CM

## 2017-04-22 DIAGNOSIS — E039 Hypothyroidism, unspecified: Secondary | ICD-10-CM | POA: Insufficient documentation

## 2017-04-22 DIAGNOSIS — I10 Essential (primary) hypertension: Secondary | ICD-10-CM | POA: Diagnosis not present

## 2017-04-22 DIAGNOSIS — J449 Chronic obstructive pulmonary disease, unspecified: Secondary | ICD-10-CM | POA: Diagnosis not present

## 2017-04-22 DIAGNOSIS — M545 Low back pain: Secondary | ICD-10-CM | POA: Diagnosis present

## 2017-04-22 DIAGNOSIS — Z79899 Other long term (current) drug therapy: Secondary | ICD-10-CM | POA: Diagnosis not present

## 2017-04-22 DIAGNOSIS — Z5321 Procedure and treatment not carried out due to patient leaving prior to being seen by health care provider: Secondary | ICD-10-CM | POA: Insufficient documentation

## 2017-04-22 DIAGNOSIS — Z853 Personal history of malignant neoplasm of breast: Secondary | ICD-10-CM | POA: Insufficient documentation

## 2017-04-22 DIAGNOSIS — M546 Pain in thoracic spine: Secondary | ICD-10-CM | POA: Insufficient documentation

## 2017-04-22 MED ORDER — KETOROLAC TROMETHAMINE 60 MG/2ML IM SOLN
60.0000 mg | Freq: Once | INTRAMUSCULAR | Status: AC
  Administered 2017-04-22: 60 mg via INTRAMUSCULAR
  Filled 2017-04-22: qty 2

## 2017-04-22 MED ORDER — PREDNISONE 5 MG PO TABS: 5.0000 mg | ORAL_TABLET | Freq: Every day | ORAL | 0 refills | Status: DC

## 2017-04-22 MED ORDER — DICLOFENAC SODIUM 50 MG PO TBEC: 50.0000 mg | DELAYED_RELEASE_TABLET | Freq: Two times a day (BID) | ORAL | 0 refills | Status: DC

## 2017-04-22 NOTE — ED Triage Notes (Addendum)
Complains of lower back pain x8 months. Denies chest pain or shortness of breath. States pain is worse when standing to wash dishes. States she had someone rub her back and pain was relieved.  States she was here earlier and had to leave.

## 2017-04-22 NOTE — ED Triage Notes (Signed)
Patient reports of upper back pain that radiates into neck x8 months. States she herd a pop in right side of neck several months ago although denies any injury. Reports of not being seen previously for issues. Pain increases when standing for period of time of movement.

## 2017-04-22 NOTE — ED Provider Notes (Signed)
[facility] EMERGENCY DEPARTMENT Provider Note   CSN: [id number] Arrival date & time: 04/22/17  1508     History   Chief Complaint Chief Complaint  Patient presents with  . Back Pain    HPI Gina Weber is a 71 y.o. female.  Pain in left upper back for several months worse with movement and palpation.  She is bending over frequently to do her activities of daily living.  No substernal chest pain, dyspnea, diaphoresis, nausea.  No history of cardiac disease.  She has tried over-the-counter medication with minimal relief.  Severity is moderate.      Past Medical History:  Diagnosis Date  . Allergy   . Anxiety   . Arthritis   . Breast cancer [facility]) 1987 & 2006   radiation therapy  . COPD (chronic obstructive pulmonary disease) ([place])   . Depression   . Diverticulosis   . Fibromyalgia   . GERD (gastroesophageal reflux disease)   . Hyperlipidemia   . Hypertension   . Osteopenia 06/12/2013  . Palpitations   . Thyroid disease     Patient Active Problem List   Diagnosis Date Noted  . Osteopenia 06/12/2013  . Pure hypercholesterolemia 01/17/2013  . Chest pain 09/06/2012  . Anxiety 08/02/2012  . Hypothyroidism 04/01/2012  . RLS (restless legs syndrome) 12/29/2010  . DIVERTICULOSIS OF COLON 02/05/2010  . COPD 10/31/2009  . HYPERLIPIDEMIA 01/30/2008  . DEPRESSION 01/30/2008  . HYPERTENSION 11/27/2007  . ALLERGIC RHINITIS 11/27/2007  . GERD 11/27/2007  . BREAST CANCER, HX OF 11/27/2007    Past Surgical History:  Procedure Laterality Date  . ABDOMINAL HYSTERECTOMY    . APPENDECTOMY    . CATARACT EXTRACTION     left eye  . MASTECTOMY  1987   bilateral f/u reconstruction    OB History    No data available       Home Medications    Prior to Admission medications   Medication Sig Start Date End Date Taking? Authorizing Provider  ALPRAZolam Duanne Moron) 0.5 MG tablet Take 1 tablet (0.5 mg total) by mouth at bedtime as needed. For sleep 01/20/15  Yes Dutch Quint B, FNP  amitriptyline (ELAVIL) 10 MG tablet Take 2 tablets (20 mg total) by mouth at bedtime. 01/21/14  Yes Dutch Quint B, FNP  amLODipine (NORVASC) 5 MG tablet Take 1 tablet (5 mg total) by mouth daily. 02/14/15  Yes Lendon Colonel, NP  Jearl Klinefelter ELLIPTA 62.5-25 MCG/INH AEPB  11/28/14  Yes [provider]  ARNUITY ELLIPTA 100 MCG/ACT AEPB  12/26/14  Yes [provider]  esomeprazole (NEXIUM) 40 MG capsule Take 1 capsule (40 mg total) by mouth daily. 02/04/15  Yes Dutch Quint B, FNP  levothyroxine (LEVOTHROID) 25 MCG tablet Take 1 tablet (25 mcg total) by mouth daily. 01/21/14  Yes Dutch Quint B, FNP  losartan (COZAAR) 100 MG tablet Take 1 tablet (100 mg total) by mouth daily. 02/04/15  Yes Dutch Quint B, FNP  meloxicam (MOBIC) 7.5 MG tablet Take 1 tablet (7.5 mg total) by mouth daily. 07/22/14  Yes Dutch Quint B, FNP  metoprolol tartrate (LOPRESSOR) 25 MG tablet Take 0.5 tablets (12.5 mg total) by mouth 2 (two) times daily. 08/25/15  Yes Lendon Colonel, NP  montelukast (SINGULAIR) 10 MG tablet Take 1 tablet by mouth daily. 01/21/14  Yes [provider]  potassium chloride (K-DUR) 10 MEQ tablet Take 1 tablet (10 mEq total) by mouth 2 (two) times daily. 10/31/14  Yes Kennyth Arnold, FNP  diclofenac (VOLTAREN) 50 MG EC tablet Take 1 tablet (50 mg total) by mouth 2 (two) times daily. 04/22/17   Nat Christen, MD  DULoxetine (CYMBALTA) 30 MG capsule Take 2 capsules (60 mg total) by mouth daily. 08/01/14 08/01/15  Kennyth Arnold, FNP  predniSONE (DELTASONE) 5 MG tablet Take 1 tablet (5 mg total) by mouth daily with breakfast. 2 tablets for 5 days; 1 tablet for 5 days 04/22/17   Nat Christen, MD    Family History Family History  Problem Relation Age of Onset  . Cancer Mother 46       esophageal   . Aneurysm Father        AAA  . Heart disease Father   . Depression Son        bipolar  . Depression Paternal Aunt   . Colon cancer Neg Hx     Social History Social  History  Substance Use Topics  . Smoking status: Never Smoker  . Smokeless tobacco: Never Used  . Alcohol use No     Allergies   Seroquel [quetiapine fumarate]; Pravastatin sodium; Statins; Sulfa antibiotics; and Sulfamethoxazole   Review of Systems Review of Systems  All other systems reviewed and are negative.    Physical Exam Updated Vital Signs BP 124/69 (BP Location: Right Arm)   Pulse 80   Temp 98.4 F (36.9 C) (Oral)   Resp 16   Ht 5\' 5"  (1.651 m)   Wt 72.6 kg (160 lb)   SpO2 92%   BMI 26.63 kg/m   Physical Exam  Constitutional: She is oriented to person, place, and time. She appears well-developed and well-nourished.  HENT:  Head: Normocephalic and atraumatic.  Eyes: Conjunctivae are normal.  Neck: Neck supple.  Cardiovascular: Normal rate and regular rhythm.   Pulmonary/Chest: Effort normal and breath sounds normal.  Abdominal: Soft. Bowel sounds are normal.  Musculoskeletal:  Tender to the left of thoracic 5, 6, 7 vertebrae  Neurological: She is alert and oriented to person, place, and time.  Skin: Skin is warm and dry.  Psychiatric: She has a normal mood and affect. Her behavior is normal.  Nursing note and vitals reviewed.    ED Treatments / Results  Labs (all labs ordered are listed, but only abnormal results are displayed) Labs Reviewed - No data to display  EKG  EKG Interpretation None       Radiology Dg Thoracic Spine 2 View  Result Date: 04/22/2017 CLINICAL DATA:  Upper lower back pain for 8 months. No known injury. History of breast cancer. EXAM: THORACIC SPINE 2 VIEWS COMPARISON:  Chest radiographs 08/30/2015.  Chest CT 06/01/2013. FINDINGS: Twelve rib-bearing thoracic type vertebral bodies. The alignment is normal. No evidence of acute fracture, focal bone lesion or widening of the interpedicular distance. There are mild degenerative changes. The paraspinal soft tissues appear unremarkable. IMPRESSION: No acute osseous findings.   Mild degenerative changes. Electronically Signed   By: Richardean Sale M.D.   On: 04/22/2017 16:44    Procedures Procedures (including critical care time)  Medications Ordered in ED Medications  ketorolac (TORADOL) injection 60 mg (60 mg Intramuscular Given 04/22/17 1609)     Initial Impression / Assessment and Plan / ED Course  I have reviewed the triage vital signs and the nursing notes.  Pertinent labs & imaging results that were available during my care of the patient were reviewed by me and considered in my medical decision making (see chart for details).     Patient is  in no acute distress.  Plain films of the thoracic spine show no acute osseous findings.  There are mild degenerative changes.  Will prescribe low-dose prednisone and Voltaren.  Final Clinical Impressions(s) / ED Diagnoses   Final diagnoses:  Left-sided thoracic back pain, unspecified chronicity    New Prescriptions Discharge Medication List as of 04/22/2017  6:03 PM    START taking these medications   Details  diclofenac (VOLTAREN) 50 MG EC tablet Take 1 tablet (50 mg total) by mouth 2 (two) times daily., Starting Fri 04/22/2017, Print    predniSONE (DELTASONE) 5 MG tablet Take 1 tablet (5 mg total) by mouth daily with breakfast. 2 tablets for 5 days; 1 tablet for 5 days, Starting Fri 04/22/2017, Print         Nat Christen, MD 04/22/17 1906

## 2017-04-22 NOTE — Discharge Instructions (Signed)
X-ray shows arthritis, but no acute findings.  Prescription for anti-inflammatory medicine and prednisone.

## 2017-04-22 NOTE — ED Notes (Signed)
Pt states she has things to take care of and cannot stay and be seen.  Ambulatory out of department in no distress.

## 2018-09-04 ENCOUNTER — Emergency Department (HOSPITAL_COMMUNITY): Payer: Medicare PPO

## 2018-09-04 ENCOUNTER — Other Ambulatory Visit: Payer: Self-pay

## 2018-09-04 ENCOUNTER — Emergency Department (HOSPITAL_COMMUNITY)
Admission: EM | Admit: 2018-09-04 | Discharge: 2018-09-04 | Disposition: A | Discharge status: Home / Self Care | Payer: Medicare PPO | Attending: Emergency Medicine | Admitting: Emergency Medicine

## 2018-09-04 ENCOUNTER — Encounter (HOSPITAL_COMMUNITY): Payer: Self-pay | Admitting: Emergency Medicine

## 2018-09-04 DIAGNOSIS — Z853 Personal history of malignant neoplasm of breast: Secondary | ICD-10-CM | POA: Diagnosis not present

## 2018-09-04 DIAGNOSIS — R531 Weakness: Secondary | ICD-10-CM | POA: Diagnosis not present

## 2018-09-04 DIAGNOSIS — E039 Hypothyroidism, unspecified: Secondary | ICD-10-CM | POA: Diagnosis not present

## 2018-09-04 DIAGNOSIS — I10 Essential (primary) hypertension: Secondary | ICD-10-CM | POA: Insufficient documentation

## 2018-09-04 DIAGNOSIS — Z9013 Acquired absence of bilateral breasts and nipples: Secondary | ICD-10-CM | POA: Insufficient documentation

## 2018-09-04 DIAGNOSIS — R0602 Shortness of breath: Secondary | ICD-10-CM | POA: Diagnosis present

## 2018-09-04 DIAGNOSIS — J449 Chronic obstructive pulmonary disease, unspecified: Secondary | ICD-10-CM | POA: Insufficient documentation

## 2018-09-04 DIAGNOSIS — Z923 Personal history of irradiation: Secondary | ICD-10-CM | POA: Insufficient documentation

## 2018-09-04 DIAGNOSIS — Z79899 Other long term (current) drug therapy: Secondary | ICD-10-CM | POA: Insufficient documentation

## 2018-09-04 DIAGNOSIS — J209 Acute bronchitis, unspecified: Secondary | ICD-10-CM | POA: Diagnosis not present

## 2018-09-04 DIAGNOSIS — E876 Hypokalemia: Secondary | ICD-10-CM | POA: Diagnosis not present

## 2018-09-04 LAB — BASIC METABOLIC PANEL
Anion gap: 9 (ref 5–15)
BUN: 9 mg/dL (ref 8–23)
CHLORIDE: 105 mmol/L (ref 98–111)
CO2: 23 mmol/L (ref 22–32)
CREATININE: 0.83 mg/dL (ref 0.44–1.00)
Calcium: 8.6 mg/dL — ABNORMAL LOW (ref 8.9–10.3)
GFR calc non Af Amer: >60 mL/min (ref >60)
Glucose, Bld: 125 mg/dL — ABNORMAL HIGH (ref 70–99)
POTASSIUM: 2.8 mmol/L — AB (ref 3.5–5.1)
SODIUM: 137 mmol/L (ref 135–145)

## 2018-09-04 LAB — CBC WITH DIFFERENTIAL/PLATELET
ABS IMMATURE GRANULOCYTES: 0.05 10*3/uL (ref 0.00–0.07)
Basophils Absolute: 0 10*3/uL (ref 0.0–0.1)
Basophils Relative: 0 %
Eosinophils Absolute: 0.1 10*3/uL (ref 0.0–0.5)
Eosinophils Relative: 1 %
HCT: 38.2 % (ref 36.0–46.0)
HEMOGLOBIN: 12.9 g/dL (ref 12.0–15.0)
Immature Granulocytes: 0 %
LYMPHS PCT: 7 %
Lymphs Abs: 0.9 10*3/uL (ref 0.7–4.0)
MCH: 30.1 pg (ref 26.0–34.0)
MCHC: 33.8 g/dL (ref 30.0–36.0)
MCV: 89 fL (ref 80.0–100.0)
Monocytes Absolute: 1 10*3/uL (ref 0.1–1.0)
Monocytes Relative: 8 %
NEUTROS ABS: 9.8 10*3/uL — AB (ref 1.7–7.7)
NRBC: 0 % (ref 0.0–0.2)
Neutrophils Relative %: 84 %
Platelets: 234 10*3/uL (ref 150–400)
RBC: 4.29 MIL/uL (ref 3.87–5.11)
RDW: 13.1 % (ref 11.5–15.5)
WBC: 11.8 10*3/uL — AB (ref 4.0–10.5)

## 2018-09-04 LAB — URINALYSIS, ROUTINE W REFLEX MICROSCOPIC
Bilirubin Urine: NEGATIVE
Glucose, UA: NEGATIVE mg/dL
Hgb urine dipstick: NEGATIVE
Ketones, ur: NEGATIVE mg/dL
Leukocytes,Ua: NEGATIVE
Nitrite: NEGATIVE
Protein, ur: NEGATIVE mg/dL
Specific Gravity, Urine: 1.005 (ref 1.005–1.030)
pH: 6 (ref 5.0–8.0)

## 2018-09-04 LAB — MAGNESIUM: MAGNESIUM: 1.9 mg/dL (ref 1.7–2.4)

## 2018-09-04 MED ORDER — POTASSIUM CHLORIDE CRYS ER 20 MEQ PO TBCR
40.0000 meq | EXTENDED_RELEASE_TABLET | Freq: Once | ORAL | Status: AC
  Administered 2018-09-04: 40 meq via ORAL
  Filled 2018-09-04: qty 2

## 2018-09-04 MED ORDER — POTASSIUM CHLORIDE CRYS ER 20 MEQ PO TBCR: 20.0000 meq | EXTENDED_RELEASE_TABLET | Freq: Every day | ORAL | 0 refills | Status: DC

## 2018-09-04 MED ORDER — SODIUM CHLORIDE 0.9 % IV BOLUS
500.0000 mL | Freq: Once | INTRAVENOUS | Status: AC
  Administered 2018-09-04: 500 mL via INTRAVENOUS

## 2018-09-04 MED ORDER — ALBUTEROL SULFATE (2.5 MG/3ML) 0.083% IN NEBU
5.0000 mg | INHALATION_SOLUTION | Freq: Once | RESPIRATORY_TRACT | Status: AC
  Administered 2018-09-04: 5 mg via RESPIRATORY_TRACT
  Filled 2018-09-04: qty 6

## 2018-09-04 MED ORDER — IPRATROPIUM BROMIDE 0.02 % IN SOLN
0.5000 mg | Freq: Once | RESPIRATORY_TRACT | Status: AC
  Administered 2018-09-04: 0.5 mg via RESPIRATORY_TRACT
  Filled 2018-09-04: qty 2.5

## 2018-09-04 MED ORDER — DEXAMETHASONE SODIUM PHOSPHATE 10 MG/ML IJ SOLN
10.0000 mg | Freq: Once | INTRAMUSCULAR | Status: AC
  Administered 2018-09-04: 10 mg via INTRAVENOUS
  Filled 2018-09-04: qty 1

## 2018-09-04 MED ORDER — POTASSIUM CHLORIDE 10 MEQ/100ML IV SOLN
10.0000 meq | Freq: Once | INTRAVENOUS | Status: AC
  Administered 2018-09-04: 10 meq via INTRAVENOUS
  Filled 2018-09-04: qty 100

## 2018-09-04 NOTE — Discharge Instructions (Signed)
Use albuterol as needed for cough and wheezing. Take potassium supplements and have blood work rechecked by primary doctor end of this week. Stay well-hydrated follow-up with your primary doctor or return for worsening or new symptoms.

## 2018-09-04 NOTE — ED Provider Notes (Signed)
[facility] EMERGENCY DEPARTMENT Provider Note   CSN: [id number] Arrival date & time: 09/04/18  1146    History   Chief Complaint Chief Complaint  Patient presents with  . Shortness of Breath    HPI Gina Weber is a 73 y.o. female.     Patient presents with general fatigue, general weakness, mild shortness of breath, recurrent cough for over 2 weeks.  Patient did take care of her sick husband for years and she feels she is not able to take care of herself.  Patient does have a history of COPD not on oxygen.  Patient recently finished antibiotics for possible pneumonia 2 weeks ago.  No chest pain.  No blood clot history, none no leg swelling or recent surgeries     Past Medical History:  Diagnosis Date  . Allergy   . Anxiety   . Arthritis   . Breast cancer [facility]) 1987 & 2006   radiation therapy  . COPD (chronic obstructive pulmonary disease) ([place])   . Depression   . Diverticulosis   . Fibromyalgia   . GERD (gastroesophageal reflux disease)   . Hyperlipidemia   . Hypertension   . Osteopenia 06/12/2013  . Palpitations   . Thyroid disease     Patient Active Problem List   Diagnosis Date Noted  . Osteopenia 06/12/2013  . Pure hypercholesterolemia 01/17/2013  . Chest pain 09/06/2012  . Anxiety 08/02/2012  . Hypothyroidism 04/01/2012  . RLS (restless legs syndrome) 12/29/2010  . DIVERTICULOSIS OF COLON 02/05/2010  . COPD 10/31/2009  . HYPERLIPIDEMIA 01/30/2008  . DEPRESSION 01/30/2008  . HYPERTENSION 11/27/2007  . ALLERGIC RHINITIS 11/27/2007  . GERD 11/27/2007  . BREAST CANCER, HX OF 11/27/2007    Past Surgical History:  Procedure Laterality Date  . ABDOMINAL HYSTERECTOMY    . APPENDECTOMY    . CATARACT EXTRACTION     left eye  . MASTECTOMY  1987   bilateral f/u reconstruction     OB History   No obstetric history on file.      Home Medications    Prior to Admission medications   Medication Sig Start Date End Date Taking? Authorizing Provider    ALPRAZolam Duanne Moron) 0.5 MG tablet Take 1 tablet (0.5 mg total) by mouth at bedtime as needed. For sleep 01/20/15   Kennyth Arnold, FNP  amitriptyline (ELAVIL) 10 MG tablet Take 2 tablets (20 mg total) by mouth at bedtime. 01/21/14   Kennyth Arnold, FNP  amLODipine (NORVASC) 5 MG tablet Take 1 tablet (5 mg total) by mouth daily. 02/14/15   Lendon Colonel, NP  Celedonio Miyamoto 62.5-25 MCG/INH AEPB  11/28/14   [provider]  ARNUITY ELLIPTA 100 MCG/ACT AEPB  12/26/14   [provider]  diclofenac (VOLTAREN) 50 MG EC tablet Take 1 tablet (50 mg total) by mouth 2 (two) times daily. 04/22/17   Nat Christen, MD  DULoxetine (CYMBALTA) 30 MG capsule Take 2 capsules (60 mg total) by mouth daily. 08/01/14 08/01/15  Kennyth Arnold, FNP  esomeprazole (NEXIUM) 40 MG capsule Take 1 capsule (40 mg total) by mouth daily. 02/04/15   Dutch Quint B, FNP  levothyroxine (LEVOTHROID) 25 MCG tablet Take 1 tablet (25 mcg total) by mouth daily. 01/21/14   Kennyth Arnold, FNP  losartan (COZAAR) 100 MG tablet Take 1 tablet (100 mg total) by mouth daily. 02/04/15   Kennyth Arnold, FNP  meloxicam (MOBIC) 7.5 MG tablet Take 1 tablet (7.5 mg total) by mouth daily. 07/22/14  Dutch Quint B, FNP  metoprolol tartrate (LOPRESSOR) 25 MG tablet Take 0.5 tablets (12.5 mg total) by mouth 2 (two) times daily. 08/25/15   Lendon Colonel, NP  montelukast (SINGULAIR) 10 MG tablet Take 1 tablet by mouth daily. 01/21/14   [provider]  potassium chloride (K-DUR) 10 MEQ tablet Take 1 tablet (10 mEq total) by mouth 2 (two) times daily. 10/31/14   Dutch Quint B, FNP  potassium chloride SA (K-DUR,KLOR-CON) 20 MEQ tablet Take 1 tablet (20 mEq total) by mouth daily. 09/04/18   Elnora Morrison, MD  predniSONE (DELTASONE) 5 MG tablet Take 1 tablet (5 mg total) by mouth daily with breakfast. 2 tablets for 5 days; 1 tablet for 5 days 04/22/17   Nat Christen, MD    Family History Family History  Problem Relation Age of Onset   . Cancer Mother 10       esophageal   . Aneurysm Father        AAA  . Heart disease Father   . Depression Son        bipolar  . Depression Paternal Aunt   . Colon cancer Neg Hx     Social History Social History   Tobacco Use  . Smoking status: Never Smoker  . Smokeless tobacco: Never Used  Substance Use Topics  . Alcohol use: No    Alcohol/week: 0.0 standard drinks  . Drug use: No     Allergies   Seroquel [quetiapine fumarate]; Pravastatin sodium; Statins; Sulfa antibiotics; and Sulfamethoxazole   Review of Systems Review of Systems  Constitutional: Positive for appetite change and fatigue. Negative for chills and fever.  HENT: Negative for congestion.   Eyes: Negative for visual disturbance.  Respiratory: Positive for cough and shortness of breath.   Cardiovascular: Negative for chest pain.  Gastrointestinal: Positive for nausea. Negative for abdominal pain and vomiting.  Genitourinary: Negative for dysuria and flank pain.  Musculoskeletal: Negative for back pain, neck pain and neck stiffness.  Skin: Negative for rash.  Neurological: Positive for weakness and light-headedness. Negative for headaches.     Physical Exam Updated Vital Signs BP 135/85   Pulse 85   Temp 98.3 F (36.8 C) (Oral)   Resp 15   Ht 5\' 5"  (1.651 m)   Wt 61.2 kg   SpO2 95%   BMI 22.47 kg/m   Physical Exam Vitals signs and nursing note reviewed.  Constitutional:      Appearance: She is well-developed.  HENT:     Head: Normocephalic and atraumatic.     Comments: Dry mm Eyes:     General:        Right eye: No discharge.        Left eye: No discharge.     Conjunctiva/sclera: Conjunctivae normal.  Neck:     Musculoskeletal: Normal range of motion and neck supple.     Trachea: No tracheal deviation.  Cardiovascular:     Rate and Rhythm: Normal rate and regular rhythm.  Pulmonary:     Effort: Pulmonary effort is normal.     Breath sounds: Wheezing present.  Abdominal:      General: There is no distension.     Palpations: Abdomen is soft.     Tenderness: There is no abdominal tenderness. There is no guarding.  Musculoskeletal:     Right lower leg: No edema.     Left lower leg: No edema.  Skin:    General: Skin is warm.     Findings: No  rash.  Neurological:     Mental Status: She is alert and oriented to person, place, and time.     GCS: GCS eye subscore is 4. GCS verbal subscore is 5. GCS motor subscore is 6.     Cranial Nerves: Cranial nerves are intact.     Motor: Motor function is intact.      ED Treatments / Results  Labs (all labs ordered are listed, but only abnormal results are displayed) Labs Reviewed  CBC WITH DIFFERENTIAL/PLATELET - Abnormal; Notable for the following components:      Result Value   WBC 11.8 (*)    Neutro Abs 9.8 (*)    All other components within normal limits  BASIC METABOLIC PANEL - Abnormal; Notable for the following components:   Potassium 2.8 (*)    Glucose, Bld 125 (*)    Calcium 8.6 (*)    All other components within normal limits  MAGNESIUM  URINALYSIS, ROUTINE W REFLEX MICROSCOPIC    EKG EKG Interpretation  Date/Time:  Monday September 04 2018 13:19:50 EDT Ventricular Rate:  84 PR Interval:    QRS Duration: 90 QT Interval:  378 QTC Calculation: 447 R Axis:   82 Text Interpretation:  Sinus rhythm Borderline right axis deviation Borderline T wave abnormalities Confirmed by Elnora Morrison [phone]) on 09/04/2018 1:59:11 PM   Radiology Dg Chest 2 View  Result Date: 09/04/2018 CLINICAL DATA:  Shortness of breath with weakness and night sweats for several weeks. History of breast cancer. EXAM: CHEST - 2 VIEW COMPARISON:  Radiographs 08/30/2015. FINDINGS: The heart size and mediastinal contours are stable. There is stable biapical scarring, right greater than left. The lungs are otherwise clear. There is no pleural effusion or pneumothorax. There are stable postsurgical changes in both breasts and the right  axilla. No acute osseous findings. Telemetry leads overlie the chest. IMPRESSION: Stable radiographic appearance of the chest with biapical scarring. No acute cardiopulmonary process identified. Electronically Signed   By: Richardean Sale M.D.   On: 09/04/2018 14:34    Procedures Procedures (including critical care time)  Medications Ordered in ED Medications  potassium chloride 10 mEq in 100 mL IVPB (10 mEq Intravenous New Bag/Given 09/04/18 1430)  sodium chloride 0.9 % bolus 500 mL (0 mLs Intravenous Stopped 09/04/18 1427)  dexamethasone (DECADRON) injection 10 mg (10 mg Intravenous Given 09/04/18 1325)  albuterol (PROVENTIL) (2.5 MG/3ML) 0.083% nebulizer solution 5 mg (5 mg Nebulization Given 09/04/18 1321)  ipratropium (ATROVENT) nebulizer solution 0.5 mg (0.5 mg Nebulization Given 09/04/18 1321)  potassium chloride SA (K-DUR,KLOR-CON) CR tablet 40 mEq (40 mEq Oral Given 09/04/18 1430)     Initial Impression / Assessment and Plan / ED Course  I have reviewed the triage vital signs and the nursing notes.  Pertinent labs & imaging results that were available during my care of the patient were reviewed by me and considered in my medical decision making (see chart for details).      Patient presents with gradually worsening symptoms for a few weeks.  Clinically concern for viral syndrome with mild COPD exacerbation.  IV fluids given with decreased appetite, IV and oral potassium given with blood work reviewed and hypokalemia magnesium added.  Chest x-ray reviewed no acute findings.  Patient will need close outpatient follow-up repeat blood work outpatient.  Oral fluids in the ED.  Patient given IV steroids and nebulizer in the ER. Patient improved on reassessment.  Patient will be discharged after IV potassium, oral potassium given.  Discussed importance  of follow-up with a primary doctor for reassessment within a week.    Results and differential diagnosis were discussed with the  patient/parent/guardian. Xrays were independently reviewed by myself.  Close follow up outpatient was discussed, comfortable with the plan.   Medications  potassium chloride 10 mEq in 100 mL IVPB (10 mEq Intravenous New Bag/Given 09/04/18 1430)  sodium chloride 0.9 % bolus 500 mL (0 mLs Intravenous Stopped 09/04/18 1427)  dexamethasone (DECADRON) injection 10 mg (10 mg Intravenous Given 09/04/18 1325)  albuterol (PROVENTIL) (2.5 MG/3ML) 0.083% nebulizer solution 5 mg (5 mg Nebulization Given 09/04/18 1321)  ipratropium (ATROVENT) nebulizer solution 0.5 mg (0.5 mg Nebulization Given 09/04/18 1321)  potassium chloride SA (K-DUR,KLOR-CON) CR tablet 40 mEq (40 mEq Oral Given 09/04/18 1430)    Vitals:   09/04/18 1217 09/04/18 1219 09/04/18 1319 09/04/18 1321  BP:  118/63 135/85   Pulse:  100 85   Resp:  20 15   Temp:  98.3 F (36.8 C)    TempSrc:  Oral    SpO2:  96% 95% 95%  Weight: 61.2 kg     Height: 5\' 5"  (1.651 m)       Final diagnoses:  Hypokalemia  General weakness  Acute bronchitis, unspecified organism    Final Clinical Impressions(s) / ED Diagnoses   Final diagnoses:  Hypokalemia  General weakness  Acute bronchitis, unspecified organism    ED Discharge Orders         Ordered    potassium chloride SA (K-DUR,KLOR-CON) 20 MEQ tablet  Daily     09/04/18 1510           Elnora Morrison, MD 09/04/18 1512

## 2018-09-04 NOTE — ED Notes (Signed)
Patient given discharge instruction, verbalized understand. IV removed, band aid applied. Patient ambulatory out of the department.  

## 2018-09-04 NOTE — ED Triage Notes (Signed)
Shortness of breath, weakness, night sweats for several weeks.  Pt states she does not have a pcp.

## 2018-09-04 NOTE — ED Notes (Signed)
RT at the bedside.

## 2019-08-30 ENCOUNTER — Emergency Department (HOSPITAL_COMMUNITY): Payer: Medicare Other

## 2019-08-30 ENCOUNTER — Emergency Department (HOSPITAL_COMMUNITY)
Admission: EM | Admit: 2019-08-30 | Discharge: 2019-08-30 | Disposition: A | Discharge status: Home / Self Care | Payer: Medicare Other | Attending: Emergency Medicine | Admitting: Emergency Medicine

## 2019-08-30 ENCOUNTER — Other Ambulatory Visit: Payer: Self-pay

## 2019-08-30 DIAGNOSIS — Z79899 Other long term (current) drug therapy: Secondary | ICD-10-CM | POA: Insufficient documentation

## 2019-08-30 DIAGNOSIS — Z20822 Contact with and (suspected) exposure to covid-19: Secondary | ICD-10-CM | POA: Diagnosis not present

## 2019-08-30 DIAGNOSIS — R0602 Shortness of breath: Secondary | ICD-10-CM | POA: Diagnosis not present

## 2019-08-30 DIAGNOSIS — R5383 Other fatigue: Secondary | ICD-10-CM | POA: Insufficient documentation

## 2019-08-30 DIAGNOSIS — E039 Hypothyroidism, unspecified: Secondary | ICD-10-CM | POA: Insufficient documentation

## 2019-08-30 DIAGNOSIS — J449 Chronic obstructive pulmonary disease, unspecified: Secondary | ICD-10-CM | POA: Insufficient documentation

## 2019-08-30 DIAGNOSIS — I10 Essential (primary) hypertension: Secondary | ICD-10-CM | POA: Diagnosis not present

## 2019-08-30 DIAGNOSIS — R079 Chest pain, unspecified: Secondary | ICD-10-CM

## 2019-08-30 LAB — CBC
HCT: 38 % (ref 36.0–46.0)
Hemoglobin: 13.1 g/dL (ref 12.0–15.0)
MCH: 30.5 pg (ref 26.0–34.0)
MCHC: 34.5 g/dL (ref 30.0–36.0)
MCV: 88.4 fL (ref 80.0–100.0)
Platelets: 237 10*3/uL (ref 150–400)
RBC: 4.3 MIL/uL (ref 3.87–5.11)
RDW: 12.7 % (ref 11.5–15.5)
WBC: 10.3 10*3/uL (ref 4.0–10.5)
nRBC: 0 % (ref 0.0–0.2)

## 2019-08-30 LAB — BASIC METABOLIC PANEL
Anion gap: 9 (ref 5–15)
BUN: 12 mg/dL (ref 8–23)
CO2: 24 mmol/L (ref 22–32)
Calcium: 9.2 mg/dL (ref 8.9–10.3)
Chloride: 103 mmol/L (ref 98–111)
Creatinine, Ser: 1 mg/dL (ref 0.44–1.00)
GFR calc Af Amer: >60 mL/min (ref >60)
GFR calc non Af Amer: 55 mL/min — ABNORMAL LOW (ref >60)
Glucose, Bld: 106 mg/dL — ABNORMAL HIGH (ref 70–99)
Potassium: 3.4 mmol/L — ABNORMAL LOW (ref 3.5–5.1)
Sodium: 136 mmol/L (ref 135–145)

## 2019-08-30 LAB — TROPONIN I (HIGH SENSITIVITY)
Troponin I (High Sensitivity): 4 ng/L (ref <18)
Troponin I (High Sensitivity): 3 ng/L (ref <18)

## 2019-08-30 MED ORDER — IOHEXOL 350 MG/ML SOLN
100.0000 mL | Freq: Once | INTRAVENOUS | Status: AC | PRN
  Administered 2019-08-30: 100 mL via INTRAVENOUS

## 2019-08-30 MED ORDER — SODIUM CHLORIDE 0.9 % IV BOLUS
1000.0000 mL | Freq: Once | INTRAVENOUS | Status: AC
  Administered 2019-08-30: 1000 mL via INTRAVENOUS

## 2019-08-30 NOTE — ED Provider Notes (Signed)
[facility] EMERGENCY DEPARTMENT Provider Note   CSN: [id number] Arrival date & time: 08/30/19  1938     History CC: Fatigue, chest pain  Stefan Blehm is a 74 y.o. female history of COPD, no longer smoking, not on home oxygen, hypertension, hyperlipidemia, presenting to the emergency department with chest pain.  She reports gradual onset of pain in the middle of her chest approximately 3 days ago.  The pain feels like a pressure bag of bricks sitting on her chest.  Does not radiate anywhere.  Is been on and off for the past 3 days.  Is not associated with activity.  However she is also noted that she is been extremely fatigued for the past 3 days.  Says she has no energy.  She is having difficulty even walking around the house.  She typically is the sole care provider for her husband.  She denies any history of DVT or PE.  She is not on anticoagulation.  She denies any history of MI or cardiac stents.  She had an unremarkable cardiac cath in 2014.  She denies any history of angina.  She denies any cough or congestion recently.  She is not on oxygen for COPD.  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  They are in the process of getting set up with a home health aid, but do not currently have one.  She tells me she cannot stay in the hospital today.  HPI     Past Medical History:  Diagnosis Date  . Allergy   . Anxiety   . Arthritis   . Breast cancer [facility]) 1987 & 2006   radiation therapy  . COPD (chronic obstructive pulmonary disease) ([place])   . Depression   . Diverticulosis   . Fibromyalgia   . GERD (gastroesophageal reflux disease)   . Hyperlipidemia   . Hypertension   . Osteopenia 06/12/2013  . Palpitations   . Thyroid disease     Patient Active Problem List   Diagnosis Date Noted  . Osteopenia 06/12/2013  . Pure hypercholesterolemia 01/17/2013  . Chest pain 09/06/2012  . Anxiety  08/02/2012  . Hypothyroidism 04/01/2012  . RLS (restless legs syndrome) 12/29/2010  . DIVERTICULOSIS OF COLON 02/05/2010  . COPD 10/31/2009  . HYPERLIPIDEMIA 01/30/2008  . DEPRESSION 01/30/2008  . HYPERTENSION 11/27/2007  . ALLERGIC RHINITIS 11/27/2007  . GERD 11/27/2007  . BREAST CANCER, HX OF 11/27/2007    Past Surgical History:  Procedure Laterality Date  . ABDOMINAL HYSTERECTOMY    . APPENDECTOMY    . CATARACT EXTRACTION     left eye  . MASTECTOMY  1987   bilateral f/u reconstruction     OB History   No obstetric history on file.     Family History  Problem Relation Age of Onset  . Cancer Mother 47       esophageal   . Aneurysm Father        AAA  . Heart disease Father   . Depression Son        bipolar  . Depression Paternal Aunt   . Colon cancer Neg Hx     Social History   Tobacco Use  . Smoking status: Never Smoker  . Smokeless tobacco: Never Used  Substance Use Topics  . Alcohol use: No    Alcohol/week: 0.0 standard drinks  . Drug use: No    Home Medications Prior to Admission medications  Medication Sig Start Date End Date Taking? Authorizing Provider  ALPRAZolam Duanne Moron) 0.5 MG tablet Take 1 tablet (0.5 mg total) by mouth at bedtime as needed. For sleep 01/20/15   Kennyth Arnold, FNP  amitriptyline (ELAVIL) 10 MG tablet Take 2 tablets (20 mg total) by mouth at bedtime. 01/21/14   Kennyth Arnold, FNP  amLODipine (NORVASC) 5 MG tablet Take 1 tablet (5 mg total) by mouth daily. 02/14/15   Lendon Colonel, NP  Celedonio Miyamoto 62.5-25 MCG/INH AEPB  11/28/14   [provider]  ARNUITY ELLIPTA 100 MCG/ACT AEPB  12/26/14   [provider]  diclofenac (VOLTAREN) 50 MG EC tablet Take 1 tablet (50 mg total) by mouth 2 (two) times daily. 04/22/17   Nat Christen, MD  DULoxetine (CYMBALTA) 30 MG capsule Take 2 capsules (60 mg total) by mouth daily. 08/01/14 08/01/15  Kennyth Arnold, FNP  esomeprazole (NEXIUM) 40 MG capsule Take 1 capsule (40 mg  total) by mouth daily. 02/04/15   Dutch Quint B, FNP  levothyroxine (LEVOTHROID) 25 MCG tablet Take 1 tablet (25 mcg total) by mouth daily. 01/21/14   Kennyth Arnold, FNP  losartan (COZAAR) 100 MG tablet Take 1 tablet (100 mg total) by mouth daily. 02/04/15   Kennyth Arnold, FNP  meloxicam (MOBIC) 7.5 MG tablet Take 1 tablet (7.5 mg total) by mouth daily. 07/22/14   Kennyth Arnold, FNP  metoprolol tartrate (LOPRESSOR) 25 MG tablet Take 0.5 tablets (12.5 mg total) by mouth 2 (two) times daily. 08/25/15   Lendon Colonel, NP  montelukast (SINGULAIR) 10 MG tablet Take 1 tablet by mouth daily. 01/21/14   [provider]  potassium chloride (K-DUR) 10 MEQ tablet Take 1 tablet (10 mEq total) by mouth 2 (two) times daily. 10/31/14   Dutch Quint B, FNP  potassium chloride SA (K-DUR,KLOR-CON) 20 MEQ tablet Take 1 tablet (20 mEq total) by mouth daily. 09/04/18   Elnora Morrison, MD  predniSONE (DELTASONE) 5 MG tablet Take 1 tablet (5 mg total) by mouth daily with breakfast. 2 tablets for 5 days; 1 tablet for 5 days 04/22/17   Nat Christen, MD    Allergies    Seroquel [quetiapine fumarate], Pravastatin sodium, Statins, Sulfa antibiotics, and Sulfamethoxazole  Review of Systems   Review of Systems  Constitutional: Positive for fatigue. Negative for chills and fever.  Respiratory: Positive for shortness of breath. Negative for wheezing.   Cardiovascular: Positive for chest pain. Negative for palpitations.  Gastrointestinal: Negative for abdominal pain, nausea and vomiting.  Skin: Negative for color change and rash.  Neurological: Negative for syncope and light-headedness.  All other systems reviewed and are negative.   Physical Exam Updated Vital Signs BP 98/67   Pulse 94   Temp 99 F (37.2 C) (Oral)   Resp (!) 21   Ht 5\' 5"  (1.651 m)   Wt 59 kg   SpO2 100%   BMI 21.63 kg/m   Physical Exam Vitals and nursing note reviewed.  Constitutional:      General: She is not in acute  distress.    Appearance: She is well-developed.  HENT:     Head: Normocephalic and atraumatic.  Eyes:     Conjunctiva/sclera: Conjunctivae normal.  Cardiovascular:     Rate and Rhythm: Normal rate and regular rhythm.     Heart sounds: Normal heart sounds. No murmur.  Pulmonary:     Effort: Pulmonary effort is normal. No respiratory distress.     Breath sounds: Normal  breath sounds.  Abdominal:     Palpations: Abdomen is soft.     Tenderness: There is no abdominal tenderness.  Musculoskeletal:     Cervical back: Neck supple.  Skin:    General: Skin is warm and dry.  Neurological:     General: No focal deficit present.     Mental Status: She is alert and oriented to person, place, and time.     ED Results / Procedures / Treatments   Labs (all labs ordered are listed, but only abnormal results are displayed) Labs Reviewed  BASIC METABOLIC PANEL - Abnormal; Notable for the following components:      Result Value   Potassium 3.4 (*)    Glucose, Bld 106 (*)    GFR calc non Af Amer 55 (*)    All other components within normal limits  SARS CORONAVIRUS 2 (TAT 6-24 HRS)  CBC  TROPONIN I (HIGH SENSITIVITY)  TROPONIN I (HIGH SENSITIVITY)    EKG EKG Interpretation  Date/Time:  Thursday August 30 2019 20:11:23 EST Ventricular Rate:  115 PR Interval:  116 QRS Duration: 66 QT Interval:  330 QTC Calculation: 456 R Axis:   -33 Text Interpretation: Unusual P axis, possible ectopic atrial tachycardia with Premature atrial complexes Left axis deviation Minimal voltage criteria for LVH, may be normal variant ( Sokolow-Lyon ) Abnormal ECG No STEMI, no sig changes from prior ECG Confirmed by Octaviano Glow [phone]) on 08/30/2019 8:32:48 PM   Radiology CT Angio Chest PE W and/or Wo Contrast  Result Date: 08/30/2019 CLINICAL DATA:  Shortness of breath with exertion. Chest pain. Question pulmonary emboli. EXAM: CT ANGIOGRAPHY CHEST WITH CONTRAST TECHNIQUE: Multidetector CT imaging of the  chest was performed using the standard protocol during bolus administration of intravenous contrast. Multiplanar CT image reconstructions and MIPs were obtained to evaluate the vascular anatomy. CONTRAST:  176mL OMNIPAQUE IOHEXOL 350 MG/ML SOLN COMPARISON:  Chest radiography same day FINDINGS: Cardiovascular: Pulmonary arterial opacification is excellent. There are no pulmonary emboli. Heart size is normal. No coronary calcification detected. Ordinary atherosclerotic calcification of the aorta without aneurysm or dissection. Mediastinum/Nodes: No mass or lymphadenopathy. Lungs/Pleura: No active disease. Chronic scarring of the upper lungs, particularly at the anterolateral upper lung on the right, consistent with radiation change. Upper Abdomen: Negative Musculoskeletal: Ordinary mild thoracic degenerative changes. Review of the MIP images confirms the above findings. IMPRESSION: No pulmonary emboli. Ordinary mild aortic atherosclerosis. Upper lobe pulmonary scarring, more advanced antero laterally on the right, consistent with radiation change. No acute pulmonary process suspected. Electronically Signed   By: Nelson Chimes M.D.   On: 08/30/2019 21:47   DG Chest Port 1 View  Result Date: 08/30/2019 CLINICAL DATA:  74 year old female with chest pain. EXAM: PORTABLE CHEST 1 VIEW COMPARISON:  Chest radiograph dated 09/04/2018. FINDINGS: Probable mild background of emphysema. There are biapical subpleural scarring. Faint area of increased density in the right upper lobe may be artifactual or represent developing infiltrate, possibly atypical. Clinical correlation is recommended. No pleural effusion or pneumothorax. The cardiac silhouette is within normal limits. No acute osseous pathology. IMPRESSION: Artifact versus developing infiltrate in the right upper lobe. Electronically Signed   By: Anner Crete M.D.   On: 08/30/2019 20:53    Procedures Procedures (including critical care time)  Medications Ordered  in ED Medications  iohexol (OMNIPAQUE) 350 MG/ML injection 100 mL (100 mLs Intravenous Contrast Given 08/30/19 2128)  sodium chloride 0.9 % bolus 1,000 mL (0 mLs Intravenous Stopped 08/30/19 2338)  ED Course  I have reviewed the triage vital signs and the nursing notes.  Pertinent labs & imaging results that were available during my care of the patient were reviewed by me and considered in my medical decision making (see chart for details).  74 yo female presenting to the ED with chest pressure, SOB ongoing for 3 days.  She has a hx of COPD, but is not on O2, quit smoking years ago, and is not wheezing on exam (she is moving air quite well).  In the ED she has only minimal symptoms.  She does not appear uncomfortable on exam.  She is not in acute respiratory distress.  She was mildly tachycardia on arrival, raising concern for PE.  Subsequent CT PE did not show PE, nor PNA or viral-type pattern for COVID.  This does not appear to be an acute pulmonary process.  Her ECG is nonischemic, and her delta trops are negative (4, then 3).  This lowers my suspicion for ACS.  However, I did tell her that I find her story quite concerning, and we cannot exclude a cardiac cause of her symptoms.  She needs to see a cardiologist.  Unfortunately, with her difficult home situation caring for her husband, she is unable to stay in the hospital.  With no acute findings today, I felt it was reasonable to discharge her and have her f/u as an outpatient with cardiology.   She thinks this may be related to exhaustion, as caring for her husband is a "24 hour job."  There may be a component of exhaustion, but I cautioned her not to chalk this all up to exhaustion.  She needs more a medical workup.  She understands this.  I advised if she has any other acute concerns at home, call 911 and come back to the ER.   Final Clinical Impression(s) / ED Diagnoses Final diagnoses:  Other fatigue  Shortness of breath    Rx / DC  Orders ED Discharge Orders    None       Wyvonnia Dusky, MD 08/31/19 1236

## 2019-08-30 NOTE — Discharge Instructions (Addendum)
Your work-up today did not show any emergent cause for your symptoms.  Specifically, we do not see signs of a blood clot in your lungs, anemia, heart attack, or infection in your lungs.  I felt it was reasonable to discharge you home, given your circumstances leading to care for your husband.  However it is extremely important that you follow-up as an outpatient for your ER visit.  You may need further work-up.  I would suggest that you make an appointment with a cardiologist if possible.  You can get outpatient testing of your heart, such as an echocardiogram.    If you have a primary care doctor, they may wish to test things like your vitamin levels.  This may affect your mood, energy level, potential your memory, too.  If you have any emergent concerns, if you feel like you are having worsening pain, feeling having difficulty breathing, you can always return immediately to the emergency department.

## 2019-08-30 NOTE — ED Triage Notes (Signed)
Patient complains of chest pain that began 2 days ago. The pain is in the center of her chest and does not radiate, denies N/V, endorses diaphoresis.

## 2019-08-31 LAB — SARS CORONAVIRUS 2 (TAT 6-24 HRS): SARS Coronavirus 2: NEGATIVE

## 2019-10-15 ENCOUNTER — Ambulatory Visit: Payer: Medicare Other | Admitting: Cardiology

## 2019-10-15 NOTE — Progress Notes (Deleted)
Clinical Summary Gina Weber is a 74 y.o.female    1. Palpitations - from notes prior monitor showed just occasoinal PACs and PVCs, isolated episode of SVT -   2. HTN   3. COPD   4. Anxiety - from ER notes significant stress related to health issues of her husband.   5. Chest pain - seen in ER 08/30/19 with chest pain. EKG ectopic atrial tach, LVH. CT PE no PE - trop neg x 2. COVID neg.  Past Medical History:  Diagnosis Date  . Allergy   . Anxiety   . Arthritis   . Breast cancer [facility]) 1987 & 2006   radiation therapy  . COPD (chronic obstructive pulmonary disease) ([place])   . Depression   . Diverticulosis   . Fibromyalgia   . GERD (gastroesophageal reflux disease)   . Hyperlipidemia   . Hypertension   . Osteopenia 06/12/2013  . Palpitations   . Thyroid disease      Allergies  Allergen Reactions  . Seroquel [Quetiapine Fumarate] Anaphylaxis  . Pravastatin Sodium     REACTION: liver enzyme abnormality---per pt's report  . Statins     REACTION: leg pain, elevates liver enzymes  . Sulfa Antibiotics   . Sulfamethoxazole     REACTION: hives     Current Outpatient Medications  Medication Sig Dispense Refill  . ALPRAZolam (XANAX) 0.5 MG tablet Take 1 tablet (0.5 mg total) by mouth at bedtime as needed. For sleep 90 tablet 0  . amitriptyline (ELAVIL) 10 MG tablet Take 2 tablets (20 mg total) by mouth at bedtime. 180 tablet 1  . amLODipine (NORVASC) 5 MG tablet Take 1 tablet (5 mg total) by mouth daily. 30 tablet 6  . ANORO ELLIPTA 62.5-25 MCG/INH AEPB     . ARNUITY ELLIPTA 100 MCG/ACT AEPB     . diclofenac (VOLTAREN) 50 MG EC tablet Take 1 tablet (50 mg total) by mouth 2 (two) times daily. 20 tablet 0  . DULoxetine (CYMBALTA) 30 MG capsule Take 2 capsules (60 mg total) by mouth daily. 180 capsule 1  . esomeprazole (NEXIUM) 40 MG capsule Take 1 capsule (40 mg total) by mouth daily. 90 capsule 0  . levothyroxine (LEVOTHROID) 25 MCG tablet Take 1 tablet (25 mcg  total) by mouth daily. 90 tablet 1  . losartan (COZAAR) 100 MG tablet Take 1 tablet (100 mg total) by mouth daily. 90 tablet 0  . meloxicam (MOBIC) 7.5 MG tablet Take 1 tablet (7.5 mg total) by mouth daily. 90 tablet 1  . metoprolol tartrate (LOPRESSOR) 25 MG tablet Take 0.5 tablets (12.5 mg total) by mouth 2 (two) times daily. 30 tablet 6  . montelukast (SINGULAIR) 10 MG tablet Take 1 tablet by mouth daily.    . potassium chloride (K-DUR) 10 MEQ tablet Take 1 tablet (10 mEq total) by mouth 2 (two) times daily. 60 tablet 2  . potassium chloride SA (K-DUR,KLOR-CON) 20 MEQ tablet Take 1 tablet (20 mEq total) by mouth daily. 3 tablet 0  . predniSONE (DELTASONE) 5 MG tablet Take 1 tablet (5 mg total) by mouth daily with breakfast. 2 tablets for 5 days; 1 tablet for 5 days 15 tablet 0   No current facility-administered medications for this visit.     Past Surgical History:  Procedure Laterality Date  . ABDOMINAL HYSTERECTOMY    . APPENDECTOMY    . CATARACT EXTRACTION     left eye  . MASTECTOMY  1987   bilateral f/u reconstruction  Allergies  Allergen Reactions  . Seroquel [Quetiapine Fumarate] Anaphylaxis  . Pravastatin Sodium     REACTION: liver enzyme abnormality---per pt's report  . Statins     REACTION: leg pain, elevates liver enzymes  . Sulfa Antibiotics   . Sulfamethoxazole     REACTION: hives      Family History  Problem Relation Age of Onset  . Cancer Mother 23       esophageal   . Aneurysm Father        AAA  . Heart disease Father   . Depression Son        bipolar  . Depression Paternal Aunt   . Colon cancer Neg Hx      Social History Gina Weber reports that she has never smoked. She has never used smokeless tobacco. Gina Weber reports no history of alcohol use.   Review of Systems CONSTITUTIONAL: No weight loss, fever, chills, weakness or fatigue.  HEENT: Eyes: No visual loss, blurred vision, double vision or yellow sclerae.No hearing loss, sneezing,  congestion, runny nose or sore throat.  SKIN: No rash or itching.  CARDIOVASCULAR:  RESPIRATORY: No shortness of breath, cough or sputum.  GASTROINTESTINAL: No anorexia, nausea, vomiting or diarrhea. No abdominal pain or blood.  GENITOURINARY: No burning on urination, no polyuria NEUROLOGICAL: No headache, dizziness, syncope, paralysis, ataxia, numbness or tingling in the extremities. No change in bowel or bladder control.  MUSCULOSKELETAL: No muscle, back pain, joint pain or stiffness.  LYMPHATICS: No enlarged nodes. No history of splenectomy.  PSYCHIATRIC: No history of depression or anxiety.  ENDOCRINOLOGIC: No reports of sweating, cold or heat intolerance. No polyuria or polydipsia.  [place] Kitchen   Physical Examination There were no vitals filed for this visit. There were no vitals filed for this visit.  Gen: resting comfortably, no acute distress HEENT: no scleral icterus, pupils equal round and reactive, no palptable cervical adenopathy,  CV Resp: Clear to auscultation bilaterally GI: abdomen is soft, non-tender, non-distended, normal bowel sounds, no hepatosplenomegaly MSK: extremities are warm, no edema.  Skin: warm, no rash Neuro:  no focal deficits Psych: appropriate affect   Diagnostic Studies     Assessment and Plan        Arnoldo Lenis, M.D., F.A.C.C.

## 2020-12-02 ENCOUNTER — Encounter (HOSPITAL_COMMUNITY): Payer: Self-pay | Admitting: *Deleted

## 2020-12-02 ENCOUNTER — Emergency Department (HOSPITAL_COMMUNITY)
Admission: EM | Admit: 2020-12-02 | Discharge: 2020-12-02 | Disposition: A | Discharge status: Home / Self Care | Payer: Medicare Other | Attending: Emergency Medicine | Admitting: Emergency Medicine

## 2020-12-02 ENCOUNTER — Emergency Department (HOSPITAL_COMMUNITY): Payer: Medicare Other

## 2020-12-02 DIAGNOSIS — B349 Viral infection, unspecified: Secondary | ICD-10-CM

## 2020-12-02 DIAGNOSIS — Z79899 Other long term (current) drug therapy: Secondary | ICD-10-CM | POA: Diagnosis not present

## 2020-12-02 DIAGNOSIS — E039 Hypothyroidism, unspecified: Secondary | ICD-10-CM | POA: Diagnosis not present

## 2020-12-02 DIAGNOSIS — Z20822 Contact with and (suspected) exposure to covid-19: Secondary | ICD-10-CM | POA: Insufficient documentation

## 2020-12-02 DIAGNOSIS — R079 Chest pain, unspecified: Secondary | ICD-10-CM | POA: Insufficient documentation

## 2020-12-02 DIAGNOSIS — J449 Chronic obstructive pulmonary disease, unspecified: Secondary | ICD-10-CM | POA: Diagnosis not present

## 2020-12-02 DIAGNOSIS — Z853 Personal history of malignant neoplasm of breast: Secondary | ICD-10-CM | POA: Diagnosis not present

## 2020-12-02 DIAGNOSIS — I10 Essential (primary) hypertension: Secondary | ICD-10-CM | POA: Insufficient documentation

## 2020-12-02 LAB — CBC
HCT: 42 % (ref 36.0–46.0)
Hemoglobin: 14.1 g/dL (ref 12.0–15.0)
MCH: 30.9 pg (ref 26.0–34.0)
MCHC: 33.6 g/dL (ref 30.0–36.0)
MCV: 92.1 fL (ref 80.0–100.0)
Platelets: 211 10*3/uL (ref 150–400)
RBC: 4.56 MIL/uL (ref 3.87–5.11)
RDW: 12.4 % (ref 11.5–15.5)
WBC: 13.2 10*3/uL — ABNORMAL HIGH (ref 4.0–10.5)
nRBC: 0 % (ref 0.0–0.2)

## 2020-12-02 LAB — BASIC METABOLIC PANEL
Anion gap: 9 (ref 5–15)
BUN: 15 mg/dL (ref 8–23)
CO2: 27 mmol/L (ref 22–32)
Calcium: 9.2 mg/dL (ref 8.9–10.3)
Chloride: 104 mmol/L (ref 98–111)
Creatinine, Ser: 0.76 mg/dL (ref 0.44–1.00)
GFR, Estimated: >60 mL/min (ref >60)
Glucose, Bld: 108 mg/dL — ABNORMAL HIGH (ref 70–99)
Potassium: 3.4 mmol/L — ABNORMAL LOW (ref 3.5–5.1)
Sodium: 140 mmol/L (ref 135–145)

## 2020-12-02 LAB — RESP PANEL BY RT-PCR (FLU A&B, COVID) ARPGX2
Influenza A by PCR: NEGATIVE
Influenza B by PCR: NEGATIVE
SARS Coronavirus 2 by RT PCR: NEGATIVE

## 2020-12-02 LAB — TROPONIN I (HIGH SENSITIVITY)
Troponin I (High Sensitivity): 5 ng/L (ref <18)
Troponin I (High Sensitivity): 6 ng/L (ref <18)

## 2020-12-02 NOTE — Discharge Instructions (Signed)
Return if any problems.

## 2020-12-02 NOTE — ED Triage Notes (Signed)
Chest pain onset last night °

## 2020-12-02 NOTE — ED Provider Notes (Signed)
[facility] EMERGENCY DEPARTMENT Provider Note   CSN: [id number] Arrival date & time: 12/02/20  1119     History Chief Complaint  Patient presents with  . Chest Pain    Gina Weber is a 75 y.o. female.  The history is provided by the patient. No language interpreter was used.  Chest Pain Pain location:  L chest and R chest Pain quality: aching   Pain radiates to:  Does not radiate Pain severity:  Mild Onset quality:  Gradual Duration:  1 day Timing:  Constant Progression:  Worsening Chronicity:  New Relieved by:  Nothing Worsened by:  Nothing Ineffective treatments:  None tried Associated symptoms: no abdominal pain   Risk factors: high cholesterol and hypertension   Pt complains of discomfort in her chest.  Pt reports she has had a cough and congestion    Pt reports she has been  Past Medical History:  Diagnosis Date  . Allergy   . Anxiety   . Arthritis   . Breast cancer [facility]) 1987 & 2006   radiation therapy  . COPD (chronic obstructive pulmonary disease) ([place])   . Depression   . Diverticulosis   . Fibromyalgia   . GERD (gastroesophageal reflux disease)   . Hyperlipidemia   . Hypertension   . Osteopenia 06/12/2013  . Palpitations   . Thyroid disease     Patient Active Problem List   Diagnosis Date Noted  . Osteopenia 06/12/2013  . Pure hypercholesterolemia 01/17/2013  . Chest pain 09/06/2012  . Anxiety 08/02/2012  . Hypothyroidism 04/01/2012  . RLS (restless legs syndrome) 12/29/2010  . DIVERTICULOSIS OF COLON 02/05/2010  . COPD 10/31/2009  . HYPERLIPIDEMIA 01/30/2008  . DEPRESSION 01/30/2008  . HYPERTENSION 11/27/2007  . ALLERGIC RHINITIS 11/27/2007  . GERD 11/27/2007  . BREAST CANCER, HX OF 11/27/2007    Past Surgical History:  Procedure Laterality Date  . ABDOMINAL HYSTERECTOMY    . APPENDECTOMY    . CATARACT EXTRACTION     left eye  . MASTECTOMY  1987   bilateral f/u reconstruction     OB History   No obstetric history on file.      Family History  Problem Relation Age of Onset  . Cancer Mother 34       esophageal   . Aneurysm Father        AAA  . Heart disease Father   . Depression Son        bipolar  . Depression Paternal Aunt   . Colon cancer Neg Hx     Social History   Tobacco Use  . Smoking status: Never Smoker  . Smokeless tobacco: Never Used  Substance Use Topics  . Alcohol use: No    Alcohol/week: 0.0 standard drinks  . Drug use: No    Home Medications Prior to Admission medications   Medication Sig Start Date End Date Taking? Authorizing Provider  ALPRAZolam Duanne Moron) 0.5 MG tablet Take 1 tablet (0.5 mg total) by mouth at bedtime as needed. For sleep 01/20/15   Kennyth Arnold, FNP  amitriptyline (ELAVIL) 10 MG tablet Take 2 tablets (20 mg total) by mouth at bedtime. 01/21/14   Kennyth Arnold, FNP  amLODipine (NORVASC) 5 MG tablet Take 1 tablet (5 mg total) by mouth daily. 02/14/15   Lendon Colonel, NP  Celedonio Miyamoto 62.5-25 MCG/INH AEPB  11/28/14   [provider]  ARNUITY ELLIPTA 100 MCG/ACT AEPB  12/26/14   [provider]  diclofenac (VOLTAREN) 50 MG EC  tablet Take 1 tablet (50 mg total) by mouth 2 (two) times daily. 04/22/17   Nat Christen, MD  DULoxetine (CYMBALTA) 30 MG capsule Take 2 capsules (60 mg total) by mouth daily. 08/01/14 08/01/15  Kennyth Arnold, FNP  esomeprazole (NEXIUM) 40 MG capsule Take 1 capsule (40 mg total) by mouth daily. 02/04/15   Dutch Quint B, FNP  levothyroxine (LEVOTHROID) 25 MCG tablet Take 1 tablet (25 mcg total) by mouth daily. 01/21/14   Kennyth Arnold, FNP  losartan (COZAAR) 100 MG tablet Take 1 tablet (100 mg total) by mouth daily. 02/04/15   Kennyth Arnold, FNP  meloxicam (MOBIC) 7.5 MG tablet Take 1 tablet (7.5 mg total) by mouth daily. 07/22/14   Kennyth Arnold, FNP  metoprolol tartrate (LOPRESSOR) 25 MG tablet Take 0.5 tablets (12.5 mg total) by mouth 2 (two) times daily. 08/25/15   Lendon Colonel, NP  montelukast (SINGULAIR) 10 MG  tablet Take 1 tablet by mouth daily. 01/21/14   [provider]  potassium chloride (K-DUR) 10 MEQ tablet Take 1 tablet (10 mEq total) by mouth 2 (two) times daily. 10/31/14   Dutch Quint B, FNP  potassium chloride SA (K-DUR,KLOR-CON) 20 MEQ tablet Take 1 tablet (20 mEq total) by mouth daily. 09/04/18   Elnora Morrison, MD  predniSONE (DELTASONE) 5 MG tablet Take 1 tablet (5 mg total) by mouth daily with breakfast. 2 tablets for 5 days; 1 tablet for 5 days Patient not taking: Reported on 12/02/2020 04/22/17   Nat Christen, MD    Allergies    Seroquel [quetiapine fumarate], Pravastatin sodium, Statins, Sulfa antibiotics, and Sulfamethoxazole  Review of Systems   Review of Systems  Cardiovascular: Positive for chest pain.  Gastrointestinal: Negative for abdominal pain.  All other systems reviewed and are negative.   Physical Exam Updated Vital Signs BP (!) 170/87   Pulse (!) 101   Temp (!) 100.4 F (38 C) (Oral)   Resp (!) 22   SpO2 97%   Physical Exam Vitals reviewed.  HENT:     Head: Normocephalic.  Cardiovascular:     Rate and Rhythm: Normal rate.     Heart sounds: Normal heart sounds.  Pulmonary:     Breath sounds: Normal breath sounds.  Abdominal:     General: Bowel sounds are normal.     Palpations: Abdomen is soft.  Musculoskeletal:        General: Normal range of motion.     Cervical back: Normal range of motion.  Skin:    General: Skin is warm.  Neurological:     General: No focal deficit present.     Mental Status: She is alert.  Psychiatric:        Mood and Affect: Mood normal.     ED Results / Procedures / Treatments   Labs (all labs ordered are listed, but only abnormal results are displayed) Labs Reviewed  BASIC METABOLIC PANEL - Abnormal; Notable for the following components:      Result Value   Potassium 3.4 (*)    Glucose, Bld 108 (*)    All other components within normal limits  CBC - Abnormal; Notable for the following components:   WBC  13.2 (*)    All other components within normal limits  RESP PANEL BY RT-PCR (FLU A&B, COVID) ARPGX2  TROPONIN I (HIGH SENSITIVITY)  TROPONIN I (HIGH SENSITIVITY)    EKG EKG Interpretation  Date/Time:  Tuesday December 02 2020 11:32:33 EDT Ventricular Rate:  117 PR  Interval:  128 QRS Duration: 70 QT Interval:  314 QTC Calculation: 438 R Axis:   87 Text Interpretation: Sinus tachycardia Left ventricular hypertrophy Nonspecific ST abnormality Confirmed by Lajean Saver [phone]) on 12/02/2020 12:11:04 PM   Radiology DG Chest 2 View  Result Date: 12/02/2020 CLINICAL DATA:  Chest pain EXAM: CHEST - 2 VIEW COMPARISON:  08/30/2019 FINDINGS: Normal heart size, mediastinal contours, and pulmonary vascularity. Atherosclerotic calcifications aorta. Emphysematous and minimal bronchitic changes consistent with COPD. Superior retraction of the hila with BILATERAL upper lobe volume loss and biapical scarring greater on RIGHT. No acute infiltrate, pleural effusion, or pneumothorax. Question nodular density in the RIGHT upper lobe on the previous exam no longer identified. Bones demineralized. IMPRESSION: COPD changes with BILATERAL upper lobe volume loss and biapical scarring, greater on RIGHT. No acute abnormalities. Aortic Atherosclerosis (ICD10-I70.0) and Emphysema (ICD10-J43.9). Electronically Signed   By: Lavonia Dana M.D.   On: 12/02/2020 11:56    Procedures Procedures   Medications Ordered in ED Medications - No data to display  ED Course  I have reviewed the triage vital signs and the nursing notes.  Pertinent labs & imaging results that were available during my care of the patient were reviewed by me and considered in my medical decision making (see chart for details).    MDM Rules/Calculators/A&P                          MDM:  tropnin negative x 2 chest xray no acute abnormality,  EKg is normal  covid is negative  Final Clinical Impression(s) / ED Diagnoses Final diagnoses:  None    Rx  / DC Orders ED Discharge Orders    None       Sidney Ace 12/02/20 1721    Lajean Saver, MD 12/05/20 1536

## 2021-08-11 NOTE — Progress Notes (Unsigned)
Cardiology Office Note   Date:  08/11/2021   ID:  Gina Weber, DOB May 26, 1946, MRN [id number]  PCP:  Patient, No Pcp Per (Inactive)  Cardiologist:   Jathniel Smeltzer [place], MD   No chief complaint on file.     History of Present Illness: Gina Weber is a 76 y.o. female who is seen at the request of ... for evaluation of SOB and cardiac murmur. She has a history of HTN, HLD, and thyroid disease. Was seen remotely by our group. Prior normal cardiac caths in 2008 and 2014. Last seen by Jory Sims NP in 2018.     Past Medical History:  Diagnosis Date   Allergy    Anxiety    Arthritis    Breast cancer [facility]) 1987 & 2006   radiation therapy   COPD (chronic obstructive pulmonary disease) ([place])    Depression    Diverticulosis    Fibromyalgia    GERD (gastroesophageal reflux disease)    Hyperlipidemia    Hypertension    Osteopenia 06/12/2013   Palpitations    Thyroid disease     Past Surgical History:  Procedure Laterality Date   ABDOMINAL HYSTERECTOMY     APPENDECTOMY     CATARACT EXTRACTION     left eye   MASTECTOMY  1987   bilateral f/u reconstruction     Current Outpatient Medications  Medication Sig Dispense Refill   ALPRAZolam (XANAX) 0.5 MG tablet Take 1 tablet (0.5 mg total) by mouth at bedtime as needed. For sleep 90 tablet 0   amitriptyline (ELAVIL) 10 MG tablet Take 2 tablets (20 mg total) by mouth at bedtime. 180 tablet 1   amLODipine (NORVASC) 5 MG tablet Take 1 tablet (5 mg total) by mouth daily. 30 tablet 6   ANORO ELLIPTA 62.5-25 MCG/INH AEPB      ARNUITY ELLIPTA 100 MCG/ACT AEPB      diclofenac (VOLTAREN) 50 MG EC tablet Take 1 tablet (50 mg total) by mouth 2 (two) times daily. 20 tablet 0   DULoxetine (CYMBALTA) 30 MG capsule Take 2 capsules (60 mg total) by mouth daily. 180 capsule 1   esomeprazole (NEXIUM) 40 MG capsule Take 1 capsule (40 mg total) by mouth daily. 90 capsule 0   levothyroxine (LEVOTHROID) 25 MCG tablet Take 1 tablet (25 mcg total) by  mouth daily. 90 tablet 1   losartan (COZAAR) 100 MG tablet Take 1 tablet (100 mg total) by mouth daily. 90 tablet 0   meloxicam (MOBIC) 7.5 MG tablet Take 1 tablet (7.5 mg total) by mouth daily. 90 tablet 1   metoprolol tartrate (LOPRESSOR) 25 MG tablet Take 0.5 tablets (12.5 mg total) by mouth 2 (two) times daily. 30 tablet 6   montelukast (SINGULAIR) 10 MG tablet Take 1 tablet by mouth daily.     potassium chloride (K-DUR) 10 MEQ tablet Take 1 tablet (10 mEq total) by mouth 2 (two) times daily. 60 tablet 2   potassium chloride SA (K-DUR,KLOR-CON) 20 MEQ tablet Take 1 tablet (20 mEq total) by mouth daily. 3 tablet 0   predniSONE (DELTASONE) 5 MG tablet Take 1 tablet (5 mg total) by mouth daily with breakfast. 2 tablets for 5 days; 1 tablet for 5 days (Patient not taking: Reported on 12/02/2020) 15 tablet 0   No current facility-administered medications for this visit.    Allergies:   Seroquel [quetiapine fumarate], Pravastatin sodium, Statins, Sulfa antibiotics, and Sulfamethoxazole    Social History:  The patient  reports that she has never  smoked. She has never used smokeless tobacco. She reports that she does not drink alcohol and does not use drugs.   Family History:  The patient's ***family history includes Aneurysm in her father; Cancer (age of onset: 42) in her mother; Depression in her paternal aunt and son; Heart disease in her father.    ROS:  Please see the history of present illness.   Otherwise, review of systems are positive for {NONE DEFAULTED:18576}.   All other systems are reviewed and negative.    PHYSICAL EXAM: VS:  There were no vitals taken for this visit. , BMI There is no height or weight on file to calculate BMI. GEN: Well nourished, well developed, in no acute distress HEENT: normal Neck: no JVD, carotid bruits, or masses Cardiac: ***RRR; no murmurs, rubs, or gallops,no edema  Respiratory:  clear to auscultation bilaterally, normal work of breathing GI: soft,  nontender, nondistended, + BS MS: no deformity or atrophy Skin: warm and dry, no rash Neuro:  Strength and sensation are intact Psych: euthymic mood, full affect   EKG:  EKG {ACTION; IS/IS HER:[id number]} ordered today. The ekg ordered today demonstrates ***   Recent Labs: 12/02/2020: BUN 15; Creatinine, Ser 0.76; Hemoglobin 14.1; Platelets 211; Potassium 3.4; Sodium 140    Lipid Panel    Component Value Date/Time   CHOL 287 (H) 07/08/2014 1106   TRIG 181.0 (H) 07/08/2014 1106   HDL 68.00 07/08/2014 1106   CHOLHDL 4 07/08/2014 1106   VLDL 36.2 07/08/2014 1106   LDLCALC 183 (H) 07/08/2014 1106   LDLDIRECT 181.8 03/02/2013 1123      Wt Readings from Last 3 Encounters:  08/30/19 130 lb (59 kg)  09/04/18 135 lb (61.2 kg)  04/22/17 160 lb (72.6 kg)      Other studies Reviewed: Additional studies/ records that were reviewed today include:   Event monitor 01/17/15: Study Highlights  Sinus rhythm and sinus tachycardia with isolated PAC's and PVC's. Paroxysmal SVT, sustained run seen. Correlated with symptoms. One episode of atrial tachyarrhythmia, possibly rapid atrial fibrillation seen. Correlated with symptoms.  Cardiac Catheterization Operative Report   Gina Weber [id number] 3/20/201411:58 AM CAMPBELL, Hillard Danker, FNP   Procedure Performed:  Left Heart Catheterization Selective Coronary Angiography Left ventricular angiogram   Operator: Lauree Chandler, MD   Indication:   76 year old white female, nonsmoker with history of  hypertension, hypothyroidism, anxiety, GERD, osteoarthritis, and history of breast cancer who continues to have episodes of sub-sternal chest pressure with exercise. Diagnostic cath arranged by Dr. Johnsie Cancel to exclude CAD.                                      Procedure Details: The risks, benefits, complications, treatment options, and expected outcomes were discussed with the patient. The patient and/or family concurred with the proposed  plan, giving informed consent. The patient was brought to the cath lab after IV hydration was begun and oral premedication was given. The patient was further sedated with Versed and Fentanyl. The right groin was prepped and draped in the usual manner. Using the modified Seldinger access technique, a 4 French sheath was placed in the right femoral artery. Standard diagnostic catheters were used to perform selective coronary angiography. A pigtail catheter was used to perform a left ventricular angiogram.   There were no immediate complications. The patient was taken to the recovery area in stable condition.    Hemodynamic Findings: Central  aortic pressure: 135/71 Left ventricular pressure: 150/14/15   Angiographic Findings:   Left main: No obstructive disease. Short segment.    Left Anterior Descending Artery: Large caliber vessel that courses to the apex. Moderate caliber diagonal branch. No obstructive disease noted.    Circumflex Artery: Moderate caliber vessel with three obtuse marginal branches. No obstructive disease noted.    Right Coronary Artery: Small non-dominant vessel with no obstructive disease noted.    Left Ventricular Angiogram: LVEF=65%    Impression: 1. No angiographic evidence of CAD 2. Normal LV function 3. Non-cardiac chest pain   Recommendations: No further ischemic workup.        Complications:  None. The patient tolerated the procedure well.                         Electronically signed by Burnell Blanks, MD at 09/14/2012 12:05 PM   ASSESSMENT AND PLAN:  1.  ***   Current medicines are reviewed at length with the patient today.  The patient {ACTIONS; HAS/DOES NOT HAVE:19233} concerns regarding medicines.  The following changes have been made:  {PLAN; NO CHANGE:13088:s}  Labs/ tests ordered today include: *** No orders of the defined types were placed in this encounter.        Disposition:   FU with *** in {gen number 1-60:737106}  {Days to years:10300}  Signed, [place] Lemaster Martinique, MD  08/11/2021 8:09 AM    [facility] [address] [place], [place], [address] Phone [phone], Fax [phone]

## 2021-08-13 ENCOUNTER — Ambulatory Visit: Payer: Self-pay | Admitting: Cardiology

## 2021-12-06 ENCOUNTER — Other Ambulatory Visit: Payer: Self-pay

## 2021-12-06 ENCOUNTER — Observation Stay (HOSPITAL_COMMUNITY)
Admission: EM | Admit: 2021-12-06 | Discharge: 2021-12-06 | Disposition: A | Discharge status: Home / Self Care | Payer: Medicare Other | Attending: Emergency Medicine | Admitting: Emergency Medicine

## 2021-12-06 ENCOUNTER — Encounter (HOSPITAL_COMMUNITY): Payer: Self-pay | Admitting: *Deleted

## 2021-12-06 ENCOUNTER — Emergency Department (HOSPITAL_COMMUNITY): Payer: Medicare Other

## 2021-12-06 DIAGNOSIS — R0789 Other chest pain: Secondary | ICD-10-CM | POA: Diagnosis present

## 2021-12-06 DIAGNOSIS — E876 Hypokalemia: Secondary | ICD-10-CM | POA: Diagnosis not present

## 2021-12-06 DIAGNOSIS — R079 Chest pain, unspecified: Secondary | ICD-10-CM | POA: Diagnosis present

## 2021-12-06 DIAGNOSIS — R0602 Shortness of breath: Secondary | ICD-10-CM | POA: Diagnosis not present

## 2021-12-06 DIAGNOSIS — Z79899 Other long term (current) drug therapy: Secondary | ICD-10-CM | POA: Diagnosis not present

## 2021-12-06 DIAGNOSIS — Z853 Personal history of malignant neoplasm of breast: Secondary | ICD-10-CM | POA: Insufficient documentation

## 2021-12-06 DIAGNOSIS — I10 Essential (primary) hypertension: Secondary | ICD-10-CM | POA: Diagnosis not present

## 2021-12-06 DIAGNOSIS — J449 Chronic obstructive pulmonary disease, unspecified: Secondary | ICD-10-CM | POA: Insufficient documentation

## 2021-12-06 DIAGNOSIS — R531 Weakness: Secondary | ICD-10-CM | POA: Diagnosis not present

## 2021-12-06 DIAGNOSIS — R627 Adult failure to thrive: Secondary | ICD-10-CM | POA: Diagnosis not present

## 2021-12-06 DIAGNOSIS — E639 Nutritional deficiency, unspecified: Secondary | ICD-10-CM

## 2021-12-06 LAB — CBC WITH DIFFERENTIAL/PLATELET
Abs Immature Granulocytes: 0.02 10*3/uL (ref 0.00–0.07)
Basophils Absolute: 0 10*3/uL (ref 0.0–0.1)
Basophils Relative: 1 %
Eosinophils Absolute: 0.1 10*3/uL (ref 0.0–0.5)
Eosinophils Relative: 1 %
HCT: 42.2 % (ref 36.0–46.0)
Hemoglobin: 14.6 g/dL (ref 12.0–15.0)
Immature Granulocytes: 0 %
Lymphocytes Relative: 15 %
Lymphs Abs: 1 10*3/uL (ref 0.7–4.0)
MCH: 28.9 pg (ref 26.0–34.0)
MCHC: 34.6 g/dL (ref 30.0–36.0)
MCV: 83.6 fL (ref 80.0–100.0)
Monocytes Absolute: 0.6 10*3/uL (ref 0.1–1.0)
Monocytes Relative: 9 %
Neutro Abs: 4.9 10*3/uL (ref 1.7–7.7)
Neutrophils Relative %: 74 %
Platelets: 211 10*3/uL (ref 150–400)
RBC: 5.05 MIL/uL (ref 3.87–5.11)
RDW: 13.4 % (ref 11.5–15.5)
WBC: 6.6 10*3/uL (ref 4.0–10.5)
nRBC: 0 % (ref 0.0–0.2)

## 2021-12-06 LAB — BASIC METABOLIC PANEL
Anion gap: 4 — ABNORMAL LOW (ref 5–15)
BUN: 8 mg/dL (ref 8–23)
CO2: 25 mmol/L (ref 22–32)
Calcium: 8.7 mg/dL — ABNORMAL LOW (ref 8.9–10.3)
Chloride: 112 mmol/L — ABNORMAL HIGH (ref 98–111)
Creatinine, Ser: 0.68 mg/dL (ref 0.44–1.00)
GFR, Estimated: >60 mL/min (ref >60)
Glucose, Bld: 95 mg/dL (ref 70–99)
Potassium: 4.2 mmol/L (ref 3.5–5.1)
Sodium: 141 mmol/L (ref 135–145)

## 2021-12-06 LAB — COMPREHENSIVE METABOLIC PANEL
ALT: 15 U/L (ref 0–44)
AST: 16 U/L (ref 15–41)
Albumin: 3.4 g/dL — ABNORMAL LOW (ref 3.5–5.0)
Alkaline Phosphatase: 89 U/L (ref 38–126)
Anion gap: 6 (ref 5–15)
BUN: 11 mg/dL (ref 8–23)
CO2: 26 mmol/L (ref 22–32)
Calcium: 8.8 mg/dL — ABNORMAL LOW (ref 8.9–10.3)
Chloride: 110 mmol/L (ref 98–111)
Creatinine, Ser: 0.72 mg/dL (ref 0.44–1.00)
GFR, Estimated: >60 mL/min (ref >60)
Glucose, Bld: 96 mg/dL (ref 70–99)
Potassium: 2.6 mmol/L — CL (ref 3.5–5.1)
Sodium: 142 mmol/L (ref 135–145)
Total Bilirubin: 1.1 mg/dL (ref 0.3–1.2)
Total Protein: 6.4 g/dL — ABNORMAL LOW (ref 6.5–8.1)

## 2021-12-06 LAB — BRAIN NATRIURETIC PEPTIDE: B Natriuretic Peptide: 34 pg/mL (ref 0.0–100.0)

## 2021-12-06 LAB — TROPONIN I (HIGH SENSITIVITY)
Troponin I (High Sensitivity): 5 ng/L (ref <18)
Troponin I (High Sensitivity): 5 ng/L (ref <18)

## 2021-12-06 LAB — MAGNESIUM: Magnesium: 2.1 mg/dL (ref 1.7–2.4)

## 2021-12-06 MED ORDER — SODIUM CHLORIDE 0.9 % IV SOLN: Freq: Once | INTRAVENOUS | Status: AC

## 2021-12-06 MED ORDER — POTASSIUM CHLORIDE CRYS ER 20 MEQ PO TBCR
40.0000 meq | EXTENDED_RELEASE_TABLET | Freq: Once | ORAL | Status: AC
  Administered 2021-12-06: 40 meq via ORAL
  Filled 2021-12-06: qty 2

## 2021-12-06 MED ORDER — SODIUM CHLORIDE 0.9 % IV BOLUS
1000.0000 mL | Freq: Once | INTRAVENOUS | Status: AC
  Administered 2021-12-06: 1000 mL via INTRAVENOUS

## 2021-12-06 MED ORDER — POTASSIUM CHLORIDE 10 MEQ/100ML IV SOLN
10.0000 meq | INTRAVENOUS | Status: AC
  Administered 2021-12-06 (×4): 10 meq via INTRAVENOUS
  Filled 2021-12-06 (×4): qty 100

## 2021-12-06 MED ORDER — POTASSIUM CHLORIDE CRYS ER 20 MEQ PO TBCR
40.0000 meq | EXTENDED_RELEASE_TABLET | Freq: Once | ORAL | Status: AC
Start: 2021-12-06 — End: 2021-12-06
  Administered 2021-12-06: 40 meq via ORAL
  Filled 2021-12-06: qty 2

## 2021-12-06 MED ORDER — POTASSIUM CHLORIDE CRYS ER 20 MEQ PO TBCR: 20.0000 meq | EXTENDED_RELEASE_TABLET | Freq: Two times a day (BID) | ORAL | 0 refills | Status: AC

## 2021-12-06 NOTE — ED Notes (Signed)
Delay in transport/ report to floor. Pt to have BMP rechecked at 1900. Will re-assess disposition with new results.

## 2021-12-06 NOTE — Consult Note (Signed)
Initial Consultation Note   Patient: Gina Weber JXB:[id number] DOB: 21-Nov-1945 PCP: Patient, No Pcp Per (Inactive) DOA: 12/06/2021 DOS: the patient was seen and examined on 12/06/2021 Primary service: Bethena Roys, MD  Referring Provider: Fransico Meadow Reason for consult: Hypokalemia  Assessment and Plan: Hypokalemia K- 2.6, chronic issue.  This time likely 2/2 poor oral intake. She denies Vomiting, no loose stools. She reports chronic poor appetite that's unchanged.  Magnesium normal at 2.1.  - IV KCL 40 and Oral KCL 40 given in ED - Would recommend another 40 meq to be given in ED prior to Discharge, and a prescription for another 40 tomorrow.  -Patient needs to establish care with an outpatient provider and  have blood work rechecked -Patient tells me she wants to follow-up as outpatient but has no provider, does not know where to go.  I talked to [facility] at [facility], list of outpatient providers has been included in the after visit summary.  -At this time patient does not meet criteria for hospitalization, her hypokalemia is nutritional, she has no GI losses, and not on medications that will cause hypokalemia, with IV and oral supplementation in the ED she should be able to go home, follow-up with an outpatient provider.  ED will determine disposition.   Chest pain Atypical chest pains.  Likely musculoskeletal.  Troponins 5 X 2 and EKG unremarkable.  Tells me last episode of chest pain was last night lasted a few seconds.  No chest pains today.  Denies associated cardiac or respiratory symptoms.  Never smoked cigarettes, but was exposed to secondhand smoke.  She also has psychologic stressors with depression and anxiety, she stays in a Motel currently, with her son. -Recommend establishing and following up with outpatient provider   HPI: Gina Weber is a 76 y.o. female with past medical history of COPD, depression, hypertension, restless leg syndrome.  Patient presented to the ED with  complaints of weakness and reduced appetite.  She denies vomiting, no loose stools.  She has chronic poor oral intake, she feels depressed and anxious as she lives in a motel with her son, and is worried about her money running out. She reports chest pain in the middle/and slightly to the left of her sternum, that is sharp and pressure-like and lasts a few seconds, mostly present when she sits and leans forward to get up.  Chest pain is nonradiating.  Last episode of chest pain was last night.  She has not had any chest pains today.  She reports chest pain may be 1ce or less episodes of chest pain in a week.  She denies chest pains with activity.   She has chronic breathing problems 2/2 COPD from secondhand smoke, otherwise her breathing is unchanged.  No dizziness.  She reports she has had a stress test in the past.  Otherwise she has not had any cardiac history.  She never smoked cigarettes.   Review of Systems: As mentioned in the history of present illness. All other systems reviewed and are negative. Past Medical History:  Diagnosis Date   Allergy    Anxiety    Arthritis    Breast cancer [facility]) 1987 & 2006   radiation therapy   COPD (chronic obstructive pulmonary disease) ([place])    Depression    Diverticulosis    Fibromyalgia    GERD (gastroesophageal reflux disease)    Hyperlipidemia    Hypertension    Osteopenia 06/12/2013   Palpitations    Thyroid disease  Past Surgical History:  Procedure Laterality Date   ABDOMINAL HYSTERECTOMY     APPENDECTOMY     CATARACT EXTRACTION     left eye   MASTECTOMY  1987   bilateral f/u reconstruction   Social History:  reports that she has never smoked. She has never used smokeless tobacco. She reports that she does not drink alcohol and does not use drugs.  Allergies  Allergen Reactions   Seroquel [Quetiapine Fumarate] Anaphylaxis   Pravastatin Sodium Other (See Comments)    liver enzyme abnormality---per pt's report   Statins Other (See  Comments)    leg pain, elevates liver enzymes   Sulfa Antibiotics Other (See Comments)    Unknown   Sulfamethoxazole Hives    Family History  Problem Relation Age of Onset   Cancer Mother 22       esophageal    Aneurysm Father        AAA   Heart disease Father    Depression Son        bipolar   Depression Paternal Aunt    Colon cancer Neg Hx     Prior to Admission medications   Medication Sig Start Date End Date Taking? Authorizing Provider  ALPRAZolam Duanne Moron) 0.5 MG tablet Take 1 tablet (0.5 mg total) by mouth at bedtime as needed. For sleep Patient not taking: Reported on 12/06/2021 01/20/15   Kennyth Arnold, FNP  amitriptyline (ELAVIL) 10 MG tablet Take 2 tablets (20 mg total) by mouth at bedtime. Patient not taking: Reported on 12/06/2021 01/21/14   Dutch Quint B, FNP  amLODipine (NORVASC) 5 MG tablet Take 1 tablet (5 mg total) by mouth daily. Patient not taking: Reported on 12/06/2021 02/14/15   Lendon Colonel, NP  DULoxetine (CYMBALTA) 30 MG capsule Take 2 capsules (60 mg total) by mouth daily. Patient not taking: Reported on 12/06/2021 08/01/14 08/01/15  Kennyth Arnold, FNP  esomeprazole (NEXIUM) 40 MG capsule Take 1 capsule (40 mg total) by mouth daily. Patient not taking: Reported on 12/06/2021 02/04/15   Dutch Quint B, FNP  levothyroxine (LEVOTHROID) 25 MCG tablet Take 1 tablet (25 mcg total) by mouth daily. Patient not taking: Reported on 12/06/2021 01/21/14   Kennyth Arnold, FNP  losartan (COZAAR) 100 MG tablet Take 1 tablet (100 mg total) by mouth daily. Patient not taking: Reported on 12/06/2021 02/04/15   Dutch Quint B, FNP  meloxicam (MOBIC) 7.5 MG tablet Take 1 tablet (7.5 mg total) by mouth daily. Patient not taking: Reported on 12/06/2021 07/22/14   Dutch Quint B, FNP  metoprolol tartrate (LOPRESSOR) 25 MG tablet Take 0.5 tablets (12.5 mg total) by mouth 2 (two) times daily. Patient not taking: Reported on 12/06/2021 08/25/15   Lendon Colonel, NP  montelukast  (SINGULAIR) 10 MG tablet Take 1 tablet by mouth daily. Patient not taking: Reported on 12/06/2021 01/21/14   [provider]  potassium chloride (K-DUR) 10 MEQ tablet Take 1 tablet (10 mEq total) by mouth 2 (two) times daily. Patient not taking: Reported on 12/06/2021 10/31/14   Dutch Quint B, FNP  rosuvastatin (CRESTOR) 10 MG tablet Take 10 mg by mouth at bedtime. Patient not taking: Reported on 12/06/2021 11/25/21   [provider]  Vitamin D, Ergocalciferol, (DRISDOL) 1.25 MG (50000 UNIT) CAPS capsule Take 50,000 Units by mouth once a week. Patient not taking: Reported on 12/06/2021 10/26/21   [provider]    Physical Exam: Vitals:   12/06/21 1230 12/06/21 1300 12/06/21 1400 12/06/21 1430  BP: (!) 143/94 (!) 150/86 (!) 150/87 (!) 144/77  Pulse: 92 88 89 89  Resp: 13 (!) '24 16 19  '$ Temp:      TempSrc:      SpO2: 96% 98% 95% 97%  Weight:      Height:        Constitutional: NAD, calm, comfortable Eyes: PERRL, lids and conjunctivae normal ENMT: Mucous membranes are moist.   Neck: normal, supple, no masses, no thyromegaly Respiratory: clear to auscultation bilaterally, no wheezing, no crackles. Normal respiratory effort. No accessory muscle use.  Cardiovascular: Regular rate and rhythm, no murmurs / rubs / gallops. No extremity edema. 2+ pedal pulses.   Abdomen: no tenderness, no masses palpated. No hepatosplenomegaly. Bowel sounds positive.  Musculoskeletal: no clubbing / cyanosis. No joint deformity upper and lower extremities. Good ROM, no contractures. Normal muscle tone.  Skin: no rashes, lesions, ulcers. No induration Neurologic: No apparent cranial nerve abnormality, moving extremities spontaneously. Psychiatric: Normal judgment and insight. Alert and oriented x 3.  Slightly anxious  Data Reviewed:  Potassium 2.6. EKG sinus tachycardia rate 106, QTc 475.  No significant ST or T wave changes from prior. Troponin 5 X 2 Magnesium 2.1. Chest x-ray with  chronic interstitial changes.  Family Communication: None at bedside Primary team communication:  Thank you very much for involving [place] in the care of your patient.  Author: Bethena Roys, MD 12/06/2021 4:57 PM  For on call review [email or web address].

## 2021-12-06 NOTE — Assessment & Plan Note (Addendum)
K- 2.6, chronic issue.  This time likely 2/2 poor oral intake. She denies Vomiting, no loose stools. She reports chronic poor appetite that's unchanged.  Magnesium normal at 2.1.  - IV KCL 40 and Oral KCL 40 given in ED - Would recommend another 40 meq to be given in ED prior to Discharge, and a prescription for another 40 tomorrow.  -Patient needs to establish care with an outpatient provider and  have blood work rechecked -Patient tells me she wants to follow-up as outpatient but has no provider, does not know where to go.  I talked to [facility] at [facility], list of outpatient providers has been included in the after visit summary.  -At this time patient does not meet criteria for hospitalization, her hypokalemia is nutritional, she has no GI losses, and not on medications that will cause hypokalemia, with IV and oral supplementation in the ED she should be able to go home, follow-up with an outpatient provider.  ED will determine disposition.

## 2021-12-06 NOTE — ED Provider Notes (Signed)
[facility] EMERGENCY DEPARTMENT Provider Note   CSN: [id number] Arrival date & time: 12/06/21  1100     History  Chief Complaint  Patient presents with   Chest Pain    Gina Weber is a 76 y.o. female.  Patient complains of feeling weak and nauseated.  Patient reports she has had a decreased appetite.  Patient complains of pain in her chest for the past 2 days.  Patient is here with her son he states that she is not eating or drinking well.  He reports patient has been depressed since her husband died a year ago.  Patient reports she is currently living in a motel because she and her son cannot find a home to rent and this causes her lots of anxiety.  She denies any fever or chills.  She denies cough or congestion patient denies any burning with urination.  Patient is not currently taking any medications.  He is not currently seeing her physician she has a past medical history of hypertension, hypothyroidism ,hypercholesterolemia, depression and anxiety.  The history is provided by the patient. No language interpreter was used.  Chest Pain Pain location:  L lateral chest Pain quality: not aching   Progression:  Worsening Relieved by:  Nothing Worsened by:  Nothing      Home Medications Prior to Admission medications   Medication Sig Start Date End Date Taking? Authorizing Provider  ALPRAZolam Duanne Moron) 0.5 MG tablet Take 1 tablet (0.5 mg total) by mouth at bedtime as needed. For sleep 01/20/15   Kennyth Arnold, FNP  amitriptyline (ELAVIL) 10 MG tablet Take 2 tablets (20 mg total) by mouth at bedtime. 01/21/14   Kennyth Arnold, FNP  amLODipine (NORVASC) 5 MG tablet Take 1 tablet (5 mg total) by mouth daily. 02/14/15   Lendon Colonel, NP  Celedonio Miyamoto 62.5-25 MCG/INH AEPB  11/28/14   [provider]  ARNUITY ELLIPTA 100 MCG/ACT AEPB  12/26/14   [provider]  diclofenac (VOLTAREN) 50 MG EC tablet Take 1 tablet (50 mg total) by mouth 2 (two) times daily. 04/22/17    Nat Christen, MD  DULoxetine (CYMBALTA) 30 MG capsule Take 2 capsules (60 mg total) by mouth daily. 08/01/14 08/01/15  Kennyth Arnold, FNP  esomeprazole (NEXIUM) 40 MG capsule Take 1 capsule (40 mg total) by mouth daily. 02/04/15   Dutch Quint B, FNP  levothyroxine (LEVOTHROID) 25 MCG tablet Take 1 tablet (25 mcg total) by mouth daily. 01/21/14   Kennyth Arnold, FNP  losartan (COZAAR) 100 MG tablet Take 1 tablet (100 mg total) by mouth daily. 02/04/15   Kennyth Arnold, FNP  meloxicam (MOBIC) 7.5 MG tablet Take 1 tablet (7.5 mg total) by mouth daily. 07/22/14   Kennyth Arnold, FNP  metoprolol tartrate (LOPRESSOR) 25 MG tablet Take 0.5 tablets (12.5 mg total) by mouth 2 (two) times daily. 08/25/15   Lendon Colonel, NP  montelukast (SINGULAIR) 10 MG tablet Take 1 tablet by mouth daily. 01/21/14   [provider]  potassium chloride (K-DUR) 10 MEQ tablet Take 1 tablet (10 mEq total) by mouth 2 (two) times daily. 10/31/14   Dutch Quint B, FNP  potassium chloride SA (K-DUR,KLOR-CON) 20 MEQ tablet Take 1 tablet (20 mEq total) by mouth daily. 09/04/18   Elnora Morrison, MD  predniSONE (DELTASONE) 5 MG tablet Take 1 tablet (5 mg total) by mouth daily with breakfast. 2 tablets for 5 days; 1 tablet for 5 days Patient not taking: Reported on  12/02/2020 04/22/17   Nat Christen, MD      Allergies    Seroquel [quetiapine fumarate], Pravastatin sodium, Statins, Sulfa antibiotics, and Sulfamethoxazole    Review of Systems   Review of Systems  Cardiovascular:  Positive for chest pain.  All other systems reviewed and are negative.   Physical Exam Updated Vital Signs BP (!) 150/87   Pulse 89   Temp 98.2 F (36.8 C) (Oral)   Resp 16   Ht '5\' 5"'$  (1.651 m)   Wt 49.9 kg   SpO2 95%   BMI 18.30 kg/m  Physical Exam Vitals and nursing note reviewed.  Constitutional:      Appearance: She is ill-appearing.     Comments: Thin frail appearing  HENT:     Head: Normocephalic.  Cardiovascular:     Rate and  Rhythm: Normal rate.     Heart sounds: Normal heart sounds.  Pulmonary:     Effort: Pulmonary effort is normal.     Breath sounds: Normal breath sounds.  Abdominal:     General: There is no distension.     Palpations: Abdomen is soft.  Musculoskeletal:        General: Normal range of motion.     Cervical back: Normal range of motion.  Skin:    General: Skin is warm.  Neurological:     General: No focal deficit present.     Mental Status: She is alert and oriented to person, place, and time.     ED Results / Procedures / Treatments   Labs (all labs ordered are listed, but only abnormal results are displayed) Labs Reviewed  COMPREHENSIVE METABOLIC PANEL - Abnormal; Notable for the following components:      Result Value   Potassium 2.6 (*)    Calcium 8.8 (*)    Total Protein 6.4 (*)    Albumin 3.4 (*)    All other components within normal limits  CBC WITH DIFFERENTIAL/PLATELET  BRAIN NATRIURETIC PEPTIDE  MAGNESIUM  TROPONIN I (HIGH SENSITIVITY)  TROPONIN I (HIGH SENSITIVITY)    EKG None  Radiology DG Chest Port 1 View  Result Date: 12/06/2021 CLINICAL DATA:  Chest pain and shortness of breath. EXAM: PORTABLE CHEST 1 VIEW COMPARISON:  12/02/2020 FINDINGS: The lungs are clear without focal pneumonia, edema, pneumothorax or pleural effusion. Stable scarring right lung apex. The cardiopericardial silhouette is within normal limits for size. The visualized bony structures of the thorax are unremarkable. Telemetry leads overlie the chest. IMPRESSION: Hyperexpansion with chronic interstitial changes and right apical scarring. No acute cardiopulmonary findings. Electronically Signed   By: Misty Stanley M.D.   On: 12/06/2021 12:49    Procedures Procedures    Medications Ordered in ED Medications  potassium chloride 10 mEq in 100 mL IVPB (has no administration in time range)  potassium chloride SA (KLOR-CON M) CR tablet 40 mEq (has no administration in time range)  sodium  chloride 0.9 % bolus 1,000 mL (0 mLs Intravenous Stopped 12/06/21 1426)  0.9 %  sodium chloride infusion ( Intravenous New Bag/Given 12/06/21 1220)    ED Course/ Medical Decision Making/ A&P                           Medical Decision Making Patient complains of increased weakness and increased nausea and anorexia  Amount and/or Complexity of Data Reviewed Independent Historian: caregiver    Details: Patient is here with her son he is concerned that patient is dehydrated  as she is not eating or drinking well Labs: ordered. Decision-making details documented in ED Course.    Details: Labs are ordered reviewed and interpreted patient's potassium is 2.6 Radiology: ordered and independent interpretation performed. Decision-making details documented in ED Course.    Details: Hest x-ray shows chronic interstitial changes hyperexpansion  Risk Prescription drug management. Risk Details: IV potassium ordered oral potassium ordered I will discuss patient with hospitalist for admission  Chest x-ray shows MDM differential diagnosis for weakness: Anemia infection possible urinary tract infection,  angina        Final Clinical Impression(s) / ED Diagnoses Final diagnoses:  Hypokalemia  Weakness  Failure to thrive in adult    Rx / DC Orders ED Discharge Orders     None         Fransico Meadow, PA-C 12/06/21 1438    [place], Alvin Critchley, DO 12/07/21 [phone]

## 2021-12-06 NOTE — ED Triage Notes (Signed)
Pt c/o mid chest pain that started a few days ago; pt denies any radiating pain but describes it as a pressure; pt states she has not been tanking her medications for the last 2 months because she has a hard time remembering; pt lives with her son in a hotel;   Pt states she feels sob, nauseous and has some weakness and dizziness with the pain

## 2021-12-06 NOTE — ED Notes (Signed)
Pt crawled over side rail to void on floor, verbalizes cannot use purewick, purewick 1/2 full, bed dry, floor cleaned of urine, cannister emptied. Encouraged to use CB. CBIR. Returned to stretcher and monitor. Pt anxious and upset. Reassured.

## 2021-12-06 NOTE — ED Provider Notes (Signed)
Accepted handoff at shift change from [facility]. Please see prior provider note for more detail.   Briefly: Patient is 76 y.o.   DDX: concern for hypokalemia, failure to thrive.  Hospitalist evaluated patient and would not like to discharge at this time.  She has gotten significant potassium repleted in the emergency department.  Repeat BMP with potassium improved to 4.1.  Patient discharged in stable condition at this time with return precautions given.    Dorien Chihuahua 12/06/21 2030    Luna Fuse, MD 12/14/21 [phone]

## 2021-12-06 NOTE — Discharge Instructions (Addendum)
Primary Health Care  For private/commercial insurance please contact the phone number on the back of your insurance card or contact your insurance provider for covered primary health care providers in your area.  For Medicaid covered primary care please consult the physician listed on your Medicaid card. If there is no physician listed, or the physician is no longer in practice, please contact the Department of Social Services in the county your [place] card is issued from for support.    [facility]  [address] [place],  [place]  [address] Main: [phone] Fax: [phone] Mon-Fri: 8:30AM-5:30PM  [facility] is a patient-centered practice. We provide integrated care in an effort to achieve positive patient experiences and improve patient health care outcomes. Our team is here to support patients in their wellness journeys as well as to provide chronic disease management, lab testing, health education and much more.  [facility]  [address] [place], [place] [address] Phone: [phone] Mon-Fri: 8:30AM-5:30PM   We provide services to children, adolescents, adults and families living with physical, behavioral and mental health challenges.  Our multidisciplinary team delivers evidence based practices to ensure positive outcomes for our patients. We provide an array of Primary Care, Behavioral Health, and Psychiatric services to children, adolescents, adults and families.       [facility]  [address]  [place], [place] [address] Main: [phone]  Mon-Fri: 8AM-8PM,  Saturday and Sunday 8AM to [place] in the [place] off [employer], we provide primary care services for families in [place]. [facility]  [address]   [place], [place] [address] Phone: [phone] Mon-Fri: 8:30AM-5:30PM   [facility] provides primary care services to adults and children 12 years and older in [place].    [facility]  Family Medicine at [place], [place] Phone: [phone] Mon-Fri: 8:30am - 5:30pm  Pediatrics at [place] [place], [place] Phone: [phone] Mon-Fri: 8:30am - 5:30pm Saturdays (October - March): [place] at [facility]. [place], [place] Phone: [phone] M, W & F: 8:00am - 5:00pm [facility]   [facility] is your medical home. A medical home is a doctor's office where the staff knows you, your children, and your family. Here we know your health history, here you should feel comforted and cared for, knowing you're receiving top-notch care from professionals devoted to your well-being. [facility] offers integrated behavioral health care to all patients. In addition our advanced care teams are compromised of a Dietician, (LCSW) Licensed Holiday representative and a Engineer, maintenance.  Our aim is to address mental health and the social drivers by offering education, support and classes for patients through all walks of life.    [facility] [place],  [place], [place] [address] Phone: [phone]   [facility] [place] provides basic healthcare for adults of [place] who cannot afford health insurance and do not qualify for Medicare or Medicaid.  [facility] [address] [place],  [place], [place] [address] Phone: [phone]   We are a non-profit organization that functions on grants, fundraisers  and donations. We serve uninsured [place] residents age 68 and over that fall at or below 150% of the  federal poverty guidelines.   [facility] [place],  [place], [place] [address] Phone:  [phone]  It is the mission of the [facility] to recognize the right of low income, uninsured citizens of [place] to have access to health care that compassionately meets their basic medical and pharmacy needs. [facility] [place]) [address],  [place], [place] [address] Phone: [phone]  Provide health care services and medicines to uninsured patients who don't qualify for federal or private insurance and have family incomes below 200% of the Federal Poverty Level.

## 2021-12-06 NOTE — Assessment & Plan Note (Addendum)
Atypical chest pains.  Likely musculoskeletal.  Troponins 5 X 2 and EKG unremarkable.  Tells me last episode of chest pain was last night lasted a few seconds.  No chest pains today.  Denies associated cardiac or respiratory symptoms.  Never smoked cigarettes, but was exposed to secondhand smoke.  She also has psychologic stressors with depression and anxiety, she stays in a Motel currently, with her son. -Recommend establishing and following up with outpatient provider

## 2021-12-06 NOTE — ED Notes (Signed)
ED PA at [facility] 

## 2024-03-04 IMAGING — DX DG CHEST 1V PORT
1 series · 1 of 1 positions shown · non-contrast
Comparison: 12/02/2020

CLINICAL DATA: Chest pain and shortness of breath.

EXAM:
PORTABLE CHEST 1 VIEW

[chest ap]
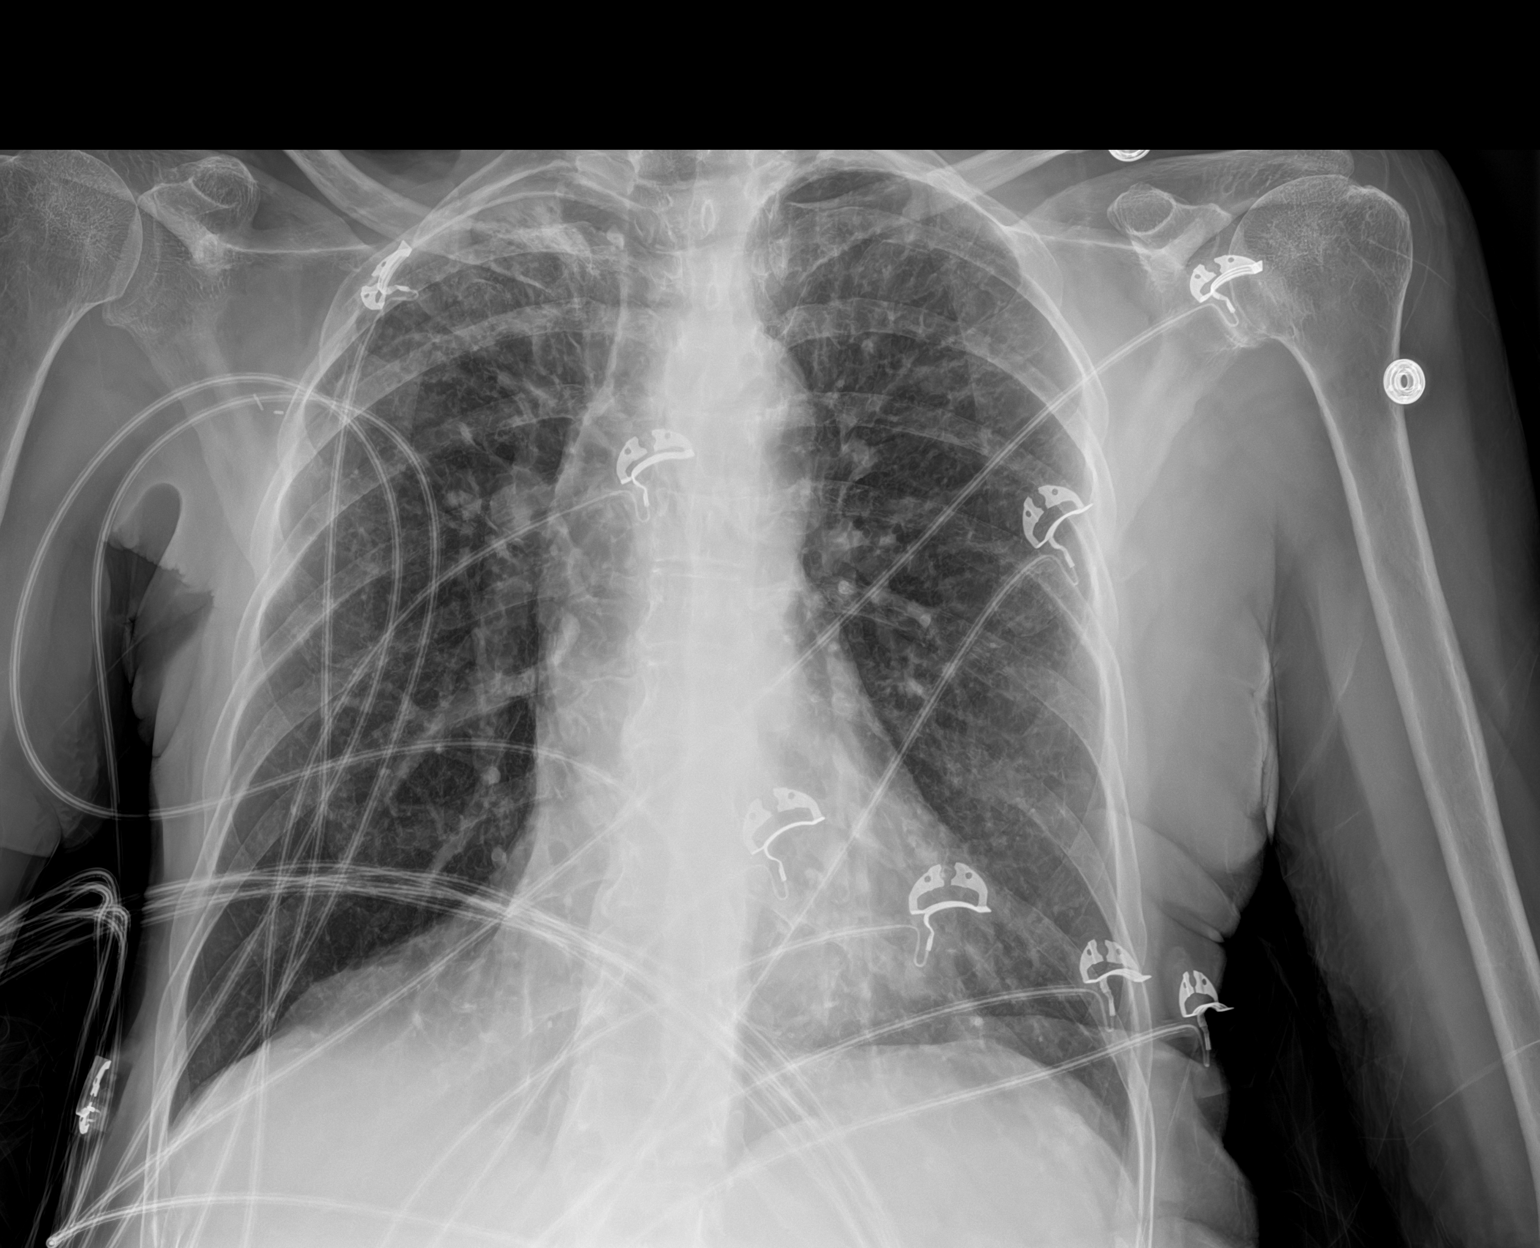

[1 of 1 positions shown; findings below may reference images not displayed]

FINDINGS: The lungs are clear without focal pneumonia, edema, pneumothorax or
pleural effusion. Stable scarring right lung apex. The
cardiopericardial silhouette is within normal limits for size. The
visualized bony structures of the thorax are unremarkable. Telemetry
leads overlie the chest.
IMPRESSION: Hyperexpansion with chronic interstitial changes and right apical
scarring. No acute cardiopulmonary findings.
